# Patient Record
Sex: Female | Born: 1980 | Hispanic: Yes | Marital: Single | Smoking: Never smoker
Health system: Southern US, Community
[De-identification: ages and names within clinical notes are randomized; demographics above are authoritative.]

## PROBLEM LIST (undated history)

## (undated) DIAGNOSIS — O24419 Gestational diabetes mellitus in pregnancy, unspecified control: Secondary | ICD-10-CM

## (undated) DIAGNOSIS — N200 Calculus of kidney: Secondary | ICD-10-CM

---

## 2015-12-22 ENCOUNTER — Emergency Department
Admission: EM | Admit: 2015-12-22 | Discharge: 2015-12-22 | Disposition: A | Discharge status: Home / Self Care | Payer: Self-pay | Attending: Emergency Medicine | Admitting: Emergency Medicine

## 2015-12-22 ENCOUNTER — Encounter: Payer: Self-pay | Admitting: Emergency Medicine

## 2015-12-22 DIAGNOSIS — R42 Dizziness and giddiness: Secondary | ICD-10-CM | POA: Insufficient documentation

## 2015-12-22 DIAGNOSIS — R1110 Vomiting, unspecified: Secondary | ICD-10-CM | POA: Insufficient documentation

## 2015-12-22 HISTORY — DX: Calculus of kidney: N20.0

## 2015-12-22 LAB — COMPREHENSIVE METABOLIC PANEL
ALT: 17 U/L (ref 14–54)
ANION GAP: 6 (ref 5–15)
AST: 18 U/L (ref 15–41)
Albumin: 4 g/dL (ref 3.5–5.0)
Alkaline Phosphatase: 65 U/L (ref 38–126)
BILIRUBIN TOTAL: 0.6 mg/dL (ref 0.3–1.2)
BUN: 8 mg/dL (ref 6–20)
CO2: 27 mmol/L (ref 22–32)
Calcium: 8.6 mg/dL — ABNORMAL LOW (ref 8.9–10.3)
Chloride: 105 mmol/L (ref 101–111)
Creatinine, Ser: 0.66 mg/dL (ref 0.44–1.00)
Glucose, Bld: 137 mg/dL — ABNORMAL HIGH (ref 65–99)
POTASSIUM: 4 mmol/L (ref 3.5–5.1)
Sodium: 138 mmol/L (ref 135–145)
TOTAL PROTEIN: 7.3 g/dL (ref 6.5–8.1)

## 2015-12-22 LAB — URINALYSIS COMPLETE WITH MICROSCOPIC (ARMC ONLY)
Bilirubin Urine: NEGATIVE
GLUCOSE, UA: NEGATIVE mg/dL
Ketones, ur: NEGATIVE mg/dL
LEUKOCYTES UA: NEGATIVE
NITRITE: NEGATIVE
Protein, ur: 100 mg/dL — AB
SPECIFIC GRAVITY, URINE: 1.023 (ref 1.005–1.030)
pH: 5 (ref 5.0–8.0)

## 2015-12-22 LAB — CBC
HEMATOCRIT: 36.6 % (ref 35.0–47.0)
HEMOGLOBIN: 12.4 g/dL (ref 12.0–16.0)
MCH: 28.5 pg (ref 26.0–34.0)
MCHC: 34 g/dL (ref 32.0–36.0)
MCV: 84 fL (ref 80.0–100.0)
Platelets: 310 10*3/uL (ref 150–440)
RBC: 4.36 MIL/uL (ref 3.80–5.20)
RDW: 13.9 % (ref 11.5–14.5)
WBC: 8.1 10*3/uL (ref 3.6–11.0)

## 2015-12-22 LAB — POCT PREGNANCY, URINE: PREG TEST UR: NEGATIVE

## 2015-12-22 MED ORDER — DIAZEPAM 5 MG PO TABS: 5.0000 mg | ORAL_TABLET | Freq: Three times a day (TID) | ORAL | 0 refills | Status: DC | PRN

## 2015-12-22 MED ORDER — MECLIZINE HCL 25 MG PO TABS
50.0000 mg | ORAL_TABLET | Freq: Once | ORAL | Status: AC
  Administered 2015-12-22: 50 mg via ORAL
  Filled 2015-12-22: qty 2

## 2015-12-22 MED ORDER — ONDANSETRON 4 MG PO TBDP
4.0000 mg | ORAL_TABLET | Freq: Once | ORAL | Status: AC
  Administered 2015-12-22: 4 mg via ORAL
  Filled 2015-12-22: qty 1

## 2015-12-22 MED ORDER — DIAZEPAM 5 MG PO TABS
5.0000 mg | ORAL_TABLET | Freq: Once | ORAL | Status: AC
  Administered 2015-12-22: 5 mg via ORAL
  Filled 2015-12-22: qty 1

## 2015-12-22 MED ORDER — MECLIZINE HCL 25 MG PO TABS
25.0000 mg | ORAL_TABLET | Freq: Three times a day (TID) | ORAL | 1 refills | Status: DC | PRN
Start: 2015-12-22 — End: 2019-12-12

## 2015-12-22 NOTE — ED Notes (Signed)
No change in symptoms, pt waiting in lobby

## 2015-12-22 NOTE — ED Notes (Signed)
D/c done with interpreter at bedside  

## 2015-12-22 NOTE — ED Provider Notes (Signed)
[facility] Provider Note        Time seen: ----------------------------------------- 3:03 PM on 12/22/2015 -----------------------------------------    I have reviewed the triage vital signs and the nursing notes.   HISTORY  Chief Complaint Dizziness and Emesis    HPI Regina Morris is a 35 y.o. female who presents to ER for dizziness and vomiting. Patient continues to have spinning and she describes as dizziness. She vomited another time as well. She has had symptoms on and off for several days. Movement seems to make it worse. She denies recent illness, she had some upper abdominal pain with vomiting associated with this. She denies fevers or chills.   Past Medical History:  Diagnosis Date  . Kidney stone     There are no active problems to display for this patient.   No past surgical history on file.  Allergies Penicillins  Social History Social History  Substance Use Topics  . Smoking status: Never Smoker  . Smokeless tobacco: Never Used  . Alcohol use No    Review of Systems Constitutional: Negative for fever. Cardiovascular: Negative for chest pain. Respiratory: Negative for shortness of breath. Gastrointestinal: Negative for abdominal pain,Positive for vomiting Genitourinary: Negative for dysuria. Musculoskeletal: Negative for back pain. Skin: Negative for rash. Neurological: Negative for headaches, focal weakness or numbness.Positive for dizziness  10-point ROS otherwise negative.  ____________________________________________   PHYSICAL EXAM:  VITAL SIGNS: ED Triage Vitals  Enc Vitals Group     BP 12/22/15 1043 (!) 144/81     Pulse Rate 12/22/15 1043 (!) 59     Resp 12/22/15 1043 18     Temp 12/22/15 1043 98.4 F (36.9 C)     Temp Source 12/22/15 1043 Oral     SpO2 12/22/15 1043 99 %     Weight 12/22/15 1044 170 lb (77.1 kg)     Height 12/22/15 1044 5\' 8"  (1.727 m)     Head  Circumference --      Peak Flow --      Pain Score 12/22/15 1046 0     Pain Loc --      Pain Edu? --      Excl. in [place]? --     Constitutional: Alert and oriented. Well appearing and in no distress. Eyes: Conjunctivae are normal. PERRL. Normal extraocular movements. ENT   Head: Normocephalic and atraumatic.   Nose: No congestion/rhinnorhea.   Mouth/Throat: Mucous membranes are moist.   Neck: No stridor. Cardiovascular: Normal rate, regular rhythm. No murmurs, rubs, or gallops. Respiratory: Normal respiratory effort without tachypnea nor retractions. Breath sounds are clear and equal bilaterally. No wheezes/rales/rhonchi. Gastrointestinal: Soft and nontender. Normal bowel sounds Musculoskeletal: Nontender with normal range of motion in all extremities. No lower extremity tenderness nor edema. Neurologic:  Normal speech and language. No gross focal neurologic deficits are appreciated.  Skin:  Skin is warm, dry and intact. No rash noted. Psychiatric: Mood and affect are normal. Speech and behavior are normal.  ____________________________________________  ED COURSE:  Pertinent labs & imaging results that were available during my care of the patient were reviewed by me and considered in my medical decision making (see chart for details). Clinical Course  Patient is in no distress, clinically with vertigo. She will receive meclizine and Valium.  Procedures ____________________________________________   LABS (pertinent positives/negatives)  Labs Reviewed  COMPREHENSIVE METABOLIC PANEL - Abnormal; Notable for the following:       Result Value   Glucose, Bld 137 (*)  Calcium 8.6 (*)    All other components within normal limits  URINALYSIS COMPLETEWITH MICROSCOPIC ([facility] ONLY) - Abnormal; Notable for the following:    Color, Urine YELLOW (*)    APPearance CLOUDY (*)    Hgb urine dipstick 3+ (*)    Protein, ur 100 (*)    Bacteria, UA RARE (*)    Squamous Epithelial /  LPF 0-5 (*)    All other components within normal limits  CBC  POC URINE PREG, ED  POCT PREGNANCY, URINE   ____________________________________________  FINAL ASSESSMENT AND PLAN  Vertigo  Plan: Patient with labs as dictated above. Patient is in no distress, symptoms have improved. She'll be discharged with meclizine and Valium and referred to ENT for follow-up.   Emily Filbert, MD   Note: This dictation was prepared with Dragon dictation. Any transcriptional errors that result from this process are unintentional    Emily Filbert, MD 12/22/15 [phone]

## 2015-12-22 NOTE — ED Triage Notes (Signed)
Saturday started with dizziness and vomited.  Continues to have spinning dizziness. Vomited another time as well.

## 2018-12-26 ENCOUNTER — Emergency Department
Admission: EM | Admit: 2018-12-26 | Discharge: 2018-12-26 | Disposition: A | Discharge status: Home / Self Care | Payer: Self-pay | Attending: Emergency Medicine | Admitting: Emergency Medicine

## 2018-12-26 ENCOUNTER — Encounter: Payer: Self-pay | Admitting: *Deleted

## 2018-12-26 ENCOUNTER — Other Ambulatory Visit: Payer: Self-pay

## 2018-12-26 DIAGNOSIS — J069 Acute upper respiratory infection, unspecified: Secondary | ICD-10-CM | POA: Insufficient documentation

## 2018-12-26 DIAGNOSIS — F17200 Nicotine dependence, unspecified, uncomplicated: Secondary | ICD-10-CM | POA: Insufficient documentation

## 2018-12-26 DIAGNOSIS — Z20828 Contact with and (suspected) exposure to other viral communicable diseases: Secondary | ICD-10-CM | POA: Insufficient documentation

## 2018-12-26 MED ORDER — BENZONATATE 100 MG PO CAPS
100.0000 mg | ORAL_CAPSULE | Freq: Three times a day (TID) | ORAL | 0 refills | Status: AC | PRN
Start: 2018-12-26 — End: 2019-01-02

## 2018-12-26 NOTE — ED Notes (Signed)
E-signature not working at this time. Pt verbalized understanding of D/C instructions, prescriptions and follow up care with no further questions at this time. Pt in NAD and ambulatory at time of D/C.  

## 2018-12-26 NOTE — ED Provider Notes (Signed)
[facility] Emergency Department Provider Note  ____________________________________________  Time seen: Approximately 11:04 PM  I have reviewed the triage vital signs and the nursing notes.   HISTORY  Chief Complaint Cough    HPI Regina Morris is a 38 y.o. female presents to the emergency department with rhinorrhea, nasal congestion, sporadic, nonproductive cough and pharyngitis for the past 2 to 3 days.  Patient has numerous potential sick contacts.  She denies chest pain, chest tightness, shortness of breath or abdominal pain.  Increased work of breathing at home.  No other alleviating measures have been attempted.        Past Medical History:  Diagnosis Date  . Kidney stone     There are no active problems to display for this patient.    Prior to Admission medications   Medication Sig Start Date End Date Taking? Authorizing Provider  benzonatate (TESSALON PERLES) 100 MG capsule Take 1 capsule (100 mg total) by mouth 3 (three) times daily as needed for up to 7 days for cough. 12/26/18 01/02/19  Lannie Fields, PA-C  diazepam (VALIUM) 5 MG tablet Take 1 tablet (5 mg total) by mouth every 8 (eight) hours as needed (to be taken with meclizine for vertigo). 12/22/15   Earleen Newport, MD  meclizine (ANTIVERT) 25 MG tablet Take 1 tablet (25 mg total) by mouth 3 (three) times daily as needed for dizziness or nausea. 12/22/15   Earleen Newport, MD    Allergies Penicillins  No family history on file.  Social History Social History   Tobacco Use  . Smoking status: Current Every Day Smoker  . Smokeless tobacco: Never Used  Substance Use Topics  . Alcohol use: No  . Drug use: Not on file      Review of Systems  Constitutional: Patient has fever.  Eyes: No visual changes. No discharge ENT: Patient has congestion.  Cardiovascular: no chest pain. Respiratory: Patient has cough.  Gastrointestinal: No abdominal pain.  No nausea,  no vomiting. Patient had diarrhea.  Genitourinary: Negative for dysuria. No hematuria Musculoskeletal: Patient has myalgias.  Skin: Negative for rash, abrasions, lacerations, ecchymosis. Neurological: Patient has headache, no focal weakness or numbness.     ____________________________________________   PHYSICAL EXAM:  VITAL SIGNS: ED Triage Vitals  Enc Vitals Group     BP 12/26/18 2216 136/87     Pulse Rate 12/26/18 2216 91     Resp 12/26/18 2216 18     Temp 12/26/18 2216 98.4 F (36.9 C)     Temp Source 12/26/18 2216 Oral     SpO2 12/26/18 2216 100 %     Weight 12/26/18 2217 165 lb (74.8 kg)     Height 12/26/18 2217 5\' 4"  (1.626 m)     Head Circumference --      Peak Flow --      Pain Score 12/26/18 2217 6     Pain Loc --      Pain Edu? --      Excl. in [place]? --     Constitutional: Alert and oriented. Patient is lying supine. Eyes: Conjunctivae are normal. PERRL. EOMI. Head: Atraumatic. ENT:      Ears: Tympanic membranes are mildly injected with mild effusion bilaterally.       Nose: No congestion/rhinnorhea.      Mouth/Throat: Mucous membranes are moist. Posterior pharynx is mildly erythematous.  Hematological/Lymphatic/Immunilogical: No cervical lymphadenopathy.  Cardiovascular: Normal rate, regular rhythm. Normal S1 and S2.  Good peripheral  circulation. Respiratory: Normal respiratory effort without tachypnea or retractions. Lungs CTAB. Good air entry to the bases with no decreased or absent breath sounds. Gastrointestinal: Bowel sounds 4 quadrants. Soft and nontender to palpation. No guarding or rigidity. No palpable masses. No distention. No CVA tenderness. Musculoskeletal: Full range of motion to all extremities. No gross deformities appreciated. Neurologic:  Normal speech and language. No gross focal neurologic deficits are appreciated.  Skin:  Skin is warm, dry and intact. No rash noted. Psychiatric: Mood and affect are normal. Speech and behavior are normal.  Patient exhibits appropriate insight and judgement.    ____________________________________________   LABS (all labs ordered are listed, but only abnormal results are displayed)  Labs Reviewed  SARS CORONAVIRUS 2 (TAT 6-24 HRS)   ____________________________________________  EKG   ____________________________________________  RADIOLOGY   No results found.  ____________________________________________    PROCEDURES  Procedure(s) performed:    Procedures    Medications - No data to display   ____________________________________________   INITIAL IMPRESSION / ASSESSMENT AND PLAN / ED COURSE  Pertinent labs & imaging results that were available during my care of the patient were reviewed by me and considered in my medical decision making (see chart for details).  Review of the [place] CSRS was performed in accordance of the [facility] prior to dispensing any controlled drugs.           Assessment and plan Viral URI with cough 38 year old female presents to the emergency department with rhinorrhea, nasal congestion and sporadic nonproductive cough for the past 2 to 3 days.  Vital signs are reassuring at triage.  On physical exam, patient had no increased work of breathing.  No adventitious lung sounds were auscultated.  Differential diagnosis includes COVID-19 versus unspecified viral URI.  Rest and hydration were encouraged at home.  Patient was advised to take Tylenol and ibuprofen alternating for fever.  COVID-19 results are pending at this time.  Return precautions were given.  All patient questions were answered.  Regina Morris Texta was evaluated in Emergency Department on 12/26/2018 for the symptoms described in the history of present illness. She was evaluated in the context of the global COVID-19 pandemic, which necessitated consideration that the patient might be at risk for infection with the SARS-CoV-2 virus that causes COVID-19. Institutional protocols and  algorithms that pertain to the evaluation of patients at risk for COVID-19 are in a state of rapid change based on information released by regulatory bodies including the CDC and federal and state organizations. These policies and algorithms were followed during the patient's care in the ED.    ____________________________________________  FINAL CLINICAL IMPRESSION(S) / ED DIAGNOSES  Final diagnoses:  Viral URI with cough      NEW MEDICATIONS STARTED DURING THIS VISIT:  ED Discharge Orders         Ordered    benzonatate (TESSALON PERLES) 100 MG capsule  3 times daily PRN     12/26/18 2251              This chart was dictated using voice recognition software/Dragon. Despite best efforts to proofread, errors can occur which can change the meaning. Any change was purely unintentional.    Orvil Feil, PA-C 12/26/18 2306    Sharman Cheek, MD 12/31/18 [phone]

## 2018-12-26 NOTE — ED Triage Notes (Signed)
Pt has fever, cough and runny nose for 3 days.  Pt also reports a sore throat.  No sob.  cig smoker.    Pt alert.  Speech clear.

## 2018-12-27 LAB — SARS CORONAVIRUS 2 (TAT 6-24 HRS): SARS Coronavirus 2: NEGATIVE

## 2019-12-12 ENCOUNTER — Inpatient Hospital Stay
Admission: EM | Admit: 2019-12-12 | Discharge: 2019-12-15 | DRG: 872 | Disposition: A | Discharge status: Home / Self Care | Payer: Medicaid Other | Attending: Internal Medicine | Admitting: Internal Medicine

## 2019-12-12 ENCOUNTER — Emergency Department: Payer: Medicaid Other

## 2019-12-12 ENCOUNTER — Other Ambulatory Visit: Payer: Self-pay

## 2019-12-12 ENCOUNTER — Encounter: Payer: Self-pay | Admitting: Emergency Medicine

## 2019-12-12 DIAGNOSIS — E871 Hypo-osmolality and hyponatremia: Secondary | ICD-10-CM | POA: Diagnosis present

## 2019-12-12 DIAGNOSIS — N10 Acute tubulo-interstitial nephritis: Secondary | ICD-10-CM

## 2019-12-12 DIAGNOSIS — A419 Sepsis, unspecified organism: Secondary | ICD-10-CM | POA: Diagnosis present

## 2019-12-12 DIAGNOSIS — Z20822 Contact with and (suspected) exposure to covid-19: Secondary | ICD-10-CM | POA: Diagnosis present

## 2019-12-12 DIAGNOSIS — Z833 Family history of diabetes mellitus: Secondary | ICD-10-CM

## 2019-12-12 DIAGNOSIS — E876 Hypokalemia: Secondary | ICD-10-CM | POA: Diagnosis present

## 2019-12-12 DIAGNOSIS — Z880 Allergy status to penicillin: Secondary | ICD-10-CM

## 2019-12-12 DIAGNOSIS — D649 Anemia, unspecified: Secondary | ICD-10-CM | POA: Diagnosis present

## 2019-12-12 DIAGNOSIS — I10 Essential (primary) hypertension: Secondary | ICD-10-CM | POA: Diagnosis present

## 2019-12-12 DIAGNOSIS — E1165 Type 2 diabetes mellitus with hyperglycemia: Secondary | ICD-10-CM | POA: Diagnosis present

## 2019-12-12 DIAGNOSIS — R509 Fever, unspecified: Secondary | ICD-10-CM | POA: Diagnosis present

## 2019-12-12 DIAGNOSIS — Z87442 Personal history of urinary calculi: Secondary | ICD-10-CM

## 2019-12-12 DIAGNOSIS — Z8632 Personal history of gestational diabetes: Secondary | ICD-10-CM | POA: Diagnosis not present

## 2019-12-12 DIAGNOSIS — R739 Hyperglycemia, unspecified: Secondary | ICD-10-CM

## 2019-12-12 DIAGNOSIS — N12 Tubulo-interstitial nephritis, not specified as acute or chronic: Secondary | ICD-10-CM

## 2019-12-12 DIAGNOSIS — E878 Other disorders of electrolyte and fluid balance, not elsewhere classified: Secondary | ICD-10-CM

## 2019-12-12 HISTORY — DX: Gestational diabetes mellitus in pregnancy, unspecified control: O24.419

## 2019-12-12 LAB — URINALYSIS, COMPLETE (UACMP) WITH MICROSCOPIC
Bilirubin Urine: NEGATIVE
Glucose, UA: >=500 mg/dL — AB
Ketones, ur: NEGATIVE mg/dL
Nitrite: POSITIVE — AB
Protein, ur: >=300 mg/dL — AB
Specific Gravity, Urine: 1.021 (ref 1.005–1.030)
WBC, UA: >50 WBC/hpf — ABNORMAL HIGH (ref 0–5)
pH: 7 (ref 5.0–8.0)

## 2019-12-12 LAB — CBC WITH DIFFERENTIAL/PLATELET
Abs Immature Granulocytes: 0.05 10*3/uL (ref 0.00–0.07)
Basophils Absolute: 0 10*3/uL (ref 0.0–0.1)
Basophils Relative: 0 %
Eosinophils Absolute: 0 10*3/uL (ref 0.0–0.5)
Eosinophils Relative: 0 %
HCT: 28.9 % — ABNORMAL LOW (ref 36.0–46.0)
Hemoglobin: 8.7 g/dL — ABNORMAL LOW (ref 12.0–15.0)
Immature Granulocytes: 1 %
Lymphocytes Relative: 9 %
Lymphs Abs: 1 10*3/uL (ref 0.7–4.0)
MCH: 17.8 pg — ABNORMAL LOW (ref 26.0–34.0)
MCHC: 30.1 g/dL (ref 30.0–36.0)
MCV: 59.1 fL — ABNORMAL LOW (ref 80.0–100.0)
Monocytes Absolute: 0.7 10*3/uL (ref 0.1–1.0)
Monocytes Relative: 6 %
Neutro Abs: 9.2 10*3/uL — ABNORMAL HIGH (ref 1.7–7.7)
Neutrophils Relative %: 84 %
Platelets: 328 10*3/uL (ref 150–400)
RBC: 4.89 MIL/uL (ref 3.87–5.11)
RDW: 18 % — ABNORMAL HIGH (ref 11.5–15.5)
WBC: 10.9 10*3/uL — ABNORMAL HIGH (ref 4.0–10.5)
nRBC: 0 % (ref 0.0–0.2)

## 2019-12-12 LAB — POCT PREGNANCY, URINE: Preg Test, Ur: NEGATIVE

## 2019-12-12 LAB — COMPREHENSIVE METABOLIC PANEL
ALT: 18 U/L (ref 0–44)
AST: 24 U/L (ref 15–41)
Albumin: 2.9 g/dL — ABNORMAL LOW (ref 3.5–5.0)
Alkaline Phosphatase: 73 U/L (ref 38–126)
Anion gap: 11 (ref 5–15)
BUN: 8 mg/dL (ref 6–20)
CO2: 23 mmol/L (ref 22–32)
Calcium: 8.1 mg/dL — ABNORMAL LOW (ref 8.9–10.3)
Chloride: 97 mmol/L — ABNORMAL LOW (ref 98–111)
Creatinine, Ser: 0.81 mg/dL (ref 0.44–1.00)
GFR calc Af Amer: >60 mL/min (ref >60)
GFR calc non Af Amer: >60 mL/min (ref >60)
Glucose, Bld: 312 mg/dL — ABNORMAL HIGH (ref 70–99)
Potassium: 3.1 mmol/L — ABNORMAL LOW (ref 3.5–5.1)
Sodium: 131 mmol/L — ABNORMAL LOW (ref 135–145)
Total Bilirubin: 0.5 mg/dL (ref 0.3–1.2)
Total Protein: 6.7 g/dL (ref 6.5–8.1)

## 2019-12-12 LAB — LACTIC ACID, PLASMA
Lactic Acid, Venous: 1.7 mmol/L (ref 0.5–1.9)
Lactic Acid, Venous: 1.1 mmol/L (ref 0.5–1.9)

## 2019-12-12 LAB — RESPIRATORY PANEL BY RT PCR (FLU A&B, COVID)
Influenza A by PCR: NEGATIVE
Influenza B by PCR: NEGATIVE
SARS Coronavirus 2 by RT PCR: NEGATIVE

## 2019-12-12 LAB — TROPONIN I (HIGH SENSITIVITY)
Troponin I (High Sensitivity): 8 ng/L (ref <18)
Troponin I (High Sensitivity): 8 ng/L (ref <18)

## 2019-12-12 LAB — FOLATE: Folate: 15.4 ng/mL (ref >5.9)

## 2019-12-12 LAB — IRON AND TIBC
Iron: 7 ug/dL — ABNORMAL LOW (ref 28–170)
Saturation Ratios: 2 % — ABNORMAL LOW (ref 10.4–31.8)
TIBC: 343 ug/dL (ref 250–450)
UIBC: 336 ug/dL

## 2019-12-12 LAB — GLUCOSE, CAPILLARY: Glucose-Capillary: 274 mg/dL — ABNORMAL HIGH (ref 70–99)

## 2019-12-12 LAB — FERRITIN: Ferritin: 13 ng/mL (ref 11–307)

## 2019-12-12 IMAGING — DX DG CHEST 1V PORT
1 series · 1 of 1 positions shown · non-contrast
Comparison: None.

CLINICAL DATA: Cough and fever

EXAM:
PORTABLE CHEST 1 VIEW

[chest ap]
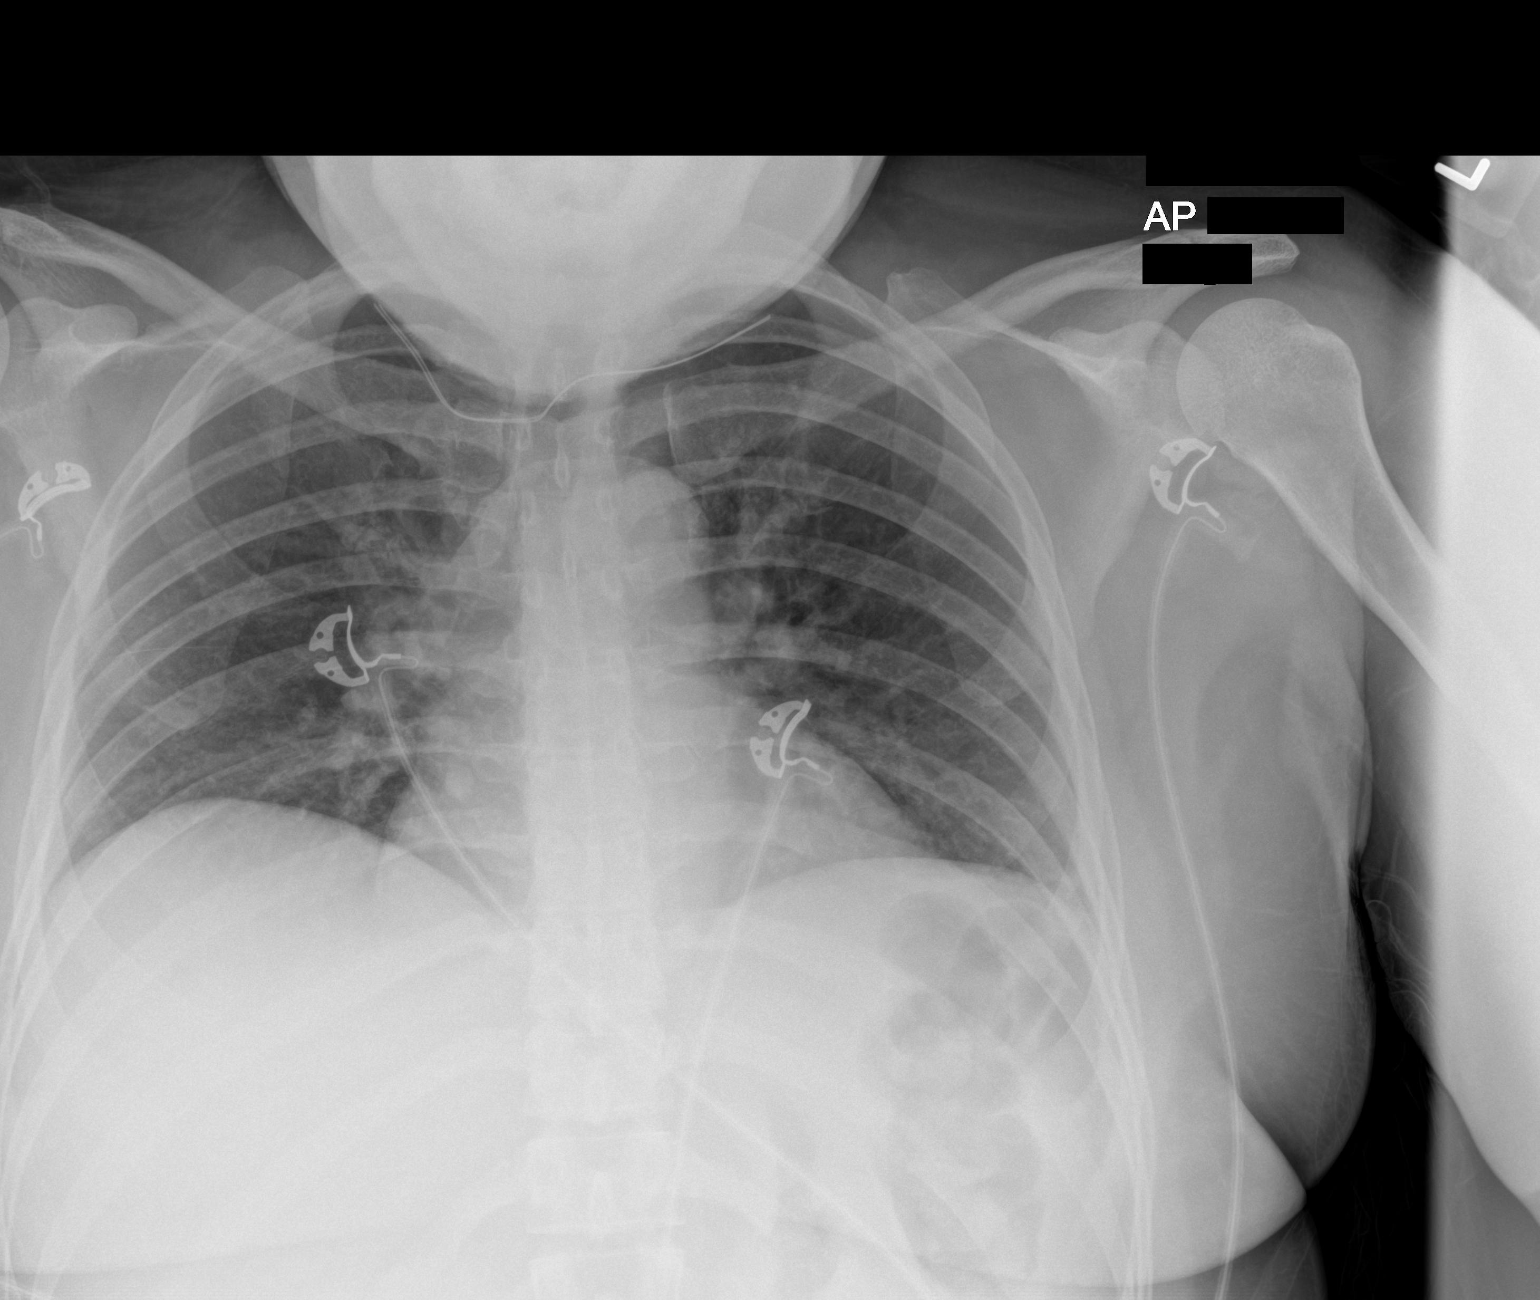

[1 of 1 positions shown; findings below may reference images not displayed]

FINDINGS: Diminished lung volumes. No focal consolidation, pleural effusion or
pneumothorax. Normal cardiomediastinal silhouette
IMPRESSION: Low lung volumes. No focal pulmonary infiltrate.

## 2019-12-12 IMAGING — CT CT RENAL STONE PROTOCOL
3 of 4 series · 10 of 46 positions shown, 15 images · non-contrast
Comparison: None.

CLINICAL DATA: Flank pain, stone disease suspected, pregnant
patient

EXAM:
CT ABDOMEN AND PELVIS WITHOUT CONTRAST
TECHNIQUE: Multidetector CT imaging of the abdomen and pelvis was performed
following the standard protocol without IV contrast.

[Series 2: lung bases · axial · 0.79mm/px · z∈[-60,+55]mm · 6 of 33 slices shown, 11 images]
[im 5/33  soft-tissue]
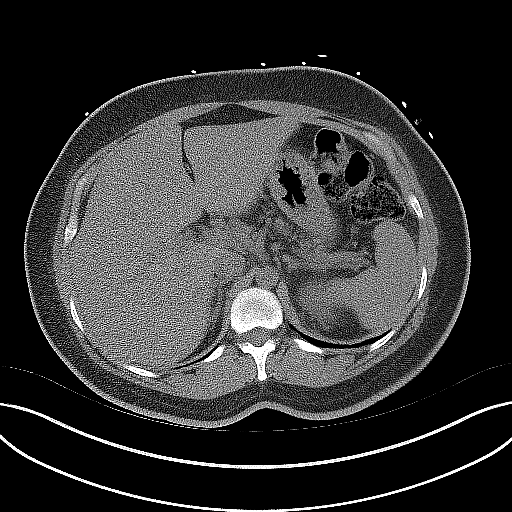
[im 5/33  bone]
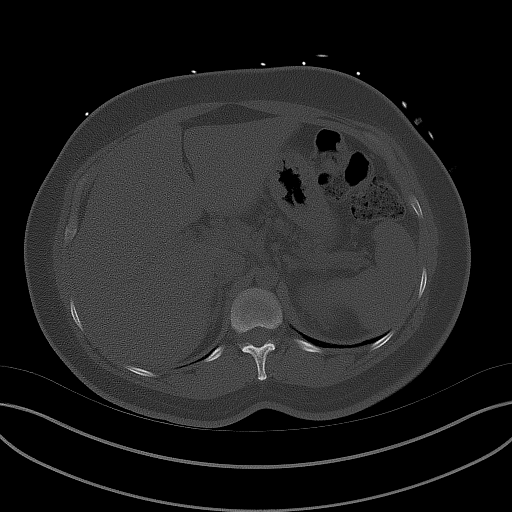
[im 10/33  soft-tissue]
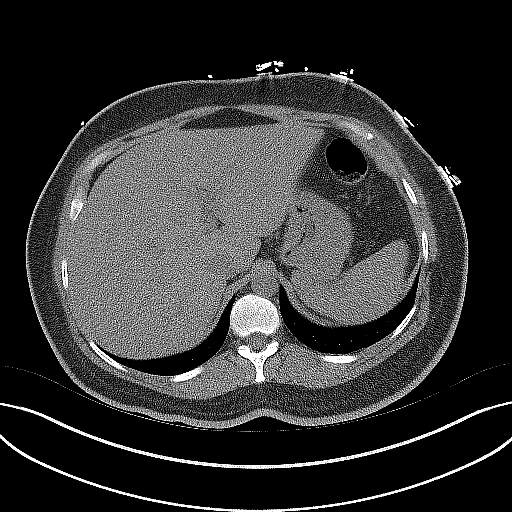
[im 14/33  soft-tissue]
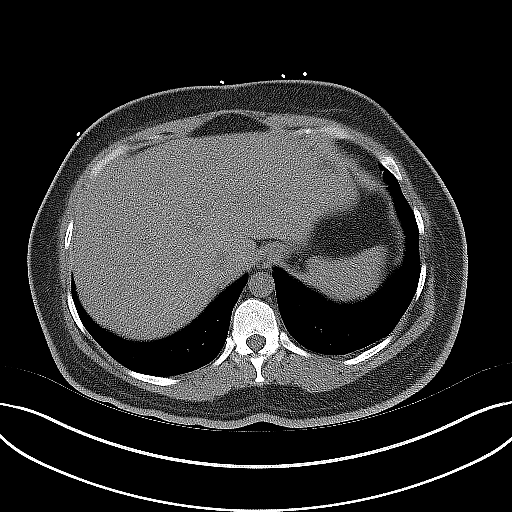
[im 14/33  lung]
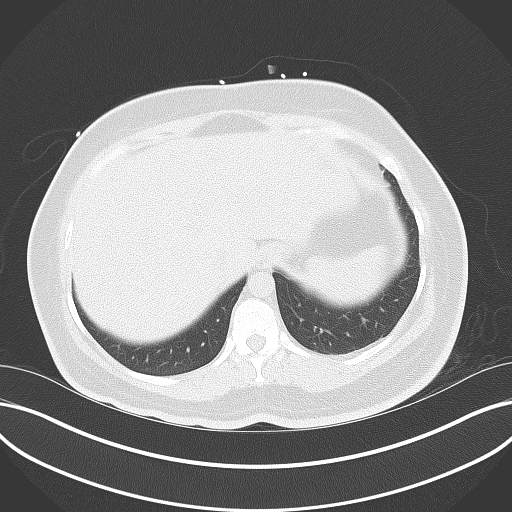
[im 19/33  soft-tissue]
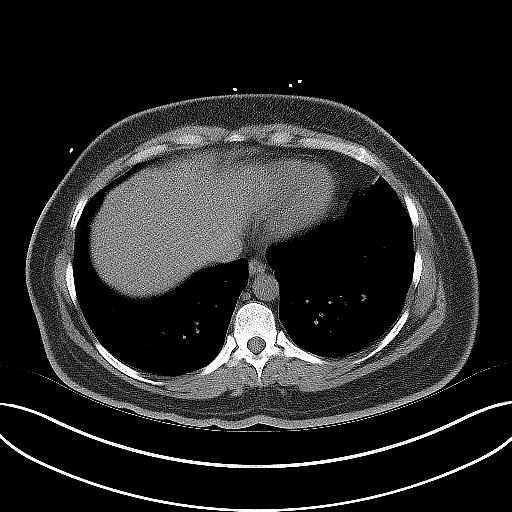
[im 19/33  lung]
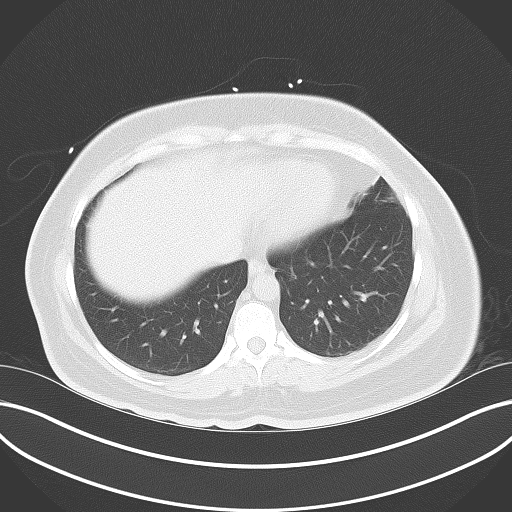
[im 23/33  soft-tissue]
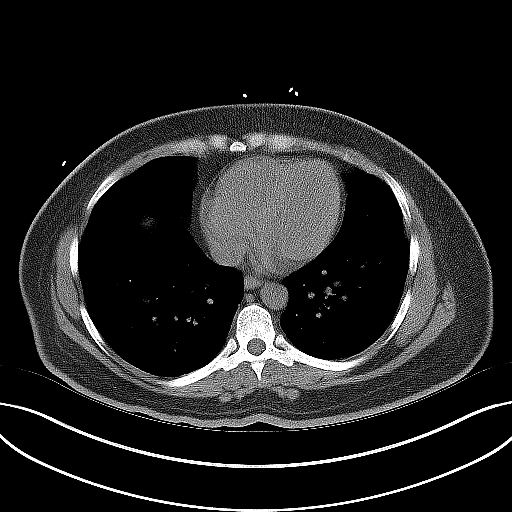
[im 23/33  lung]
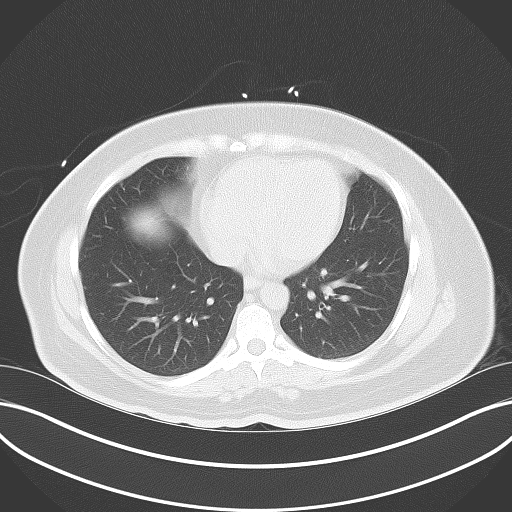
[im 28/33  soft-tissue]
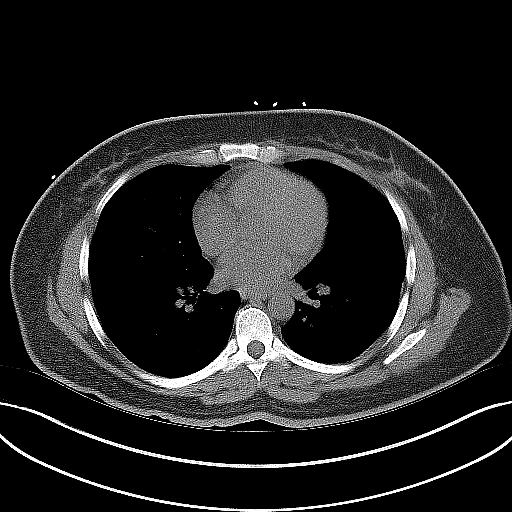
[im 28/33  lung]
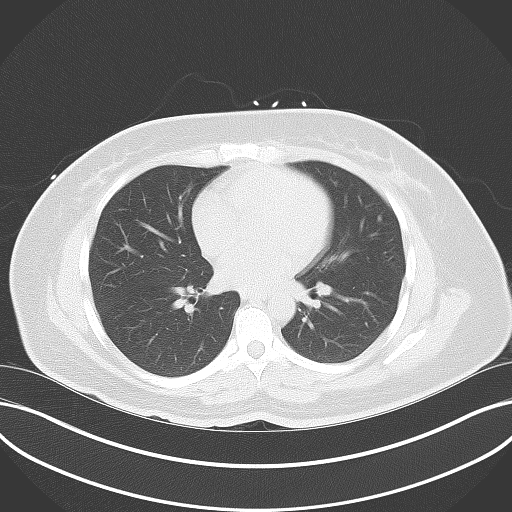

[Series 5: coronal · coronal · 0.83mm/px · 3 of 150 slices shown]
[im 50/150  soft-tissue]
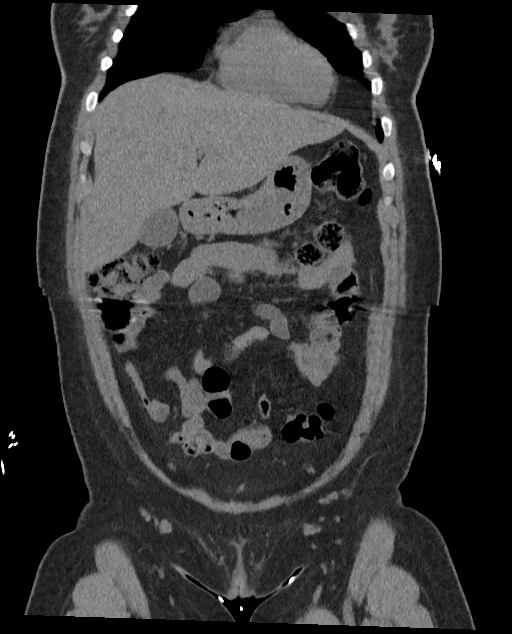
[im 67/150  soft-tissue]
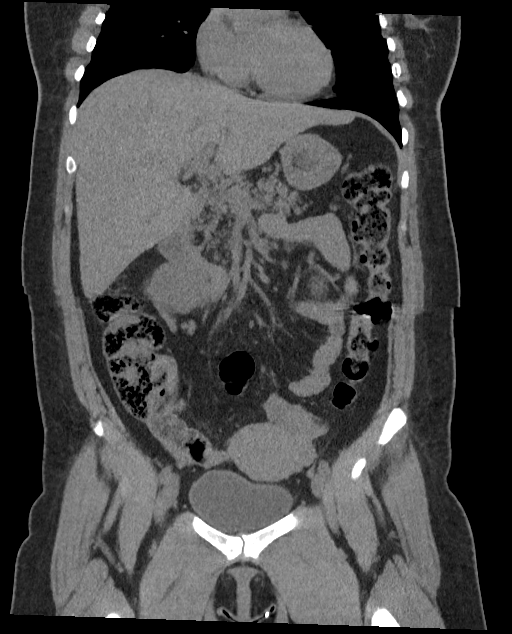
[im 83/150  soft-tissue]
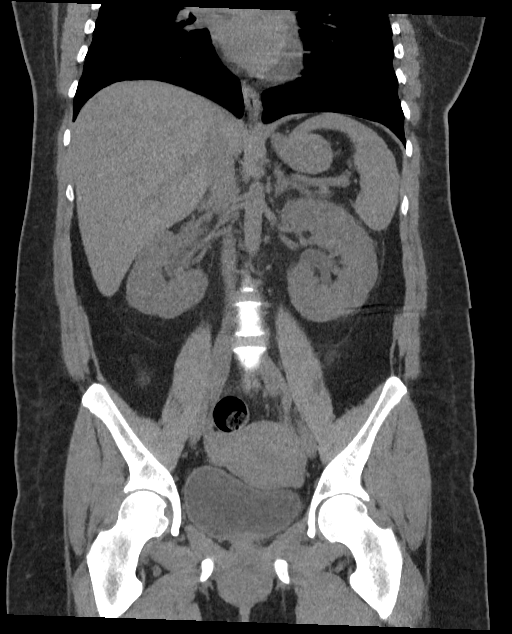

[Series 6: sagittal · sagittal · 0.58mm/px · 1 of 212 slices shown]
[im 71/212  soft-tissue]
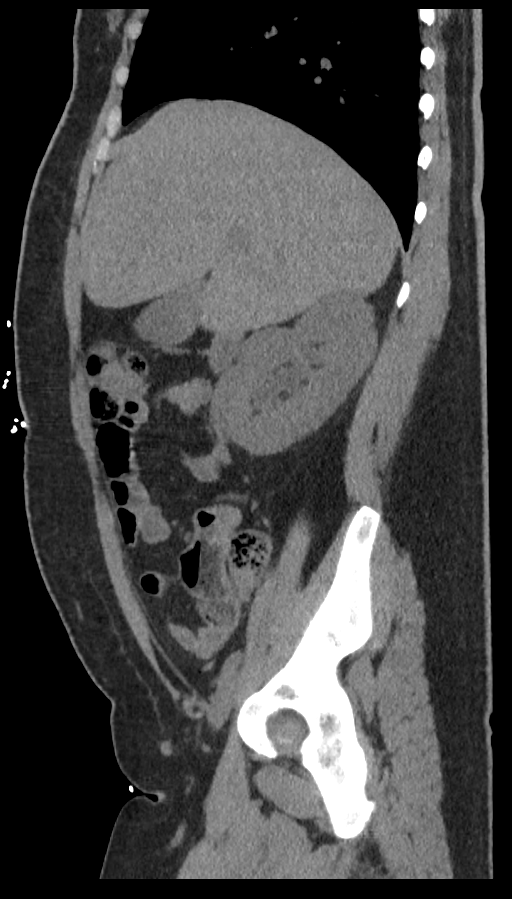

[10 of 46 positions shown; findings below may reference images not displayed]

FINDINGS: Lower chest: Lung bases are clear. Normal heart size. No pericardial
effusion.

Hepatobiliary: No visible focal liver lesions on this unenhanced CT.
Smooth liver surface contour. Normal hepatic attenuation.
Gallbladder and biliary tree are unremarkable.

Pancreas: Partial fatty replacement of the pancreas. No pancreatic
ductal dilatation or surrounding inflammatory changes.

Spleen: Normal in size. No concerning splenic lesions.

Adrenals/Urinary Tract: Normal adrenal glands. No visible or contour
deforming renal lesions are seen. Bilateral perinephric stranding
with a mildly edematous appearance of both kidneys. There is slight
prominence of the collecting systems bilaterally without visible
calculus or frank hydronephrosis. Mild circumferential bladder wall
thickening and perivesicular hazy stranding is noted as well. No
visible bladder calculi or debris.

Stomach/Bowel: Distal esophagus, stomach and duodenal sweep are
unremarkable. No small bowel wall thickening or dilatation. No
evidence of obstruction. A normal appendix is visualized. No colonic
dilatation or wall thickening.

Vascular/Lymphatic: No significant vascular findings are present.
Few slightly prominent clustered though nonenlarged lymph nodes are
present in the right lower quadrant. No pathologically enlarged
nodes are present in the abdomen or pelvis.

Reproductive: Normal appearance of the uterus and adnexal
structures.

Other: No abdominopelvic free fluid or free gas. No bowel containing
hernias.

Musculoskeletal: No acute osseous abnormality or suspicious osseous
lesion.
IMPRESSION: 1. Bilateral perinephric stranding with a mildly edematous
appearance of both kidneys. There is slight prominence of the
collecting systems bilaterally without visible calculus or frank
hydronephrosis. Mild circumferential bladder wall thickening and
perivesicular hazy stranding is noted as well. Findings are
concerning for ascending urinary tract infection/pyelonephritis.
Correlate with urinalysis.
2. Few slightly prominent clustered though nonenlarged lymph nodes
in the right lower quadrant, nonspecific, though possibly reactive
related to mesenteric adenitis.
3. Normal appendix.

## 2019-12-12 MED ORDER — ACETAMINOPHEN 325 MG PO TABS
650.0000 mg | ORAL_TABLET | Freq: Four times a day (QID) | ORAL | Status: DC | PRN
  Administered 2019-12-13: 650 mg via ORAL
  Filled 2019-12-12: qty 2

## 2019-12-12 MED ORDER — POTASSIUM CHLORIDE IN NACL 40-0.9 MEQ/L-% IV SOLN
INTRAVENOUS | Status: DC
  Filled 2019-12-12 (×5): qty 1000

## 2019-12-12 MED ORDER — ONDANSETRON HCL 4 MG/2ML IJ SOLN: 4.0000 mg | Freq: Four times a day (QID) | INTRAMUSCULAR | Status: DC | PRN

## 2019-12-12 MED ORDER — INSULIN ASPART 100 UNIT/ML ~~LOC~~ SOLN
0.0000 [IU] | Freq: Every day | SUBCUTANEOUS | Status: DC
  Administered 2019-12-13: 3 [IU] via SUBCUTANEOUS
  Administered 2019-12-13 – 2019-12-14 (×2): 4 [IU] via SUBCUTANEOUS
  Filled 2019-12-12 (×3): qty 1

## 2019-12-12 MED ORDER — LACTATED RINGERS IV BOLUS
2000.0000 mL | Freq: Once | INTRAVENOUS | Status: AC
  Administered 2019-12-12: 2000 mL via INTRAVENOUS

## 2019-12-12 MED ORDER — MORPHINE SULFATE (PF) 2 MG/ML IV SOLN: 2.0000 mg | INTRAVENOUS | Status: DC | PRN

## 2019-12-12 MED ORDER — KETOROLAC TROMETHAMINE 30 MG/ML IJ SOLN: 30.0000 mg | Freq: Four times a day (QID) | INTRAMUSCULAR | Status: DC | PRN

## 2019-12-12 MED ORDER — SODIUM CHLORIDE 0.9 % IV SOLN
1.0000 g | Freq: Once | INTRAVENOUS | Status: AC
  Administered 2019-12-12: 1 g via INTRAVENOUS
  Filled 2019-12-12: qty 10

## 2019-12-12 MED ORDER — LACTATED RINGERS IV BOLUS (SEPSIS): 1000.0000 mL | Freq: Once | INTRAVENOUS | Status: DC

## 2019-12-12 MED ORDER — ACETAMINOPHEN 650 MG RE SUPP: 650.0000 mg | Freq: Four times a day (QID) | RECTAL | Status: DC | PRN

## 2019-12-12 MED ORDER — LACTATED RINGERS IV BOLUS (SEPSIS)
500.0000 mL | Freq: Once | INTRAVENOUS | Status: AC
  Administered 2019-12-13: 500 mL via INTRAVENOUS

## 2019-12-12 MED ORDER — ACETAMINOPHEN 325 MG PO TABS
650.0000 mg | ORAL_TABLET | Freq: Once | ORAL | Status: DC
  Filled 2019-12-12: qty 2

## 2019-12-12 MED ORDER — HYDROCODONE-ACETAMINOPHEN 5-325 MG PO TABS: 1.0000 | ORAL_TABLET | ORAL | Status: DC | PRN

## 2019-12-12 MED ORDER — SENNOSIDES-DOCUSATE SODIUM 8.6-50 MG PO TABS: 1.0000 | ORAL_TABLET | Freq: Every evening | ORAL | Status: DC | PRN

## 2019-12-12 MED ORDER — ENOXAPARIN SODIUM 40 MG/0.4ML ~~LOC~~ SOLN
40.0000 mg | SUBCUTANEOUS | Status: DC
  Administered 2019-12-13: 40 mg via SUBCUTANEOUS
  Filled 2019-12-12 (×2): qty 0.4

## 2019-12-12 MED ORDER — ACETAMINOPHEN 500 MG PO TABS
1000.0000 mg | ORAL_TABLET | Freq: Once | ORAL | Status: AC
  Administered 2019-12-12: 1000 mg via ORAL
  Filled 2019-12-12: qty 2

## 2019-12-12 MED ORDER — ONDANSETRON HCL 4 MG PO TABS: 4.0000 mg | ORAL_TABLET | Freq: Four times a day (QID) | ORAL | Status: DC | PRN

## 2019-12-12 MED ORDER — SODIUM CHLORIDE 0.9 % IV SOLN
1.0000 g | INTRAVENOUS | Status: DC
  Administered 2019-12-13 – 2019-12-14 (×2): 1 g via INTRAVENOUS
  Filled 2019-12-12: qty 10
  Filled 2019-12-12 (×2): qty 1

## 2019-12-12 MED ORDER — INSULIN ASPART 100 UNIT/ML ~~LOC~~ SOLN
0.0000 [IU] | Freq: Three times a day (TID) | SUBCUTANEOUS | Status: DC
  Administered 2019-12-13: 8 [IU] via SUBCUTANEOUS
  Administered 2019-12-13: 3 [IU] via SUBCUTANEOUS
  Administered 2019-12-13 – 2019-12-14 (×2): 5 [IU] via SUBCUTANEOUS
  Administered 2019-12-14 – 2019-12-15 (×3): 3 [IU] via SUBCUTANEOUS
  Filled 2019-12-12 (×7): qty 1

## 2019-12-12 NOTE — ED Triage Notes (Signed)
Patient ambulatory to triage with steady gait, without difficulty, appears uncomfortable; reports x 5 days having fever, lower abd/back pain with N/V and urinary frequency; motrin taken last at 3pm

## 2019-12-12 NOTE — ED Notes (Signed)
poct pregnancy Negative 

## 2019-12-12 NOTE — H&P (Addendum)
History and Physical    Regina Morris Texta VAP:[id number] DOB: 03-Feb-1981 DOA: 12/12/2019  PCP: Patient, No Pcp Per   Patient coming from: Home  I have personally briefly reviewed patient's [facility]  Chief Complaint: Fever, nausea and vomiting abdominal pain,  HPI: Regina Morris is a 39 y.o. female with medical history significant for gestational diabetes and hypertension and nephrolithiasis, who presents to the emergency room with a 5-day history of fever low back pain radiating to the lower abdomen, nausea and vomiting as well as urinary frequency.  She denied cough or shortness of breath.  Denies change in bowel habits.  Denies chest pain.  Pain is described as crampy of severe intensity radiating from low back bilaterally to suprapubic area.  Has been using Motrin without improvement. ED Course: On arrival in the emergency room, patient appeared uncomfortable related to pain.  Vitals significant for temperature 102.6, tachycardia of 130, BP 120/70 with normal respiratory rate with pain 10 out of 10.  Blood work significant [place]BC of 14388 with normal lactic acid of 1.7.  Hemoglobin 8.7, baseline unknown.  Last hemoglobin on file from 3 years prior of 12.4.  Urinalysis strongly positive.  Other abnormalities included blood sugar of 312 with electrolyte disturbance to include hyponatremia of 131 and potassium of 3.1.  Chest x-rayShowed no acute findings.  CT abdomen and pelvis renal stone protocol with findings concerning for acute pyelonephritis with findings of bilateral perinephric stranding.  No calculus noted on exam EKG as reviewed by me : Sinus tachycardia Patient started on antibiotics.  Hospitalist consulted for admission.  Code sepsis initiated on admission  Review of Systems: As per HPI otherwise all other systems on review of systems negative.    Past Medical History:  Diagnosis Date  . Kidney stone     History reviewed. No pertinent  surgical history.   reports that she has never smoked. She has never used smokeless tobacco. She reports that she does not drink alcohol. No history on file for drug use.  Allergies  Allergen Reactions  . Penicillins Rash    Family History  Problem Relation Age of Onset  . Uterine cancer Mother   . Diabetes Maternal Grandfather       Prior to Admission medications   Not on File    Physical Exam: Vitals:   12/12/19 1936 12/12/19 2021 12/12/19 2155  BP: 123/61    Pulse: (!) 147    Resp: (!) 22    Temp: (!) 104.5 F (40.3 C) (!) 102.6 F (39.2 C) 98.8 F (37.1 C)  TempSrc: Oral Oral Oral  SpO2: 97%       Vitals:   12/12/19 1936 12/12/19 2021 12/12/19 2155  BP: 123/61    Pulse: (!) 147    Resp: (!) 22    Temp: (!) 104.5 F (40.3 C) (!) 102.6 F (39.2 C) 98.8 F (37.1 C)  TempSrc: Oral Oral Oral  SpO2: 97%        Constitutional: Alert and oriented x 3 .  In mild pain discomfort HEENT:      Head: Normocephalic and atraumatic.         Eyes: PERLA, EOMI, Conjunctivae are normal. Sclera is non-icteric.       Mouth/Throat: Mucous membranes are moist.       Neck: Supple with no signs of meningismus. Cardiovascular: Regular. No murmurs, gallops, or rubs. 2+ symmetrical distal pulses are present . No JVD. No LE  edema Respiratory: Respiratory effort normal .Lungs sounds clear bilaterally. No wheezes, crackles, or rhonchi.  Gastrointestinal: Soft, left and right flank, and non distended with positive bowel sounds. No rebound or guarding. Genitourinary:  Left CVA tenderness. Musculoskeletal: Nontender with normal range of motion in all extremities. No cyanosis, or erythema of extremities. Neurologic:  Face is symmetric. Moving all extremities. No gross focal neurologic deficits . Skin: Skin is warm, dry.  No rash or ulcers Psychiatric: Mood and affect are normal    Labs on Admission: I have personally reviewed following labs and imaging studies  CBC: Recent Labs    Lab 12-22-2019 2031  WBC 10.9*  NEUTROABS 9.2*  HGB 8.7*  HCT 28.9*  MCV 59.1*  PLT 328   Basic Metabolic Panel: Recent Labs  Lab 2019/12/22 2031  NA 131*  K 3.1*  CL 97*  CO2 23  GLUCOSE 312*  BUN 8  CREATININE 0.81  CALCIUM 8.1*   GFR: CrCl cannot be calculated (Unknown ideal weight.). Liver Function Tests: Recent Labs  Lab 22-Dec-2019 2031  AST 24  ALT 18  ALKPHOS 73  BILITOT 0.5  PROT 6.7  ALBUMIN 2.9*   No results for input(s): LIPASE, AMYLASE in the last 168 hours. No results for input(s): AMMONIA in the last 168 hours. Coagulation Profile: No results for input(s): INR, PROTIME in the last 168 hours. Cardiac Enzymes: No results for input(s): CKTOTAL, CKMB, CKMBINDEX, TROPONINI in the last 168 hours. BNP (last 3 results) No results for input(s): PROBNP in the last 8760 hours. HbA1C: No results for input(s): HGBA1C in the last 72 hours. CBG: No results for input(s): GLUCAP in the last 168 hours. Lipid Profile: No results for input(s): CHOL, HDL, LDLCALC, TRIG, CHOLHDL, LDLDIRECT in the last 72 hours. Thyroid Function Tests: No results for input(s): TSH, T4TOTAL, FREET4, T3FREE, THYROIDAB in the last 72 hours. Anemia Panel: No results for input(s): VITAMINB12, FOLATE, FERRITIN, TIBC, IRON, RETICCTPCT in the last 72 hours. Urine analysis:    Component Value Date/Time   COLORURINE YELLOW (A) December 22, 2019 1930   APPEARANCEUR CLOUDY (A) 12/22/2019 1930   LABSPEC 1.021 2019/12/22 1930   PHURINE 7.0 December 22, 2019 1930   GLUCOSEU >=500 (A) 12-22-19 1930   HGBUR SMALL (A) 12/22/2019 1930   BILIRUBINUR NEGATIVE 2019/12/22 1930   KETONESUR NEGATIVE December 22, 2019 1930   PROTEINUR >=300 (A) 12/22/2019 1930   NITRITE POSITIVE (A) 12-22-19 1930   LEUKOCYTESUR MODERATE (A) Dec 22, 2019 1930    Radiological Exams on Admission: DG Chest Portable 1 View  Result Date: 12-22-19 CLINICAL DATA:  Cough and fever EXAM: PORTABLE CHEST 1 VIEW COMPARISON:  None. FINDINGS:  Diminished lung volumes. No focal consolidation, pleural effusion or pneumothorax. Normal cardiomediastinal silhouette IMPRESSION: Low lung volumes. No focal pulmonary infiltrate. Electronically Signed   By: Jasmine Pang M.D.   On: 2019/12/22 20:53   CT Renal Stone Study  Result Date: 12-22-2019 CLINICAL DATA:  Flank pain, stone disease suspected, pregnant patient EXAM: CT ABDOMEN AND PELVIS WITHOUT CONTRAST TECHNIQUE: Multidetector CT imaging of the abdomen and pelvis was performed following the standard protocol without IV contrast. COMPARISON:  None. FINDINGS: Lower chest: Lung bases are clear. Normal heart size. No pericardial effusion. Hepatobiliary: No visible focal liver lesions on this unenhanced CT. Smooth liver surface contour. Normal hepatic attenuation. Gallbladder and biliary tree are unremarkable. Pancreas: Partial fatty replacement of the pancreas. No pancreatic ductal dilatation or surrounding inflammatory changes. Spleen: Normal in size. No concerning splenic lesions. Adrenals/Urinary Tract: Normal adrenal glands. No visible or contour  deforming renal lesions are seen. Bilateral perinephric stranding with a mildly edematous appearance of both kidneys. There is slight prominence of the collecting systems bilaterally without visible calculus or frank hydronephrosis. Mild circumferential bladder wall thickening and perivesicular hazy stranding is noted as well. No visible bladder calculi or debris. Stomach/Bowel: Distal esophagus, stomach and duodenal sweep are unremarkable. No small bowel wall thickening or dilatation. No evidence of obstruction. A normal appendix is visualized. No colonic dilatation or wall thickening. Vascular/Lymphatic: No significant vascular findings are present. Few slightly prominent clustered though nonenlarged lymph nodes are present in the right lower quadrant. No pathologically enlarged nodes are present in the abdomen or pelvis. Reproductive: Normal appearance of the  uterus and adnexal structures. Other: No abdominopelvic free fluid or free gas. No bowel containing hernias. Musculoskeletal: No acute osseous abnormality or suspicious osseous lesion. IMPRESSION: 1. Bilateral perinephric stranding with a mildly edematous appearance of both kidneys. There is slight prominence of the collecting systems bilaterally without visible calculus or frank hydronephrosis. Mild circumferential bladder wall thickening and perivesicular hazy stranding is noted as well. Findings are concerning for ascending urinary tract infection/pyelonephritis. Correlate with urinalysis. 2. Few slightly prominent clustered though nonenlarged lymph nodes in the right lower quadrant, nonspecific, though possibly reactive related to mesenteric adenitis. 3. Normal appendix. Electronically Signed   By: Kreg Shropshire M.D.   On: 12/12/2019 21:42     Assessment/Plan 39 year old female with history of gestational diabetes and pregnancy-induced hypertension, presenting with a 5-day history of fever low back pain radiating to the lower abdomen, nausea and vomiting as well as urinary frequency.      Acute pyelonephritis   Sepsis (HCC) -Sepsis criteria includes fever of 102.6, tach he had 130, [id number] with CT renal stone protocol showing bilateral perinephric stranding consistent with pyelonephritis.  Lactic acid 1.7 -Code sepsis initiated -IV fluid bolus -Rocephin -Follow cultures    Hyperglycemia, suspect new onset diabetes   History of gestational diabetes mellitus, not pregnant -Blood sugar 312 -Sliding scale insulin coverage -Follow A1c to evaluate for new onset diabetes in view of past history -In anticipation of a diabetes diagnosis, diabetic educator consult   Electrolyte disturbance: Hyponatremia and hypokalemia -Secondary to nausea and vomiting -IV supplementation along with hydration  Anemia of uncertain acuity, suspect chronic -Hemoglobin 8.7.  Last hemoglobin 9.4 at [facility]  12/2018 -Suspect iron deficiency, with MCV of 59 -Trend hemoglobin -Anemia panel and consider stool for occult blood    DVT prophylaxis: Lovenox  Code Status: full code  Family Communication: Husband at bedside Disposition Plan: Back to previous home environment Consults called: none  Status:At the time of admission, it appears that the appropriate admission status for this patient is INPATIENT. This is judged to be reasonable and necessary in order to provide the required intensity of service to ensure the patient's safety given the presenting symptoms, physical exam findings, and initial radiographic and laboratory data in the context of their  Comorbid conditions.   Patient requires inpatient status due to high intensity of service, high risk for further deterioration and high frequency of surveillance required.   I certify that at the point of admission it is my clinical judgment that the patient will require inpatient hospital care spanning beyond 2 midnights     Andris Baumann MD [facility]     12/12/2019, 10:18 PM

## 2019-12-12 NOTE — Progress Notes (Signed)
CODE SEPSIS - PHARMACY COMMUNICATION  **Broad Spectrum Antibiotics should be administered within 1 hour of Sepsis diagnosis**  Time Code Sepsis Called/Page Received: 2215  Antibiotics Ordered: ceftriaxone 1 g q24h  Time of 1st antibiotic administration: 2026  Additional action taken by pharmacy: n/a ; antibiotics already administered  Tressie Ellis 12/12/2019  10:22 PM

## 2019-12-12 NOTE — ED Provider Notes (Addendum)
[facility] Emergency Department Provider Note   ____________________________________________   First MD Initiated Contact with Patient 12/12/19 1950     (approximate)  I have reviewed the triage vital signs and the nursing notes.   HISTORY  Chief Complaint No chief complaint on file.  Chief complaint is fever and back pain  HPI Regina Morris is a 39 y.o. female who comes in.  The first thing I need to say is that the sepsis order set will not open and functioning properly today.  He will not let me put in the bolus or maintenance fluids.  I cannot find the antibiotics to order today.  I am not using the sepsis order set for that reason.  Dr. Scotty CourtStafford then comes and finds the place to put in the fluids and antibiotics properly for me however everything is already and some stomach and to use the sepsis order set.  This wasted about 10 minutes of precious time for the septic patient.   Patient says that she bent over cleaning her house on Thursday.  She got some pain in her back that radiated around to her belly.  That pain lasted for part of the day and then resolved.  She has however since that time had a fever.  She had a little bit of a cough.  She has had some pain in the back intermittently.  She has had some dysuria intermittently she has some now.  She has a very mild sore throat as well.  Most of her complaint though is the fever.  She notes that she did have a kidney stone when she was pregnant.  She does not know if it ever passed she does not think so.      Past Medical History:  Diagnosis Date  . Kidney stone     Patient Active Problem List   Diagnosis Date Noted  . Acute pyelonephritis 12/12/2019  . Sepsis (HCC) 12/12/2019  . Hyperglycemia 12/12/2019  . Anemia, unspecified 12/12/2019  . Electrolyte disturbance 12/12/2019  . History of gestational diabetes mellitus, not pregnant 03/27/2012    History reviewed. No pertinent surgical  history.  Prior to Admission medications   Not on File    Allergies Penicillins  No family history on file.  Social History Social History   Tobacco Use  . Smoking status: Never Smoker  . Smokeless tobacco: Never Used  Vaping Use  . Vaping Use: Never used  Substance Use Topics  . Alcohol use: No  . Drug use: Not on file    Review of Systems  Constitutional:  fever/chills Eyes: No visual changes. ENT: Occasional mild sore throat. Cardiovascular: Denies chest pain. Respiratory: Denies shortness of breath. Gastrointestinal: Some abdominal pain but not right now.  No nausea, no vomiting.  No diarrhea.  No constipation. Genitourinary: Negative for dysuria. Musculoskeletal: Some back pain but not right now. Skin: Negative for rash. Neurological: Negative for headaches, focal weakness  ____________________________________________   PHYSICAL EXAM:  VITAL SIGNS: ED Triage Vitals  Enc Vitals Group     BP 12/12/19 1936 123/61     Pulse Rate 12/12/19 1936 (!) 147     Resp 12/12/19 1936 (!) 22     Temp 12/12/19 1936 (!) 104.5 F (40.3 C)     Temp Source 12/12/19 1936 Oral     SpO2 12/12/19 1936 97 %     Weight --      Height --      Head Circumference --  Peak Flow --      Pain Score 12/12/19 1939 10     Pain Loc --      Pain Edu? --      Excl. in GC? --     Constitutional: Alert and oriented. Well appearing and in no acute distress. Eyes: Conjunctivae are normal. PER. EOMI. Head: Atraumatic. Nose: No congestion/rhinnorhea. Mouth/Throat: Mucous membranes are moist.  Oropharynx non-erythematous. Neck: No stridor.  No cervical spine tenderness to palpation. Cardiovascular: Normal rate, regular rhythm. Grossly normal heart sounds.  Good peripheral circulation. Respiratory: Normal respiratory effort.  No retractions. Lungs CTAB. Gastrointestinal: Soft and nontender. No distention. No abdominal bruits. No CVA tenderness. Musculoskeletal: No lower extremity  tenderness nor edema.   Neurologic:  Normal speech and language. No gross focal neurologic deficits are appreciated. Skin:  Skin is warm, dry and intact but flushed. No rash noted.   ____________________________________________   LABS (all labs ordered are listed, but only abnormal results are displayed)  Labs Reviewed  URINALYSIS, COMPLETE (UACMP) WITH MICROSCOPIC - Abnormal; Notable for the following components:      Result Value   Color, Urine YELLOW (*)    APPearance CLOUDY (*)    Glucose, UA >=500 (*)    Hgb urine dipstick SMALL (*)    Protein, ur >=300 (*)    Nitrite POSITIVE (*)    Leukocytes,Ua MODERATE (*)    WBC, UA >50 (*)    Bacteria, UA RARE (*)    All other components within normal limits  COMPREHENSIVE METABOLIC PANEL - Abnormal; Notable for the following components:   Sodium 131 (*)    Potassium 3.1 (*)    Chloride 97 (*)    Glucose, Bld 312 (*)    Calcium 8.1 (*)    Albumin 2.9 (*)    All other components within normal limits  CBC WITH DIFFERENTIAL/PLATELET - Abnormal; Notable for the following components:   WBC 10.9 (*)    Hemoglobin 8.7 (*)    HCT 28.9 (*)    MCV 59.1 (*)    MCH 17.8 (*)    RDW 18.0 (*)    Neutro Abs 9.2 (*)    All other components within normal limits  IRON AND TIBC - Abnormal; Notable for the following components:   Iron 7 (*)    Saturation Ratios 2 (*)    All other components within normal limits  RESPIRATORY PANEL BY RT PCR (FLU A&B, COVID)  URINE CULTURE  CULTURE, BLOOD (ROUTINE X 2)  CULTURE, BLOOD (ROUTINE X 2)  LACTIC ACID, PLASMA  LACTIC ACID, PLASMA  FOLATE  FERRITIN  PREGNANCY, URINE  HIV ANTIBODY (ROUTINE TESTING W REFLEX)  PROTIME-INR  CORTISOL-AM, BLOOD  PROCALCITONIN  BASIC METABOLIC PANEL  CBC  HEMOGLOBIN A1C  VITAMIN B12  POCT PREGNANCY, URINE  TROPONIN I (HIGH SENSITIVITY)  TROPONIN I (HIGH SENSITIVITY)   ____________________________________________  EKG  EKG read interpreted by me shows sinus  tachycardia rate of 107 normal axis nonspecific ST-T wave changes ____________________________________________  RADIOLOGY  ED MD interpretation: Chest x-ray read by radiology reviewed by me shows no acute disease CT read by radiology reviewed by me shows bilateral perinephric stranding with slightly edematous kidneys.  Radiologist called me to inform me of this and said he is worried about pyelonephritis.  This would along with the patient's back pain history.  I am also can ask him about the uterus would seem slightly prominent in this nonpregnant patient.  Official radiology report(s): DG Chest Portable 1 View  Result Date: 12/12/2019 CLINICAL DATA:  Cough and fever EXAM: PORTABLE CHEST 1 VIEW COMPARISON:  None. FINDINGS: Diminished lung volumes. No focal consolidation, pleural effusion or pneumothorax. Normal cardiomediastinal silhouette IMPRESSION: Low lung volumes. No focal pulmonary infiltrate. Electronically Signed   By: Jasmine Pang M.D.   On: 12/12/2019 20:53   CT Renal Stone Study  Result Date: 12/12/2019 CLINICAL DATA:  Flank pain, stone disease suspected, pregnant patient EXAM: CT ABDOMEN AND PELVIS WITHOUT CONTRAST TECHNIQUE: Multidetector CT imaging of the abdomen and pelvis was performed following the standard protocol without IV contrast. COMPARISON:  None. FINDINGS: Lower chest: Lung bases are clear. Normal heart size. No pericardial effusion. Hepatobiliary: No visible focal liver lesions on this unenhanced CT. Smooth liver surface contour. Normal hepatic attenuation. Gallbladder and biliary tree are unremarkable. Pancreas: Partial fatty replacement of the pancreas. No pancreatic ductal dilatation or surrounding inflammatory changes. Spleen: Normal in size. No concerning splenic lesions. Adrenals/Urinary Tract: Normal adrenal glands. No visible or contour deforming renal lesions are seen. Bilateral perinephric stranding with a mildly edematous appearance of both kidneys. There is  slight prominence of the collecting systems bilaterally without visible calculus or frank hydronephrosis. Mild circumferential bladder wall thickening and perivesicular hazy stranding is noted as well. No visible bladder calculi or debris. Stomach/Bowel: Distal esophagus, stomach and duodenal sweep are unremarkable. No small bowel wall thickening or dilatation. No evidence of obstruction. A normal appendix is visualized. No colonic dilatation or wall thickening. Vascular/Lymphatic: No significant vascular findings are present. Few slightly prominent clustered though nonenlarged lymph nodes are present in the right lower quadrant. No pathologically enlarged nodes are present in the abdomen or pelvis. Reproductive: Normal appearance of the uterus and adnexal structures. Other: No abdominopelvic free fluid or free gas. No bowel containing hernias. Musculoskeletal: No acute osseous abnormality or suspicious osseous lesion. IMPRESSION: 1. Bilateral perinephric stranding with a mildly edematous appearance of both kidneys. There is slight prominence of the collecting systems bilaterally without visible calculus or frank hydronephrosis. Mild circumferential bladder wall thickening and perivesicular hazy stranding is noted as well. Findings are concerning for ascending urinary tract infection/pyelonephritis. Correlate with urinalysis. 2. Few slightly prominent clustered though nonenlarged lymph nodes in the right lower quadrant, nonspecific, though possibly reactive related to mesenteric adenitis. 3. Normal appendix. Electronically Signed   By: Kreg Shropshire M.D.   On: 12/12/2019 21:42    ____________________________________________   PROCEDURES  Procedure(s) performed (including Critical Care): Critical care time half an hour this includes evaluating the patient and reviewing her old records labs x-ray CT scan EKG and her old records as well.  Additionally I have spoken with the hospitalist and the radiologist about  her.  Procedures   ____________________________________________   INITIAL IMPRESSION / ASSESSMENT AND PLAN / ED COURSE  Patient is septic.  We will get her in the hospital.  CT looks like pyelonephritis so we are treating her with IV Rocephin which should be good.  The hospitalist will continue to follow her.   ----------------------------------------- 11:38 PM on 12/12/2019 -----------------------------------------  Patient CT scan is consistent with pyelonephritis .  Urinalysis shows a lot of white blood cells with white blood cell clumps as well.  This is all consistent with her diagnosis.  There is no sign of any obstruction that would be an emergency.  No sign of any kidney stones either at this point.  We will get her fluids and antibiotics and get her in the hospital.        ____________________________________________  FINAL CLINICAL IMPRESSION(S) / ED DIAGNOSES  Final diagnoses:  Sepsis, due to unspecified organism, unspecified whether acute organ dysfunction present [facility])  Pyelonephritis     ED Discharge Orders    None      *Please note:  Gabryelle Whitmoyer was evaluated in Emergency Department on 12/12/2019 for the symptoms described in the history of present illness. She was evaluated in the context of the global COVID-19 pandemic, which necessitated consideration that the patient might be at risk for infection with the SARS-CoV-2 virus that causes COVID-19. Institutional protocols and algorithms that pertain to the evaluation of patients at risk for COVID-19 are in a state of rapid change based on information released by regulatory bodies including the CDC and federal and state organizations. These policies and algorithms were followed during the patient's care in the ED.  Some ED evaluations and interventions may be delayed as a result of limited staffing during and the pandemic.*   Note:  This document was prepared using Dragon voice recognition software and  may include unintentional dictation errors.    Arnaldo Natal, MD 12/12/19 0932    Arnaldo Natal, MD 12/12/19 [phone]

## 2019-12-13 ENCOUNTER — Encounter: Payer: Self-pay | Admitting: Internal Medicine

## 2019-12-13 LAB — CBC
HCT: 28.5 % — ABNORMAL LOW (ref 36.0–46.0)
Hemoglobin: 8.1 g/dL — ABNORMAL LOW (ref 12.0–15.0)
MCH: 17.2 pg — ABNORMAL LOW (ref 26.0–34.0)
MCHC: 28.4 g/dL — ABNORMAL LOW (ref 30.0–36.0)
MCV: 60.6 fL — ABNORMAL LOW (ref 80.0–100.0)
Platelets: 358 10*3/uL (ref 150–400)
RBC: 4.7 MIL/uL (ref 3.87–5.11)
RDW: 18 % — ABNORMAL HIGH (ref 11.5–15.5)
WBC: 11.7 10*3/uL — ABNORMAL HIGH (ref 4.0–10.5)
nRBC: 0 % (ref 0.0–0.2)

## 2019-12-13 LAB — PROTIME-INR
INR: 1.2 (ref 0.8–1.2)
Prothrombin Time: 14.4 seconds (ref 11.4–15.2)

## 2019-12-13 LAB — GLUCOSE, CAPILLARY
Glucose-Capillary: 261 mg/dL — ABNORMAL HIGH (ref 70–99)
Glucose-Capillary: 198 mg/dL — ABNORMAL HIGH (ref 70–99)
Glucose-Capillary: 218 mg/dL — ABNORMAL HIGH (ref 70–99)
Glucose-Capillary: 304 mg/dL — ABNORMAL HIGH (ref 70–99)

## 2019-12-13 LAB — BASIC METABOLIC PANEL
Anion gap: 11 (ref 5–15)
BUN: 5 mg/dL — ABNORMAL LOW (ref 6–20)
CO2: 25 mmol/L (ref 22–32)
Calcium: 7.9 mg/dL — ABNORMAL LOW (ref 8.9–10.3)
Chloride: 101 mmol/L (ref 98–111)
Creatinine, Ser: 0.54 mg/dL (ref 0.44–1.00)
GFR calc Af Amer: >60 mL/min (ref >60)
GFR calc non Af Amer: >60 mL/min (ref >60)
Glucose, Bld: 201 mg/dL — ABNORMAL HIGH (ref 70–99)
Potassium: 3.5 mmol/L (ref 3.5–5.1)
Sodium: 137 mmol/L (ref 135–145)

## 2019-12-13 LAB — PROCALCITONIN: Procalcitonin: 2.06 ng/mL

## 2019-12-13 LAB — HIV ANTIBODY (ROUTINE TESTING W REFLEX): HIV Screen 4th Generation wRfx: NONREACTIVE

## 2019-12-13 LAB — HEMOGLOBIN A1C
Hgb A1c MFr Bld: 11.7 % — ABNORMAL HIGH (ref 4.8–5.6)
Mean Plasma Glucose: 289.09 mg/dL

## 2019-12-13 LAB — VITAMIN B12: Vitamin B-12: 578 pg/mL (ref 180–914)

## 2019-12-13 LAB — CORTISOL-AM, BLOOD: Cortisol - AM: 17.8 ug/dL (ref 6.7–22.6)

## 2019-12-13 NOTE — Progress Notes (Signed)
PROGRESS NOTE    Regina Morris Texta  VOH:[id number] DOB: [birth year] DOA: 12/12/2019 PCP: Patient, No Pcp Per    Brief Narrative:  Regina Morris is a 39 y.o. female with medical history significant for gestational diabetes and hypertension and nephrolithiasis, who presents to the emergency room with a 5-day history of fever low back pain radiating to the lower abdomen, nausea and vomiting as well as urinary frequency.  She denied cough or shortness of breath.  Denies change in bowel habits.  Denies chest pain.  Pain is described as crampy of severe intensity radiating from low back bilaterally to suprapubic area.  Has been using Motrin without improvement.Found with acute pyelonephritis.    Consultants:     Procedures:   Antimicrobials:   ceftriaxone   Subjective: Less rt sided abd pain. Still with nausea, no vomiting. Reports not very hungry  Objective: Vitals:   12/13/19 0500 12/13/19 0600 12/13/19 0630 12/13/19 0700  BP: (!) 119/52 (!) 102/55 108/65 108/86  Pulse: 97 (!) 103 100 (!) 101  Resp: (!) 23     Temp:      TempSrc:      SpO2: 100% 99% 99% 100%    Intake/Output Summary (Last 24 hours) at 12/13/2019 0900 Last data filed at 12/13/2019 0700 Gross per 24 hour  Intake 2860 ml  Output --  Net 2860 ml   There were no vitals filed for this visit.  Examination:  General exam: Appears calm and comfortable  Respiratory system: Clear to auscultation. Respiratory effort normal. Cardiovascular system: S1 & S2 heard, RRR. No JVD, murmurs, rubs, gallops or clicks. No pedal edema. Gastrointestinal system: Abdomen is nondistended, soft and Rt cva tenderness. Normal bowel sounds heard. Central nervous system: Alert and oriented. No focal neurological deficits. Extremities: Symmetric 5 x 5 power. Skin: No rashes, lesions or ulcers Psychiatry: Judgement and insight appear normal. Mood & affect appropriate.     Data Reviewed: I have personally reviewed following  labs and imaging studies  CBC: Recent Labs  Lab 12/12/19 2031 12/13/19 0601  WBC 10.9* 11.7*  NEUTROABS 9.2*  --   HGB 8.7* 8.1*  HCT 28.9* 28.5*  MCV 59.1* 60.6*  PLT 328 358   Basic Metabolic Panel: Recent Labs  Lab 12/12/19 2031 12/13/19 0601  NA 131* 137  K 3.1* 3.5  CL 97* 101  CO2 23 25  GLUCOSE 312* 201*  BUN 8 5*  CREATININE 0.81 0.54  CALCIUM 8.1* 7.9*   GFR: CrCl cannot be calculated (Unknown ideal weight.). Liver Function Tests: Recent Labs  Lab 12/12/19 2031  AST 24  ALT 18  ALKPHOS 73  BILITOT 0.5  PROT 6.7  ALBUMIN 2.9*   No results for input(s): LIPASE, AMYLASE in the last 168 hours. No results for input(s): AMMONIA in the last 168 hours. Coagulation Profile: Recent Labs  Lab 12/13/19 0601  INR 1.2   Cardiac Enzymes: No results for input(s): CKTOTAL, CKMB, CKMBINDEX, TROPONINI in the last 168 hours. BNP (last 3 results) No results for input(s): PROBNP in the last 8760 hours. HbA1C: Recent Labs    12/12/19 2157  HGBA1C 11.7*   CBG: Recent Labs  Lab 12/12/19 2344  GLUCAP 274*   Lipid Profile: No results for input(s): CHOL, HDL, LDLCALC, TRIG, CHOLHDL, LDLDIRECT in the last 72 hours. Thyroid Function Tests: No results for input(s): TSH, T4TOTAL, FREET4, T3FREE, THYROIDAB in the last 72 hours. Anemia Panel: Recent Labs    12/12/19 2031 12/12/19 2157  VITAMINB12  --  578  FOLATE 15.4  --   FERRITIN 13  --   TIBC 343  --   IRON 7*  --    Sepsis Labs: Recent Labs  Lab 12/12/19 2024 12/12/19 2157 12/13/19 0601  PROCALCITON  --   --  2.06  LATICACIDVEN 1.7 1.1  --     Recent Results (from the past 240 hour(s))  Culture, blood (routine x 2)     Status: None (Preliminary result)   Collection Time: 12/12/19  8:24 PM   Specimen: BLOOD  Result Value Ref Range Status   Specimen Description BLOOD RIGHT HAND  Final   Special Requests   Final    BOTTLES DRAWN AEROBIC AND ANAEROBIC Blood Culture adequate volume   Culture    Final    NO GROWTH < 12 HOURS Performed at [facility], [address], [place], [place] [address]    Report Status PENDING  Incomplete  Culture, blood (routine x 2)     Status: None (Preliminary result)   Collection Time: 12/12/19  8:25 PM   Specimen: BLOOD  Result Value Ref Range Status   Specimen Description BLOOD LEFT AC  Final   Special Requests   Final    BOTTLES DRAWN AEROBIC AND ANAEROBIC Blood Culture adequate volume   Culture   Final    NO GROWTH < 12 HOURS Performed at [facility], [address], [place], [place] [address]    Report Status PENDING  Incomplete  Respiratory Panel by RT PCR (Flu A&B, Covid) - Nasopharyngeal Swab     Status: None   Collection Time: 12/12/19  9:57 PM   Specimen: Nasopharyngeal Swab  Result Value Ref Range Status   SARS Coronavirus 2 by RT PCR NEGATIVE NEGATIVE Final    Comment: (NOTE) SARS-CoV-2 target nucleic acids are NOT DETECTED.  The SARS-CoV-2 RNA is generally detectable in upper respiratoy specimens during the acute phase of infection. The lowest concentration of SARS-CoV-2 viral copies this assay can detect is 131 copies/mL. A negative result does not preclude SARS-Cov-2 infection and should not be used as the sole basis for treatment or other patient management decisions. A negative result may occur with  improper specimen collection/handling, submission of specimen other than nasopharyngeal swab, presence of viral mutation(s) within the areas targeted by this assay, and inadequate number of viral copies (<131 copies/mL). A negative result must be combined with clinical observations, patient history, and epidemiological information. The expected result is Negative.  Fact Sheet for Patients:  https://www.moore.com/  Fact Sheet for Healthcare Providers:  https://www.young.biz/  This test is no t yet approved or cleared by the [place] FDA and  has been  authorized for detection and/or diagnosis of SARS-CoV-2 by FDA under an Emergency Use Authorization (EUA). This EUA will remain  in effect (meaning this test can be used) for the duration of the COVID-19 declaration under Section 564(b)(1) of the Act, 21 U.S.C. section 360bbb-3(b)(1), unless the authorization is terminated or revoked sooner.     Influenza A by PCR NEGATIVE NEGATIVE Final   Influenza B by PCR NEGATIVE NEGATIVE Final    Comment: (NOTE) The Xpert Xpress SARS-CoV-2/FLU/RSV assay is intended as an aid in  the diagnosis of influenza from Nasopharyngeal swab specimens and  should not be used as a sole basis for treatment. Nasal washings and  aspirates are unacceptable for Xpert Xpress SARS-CoV-2/FLU/RSV  testing.  Fact Sheet for Patients: https://www.moore.com/  Fact Sheet for Healthcare Providers: https://www.young.biz/  This test is not  yet approved or cleared by the [place] FDA and  has been authorized for detection and/or diagnosis of SARS-CoV-2 by  FDA under an Emergency Use Authorization (EUA). This EUA will remain  in effect (meaning this test can be used) for the duration of the  Covid-19 declaration under Section 564(b)(1) of the Act, 21  U.S.C. section 360bbb-3(b)(1), unless the authorization is  terminated or revoked. Performed at [facility], [address] [place], [place] [address]          Radiology Studies: DG Chest Portable 1 View  Result Date: 12/12/2019 CLINICAL DATA:  Cough and fever EXAM: PORTABLE CHEST 1 VIEW COMPARISON:  None. FINDINGS: Diminished lung volumes. No focal consolidation, pleural effusion or pneumothorax. Normal cardiomediastinal silhouette IMPRESSION: Low lung volumes. No focal pulmonary infiltrate. Electronically Signed   By: Jasmine PangKim  Fujinaga M.D.   On: 12/12/2019 20:53   CT Renal Stone Study  Result Date: 12/12/2019 CLINICAL DATA:  Flank pain, stone disease suspected,  pregnant patient EXAM: CT ABDOMEN AND PELVIS WITHOUT CONTRAST TECHNIQUE: Multidetector CT imaging of the abdomen and pelvis was performed following the standard protocol without IV contrast. COMPARISON:  None. FINDINGS: Lower chest: Lung bases are clear. Normal heart size. No pericardial effusion. Hepatobiliary: No visible focal liver lesions on this unenhanced CT. Smooth liver surface contour. Normal hepatic attenuation. Gallbladder and biliary tree are unremarkable. Pancreas: Partial fatty replacement of the pancreas. No pancreatic ductal dilatation or surrounding inflammatory changes. Spleen: Normal in size. No concerning splenic lesions. Adrenals/Urinary Tract: Normal adrenal glands. No visible or contour deforming renal lesions are seen. Bilateral perinephric stranding with a mildly edematous appearance of both kidneys. There is slight prominence of the collecting systems bilaterally without visible calculus or frank hydronephrosis. Mild circumferential bladder wall thickening and perivesicular hazy stranding is noted as well. No visible bladder calculi or debris. Stomach/Bowel: Distal esophagus, stomach and duodenal sweep are unremarkable. No small bowel wall thickening or dilatation. No evidence of obstruction. A normal appendix is visualized. No colonic dilatation or wall thickening. Vascular/Lymphatic: No significant vascular findings are present. Few slightly prominent clustered though nonenlarged lymph nodes are present in the right lower quadrant. No pathologically enlarged nodes are present in the abdomen or pelvis. Reproductive: Normal appearance of the uterus and adnexal structures. Other: No abdominopelvic free fluid or free gas. No bowel containing hernias. Musculoskeletal: No acute osseous abnormality or suspicious osseous lesion. IMPRESSION: 1. Bilateral perinephric stranding with a mildly edematous appearance of both kidneys. There is slight prominence of the collecting systems bilaterally  without visible calculus or frank hydronephrosis. Mild circumferential bladder wall thickening and perivesicular hazy stranding is noted as well. Findings are concerning for ascending urinary tract infection/pyelonephritis. Correlate with urinalysis. 2. Few slightly prominent clustered though nonenlarged lymph nodes in the right lower quadrant, nonspecific, though possibly reactive related to mesenteric adenitis. 3. Normal appendix. Electronically Signed   By: Kreg ShropshirePrice  DeHay M.D.   On: 12/12/2019 21:42        Scheduled Meds: . acetaminophen  650 mg Oral Once  . enoxaparin (LOVENOX) injection  40 mg Subcutaneous Q24H  . insulin aspart  0-15 Units Subcutaneous TID WC  . insulin aspart  0-5 Units Subcutaneous QHS   Continuous Infusions: . 0.9 % NaCl with KCl 40 mEq / L 100 mL/hr at 12/13/19 0044  . cefTRIAXone (ROCEPHIN)  IV      Assessment & Plan:   Principal Problem:   Acute pyelonephritis Active Problems:   Sepsis (HCC)   History of  gestational diabetes mellitus, not pregnant   Hyperglycemia   Anemia, unspecified   Electrolyte disturbance   39 year old female with history of gestational diabetes and pregnancy-induced hypertension, presenting with a 5-day history of fever low back pain radiating to the lower abdomen, nausea and vomiting as well as urinary frequency.      Acute pyelonephritis   Sepsis (HCC) -Sepsis criteria includes fever of 102.6, tach he had 130, [id number] with CT renal stone protocol showing bilateral perinephric stranding consistent with pyelonephritis.  Lactic acid 1.7 +UA Still with leukocytosis -Code sepsis was initiated on admission Continue ivf for hydration Continue with iv ceftriaxone F/u ucx and bcx pending     Hyperglycemia, suspect new onset diabetes   History of gestational diabetes mellitus, not pregnant Will consult diabetic educator A1c 11.7 Continue with riss May need to get started on metformin with f/u with pcp Consulted  TOC   Electrolyte disturbance: Hyponatremia and hypokalemia -Secondary to nausea and vomiting Supplemented, now stable.   Anemia of uncertain acuity, suspect chronic -Hemoglobin 8.7.  Last hemoglobin 9.4 at [facility] 12/2018 -Suspect iron deficiency, with MCV of 59 Still having menstral cycle, likely the cause. Continue monitoring.     DVT prophylaxis: lovenox Code Status:full Family Communication: none at bedside Status is: Inpatient  Remains inpatient appropriate because:IV treatments appropriate due to intensity of illness or inability to take PO   Dispo: The patient is from: Home              Anticipated d/c is to: Home              Anticipated d/c date is: 2 days              Patient currently is not medically stable to d/c.            LOS: 1 day   Time spent: 35 min with >50% on coc    Lynn Ito, MD [facility] Pager [phone]  If 7PM-7AM, please contact night-coverage [email or web address] Password [email or web address] 12/13/2019, 9:00 AM

## 2019-12-13 NOTE — ED Notes (Signed)
Pt assisted up to commode to void and back into bed. Call bell at right side, monitor in place. Cellphone, mask and tv remote on bedside table. Pt declines offer for po fluids at this time. ivf infusing without difficulty.

## 2019-12-13 NOTE — ED Notes (Signed)
Pt up to restroom, po fluids provided.

## 2019-12-13 NOTE — ED Notes (Signed)
Report to mary, rn.  

## 2019-12-13 NOTE — TOC Initial Note (Addendum)
Transition of Care [facility]) - Initial/Assessment Note    Patient Details  Name: Regina Morris MRN: [id number] Date of Birth: 11/09/1980  [facility] CM/SW Contact:    Marina Goodell Phone Number: [phone] 12/13/2019, 2:49 PM  Clinical Narrative:                  **Spanish speaking patient**  Patient presents to [facility] ED due to 5 days of fever and lower back pain w/ frequent urination.  Patient stats she able to perform all ADLs.  Patient's main contact is spouse Regina Morris [phone].  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  Patient stated she was diagnosed with diabetes but does not have access to medications or PCP.  CSW gave patient [facility] Spanish applications.  Patient also stated she had no idea she was supposed to adapt a diabetes friendly diet, she stated that has never been told what to eat.  CSW stated I would inform the Attending about this.  CSW referred patient to [facility], and asked her to make sure she answers the phone when they call.        Patient Goals and CMS Choice        Expected Discharge Plan and Services                                                Prior Living Arrangements/Services                       Activities of Daily Living      Permission Sought/Granted                  Emotional Assessment              Admission diagnosis:  Acute pyelonephritis [N10] Patient Active Problem List   Diagnosis Date Noted  . Acute pyelonephritis 12/12/2019  . Sepsis (HCC) 12/12/2019  . Hyperglycemia 12/12/2019  . Anemia, unspecified 12/12/2019  . Electrolyte disturbance 12/12/2019  . History of gestational diabetes mellitus, not pregnant 03/27/2012   PCP:  Patient, No Pcp Per Pharmacy:   [facility] [address] [place] - [address] [place] [address] Phone: [phone] Fax:  [phone]     Social Determinants of Health (SDOH) Interventions    Readmission Risk Interventions No flowsheet data found.

## 2019-12-13 NOTE — ED Notes (Addendum)
Caitlyn NT to accompany pt to floor room 215 soon.

## 2019-12-13 NOTE — Progress Notes (Signed)
Admission information obtained via Jacelyn Grip # [id number]. Plan of care explained utilizing interpreter as well.

## 2019-12-13 NOTE — ED Notes (Signed)
ED TO INPATIENT HANDOFF REPORT  ED Nurse Name and Phone #: Corrie Dandy [phone]  S Name/Age/Gender Regina Morris Texta 39 y.o. female Room/Bed: ED37A/ED37A  Code Status   Code Status: Full Code  Home/SNF/Other Home Patient oriented to: self, place, time and situation Is this baseline? Yes   Triage Complete: Triage complete  Chief Complaint Acute pyelonephritis [N10]  Triage Note Patient ambulatory to triage with steady gait, without difficulty, appears uncomfortable; reports x 5 days having fever, lower abd/back pain with N/V and urinary frequency; motrin taken last at 3pm    Allergies Allergies  Allergen Reactions  . Penicillins Rash    Level of Care/Admitting Diagnosis ED Disposition    ED Disposition Condition Comment   Admit  Hospital Area: [facility] [[id number]]  Level of Care: Med-Surg [16]  Covid Evaluation: Confirmed COVID Negative  Date Laboratory Confirmed COVID Negative: 12/12/2019  Diagnosis: Acute pyelonephritis [098119]  Admitting Physician: Andris Baumann [[id number]]  Attending Physician: Andris Baumann [[id number]]  Estimated length of stay: past midnight tomorrow  Certification:: I certify this patient will need inpatient services for at least 2 midnights       B Medical/Surgery History Past Medical History:  Diagnosis Date  . Gestational diabetes   . Kidney stone    History reviewed. No pertinent surgical history.   A IV Location/Drains/Wounds Patient Lines/Drains/Airways Status    Active Line/Drains/Airways    Name Placement date Placement time Site Days   Peripheral IV 12/12/19 Right Hand 12/12/19  2021  Hand  1   Peripheral IV 12/12/19 Left Antecubital 12/12/19  2019  Antecubital  1          Intake/Output Last 24 hours  Intake/Output Summary (Last 24 hours) at 12/13/2019 1322 Last data filed at 12/13/2019 0700 Gross per 24 hour  Intake 2860 ml  Output --  Net 2860 ml    Labs/Imaging Results for orders placed  or performed during the hospital encounter of 12/12/19 (from the past 48 hour(s))  Urinalysis, Complete w Microscopic Urine, Clean Catch     Status: Abnormal   Collection Time: 12/12/19  7:30 PM  Result Value Ref Range   Color, Urine YELLOW (A) YELLOW   APPearance CLOUDY (A) CLEAR   Specific Gravity, Urine 1.021 1.005 - 1.030   pH 7.0 5.0 - 8.0   Glucose, UA >=500 (A) NEGATIVE mg/dL   Hgb urine dipstick SMALL (A) NEGATIVE   Bilirubin Urine NEGATIVE NEGATIVE   Ketones, ur NEGATIVE NEGATIVE mg/dL   Protein, ur >=578 (A) NEGATIVE mg/dL   Nitrite POSITIVE (A) NEGATIVE   Leukocytes,Ua MODERATE (A) NEGATIVE   RBC / HPF 6-10 0 - 5 RBC/hpf   WBC, UA >50 (H) 0 - 5 WBC/hpf   Bacteria, UA RARE (A) NONE SEEN   Squamous Epithelial / LPF 6-10 0 - 5   WBC Clumps PRESENT    Mucus PRESENT    Hyaline Casts, UA PRESENT     Comment: Performed at [facility], [address] [place], [place] [address]  Pregnancy, urine POC     Status: None   Collection Time: 12/12/19  8:04 PM  Result Value Ref Range   Preg Test, Ur NEGATIVE NEGATIVE    Comment:        THE SENSITIVITY OF THIS METHODOLOGY IS >24 mIU/mL   Lactic acid, plasma     Status: None   Collection Time: 12/12/19  8:24 PM  Result Value Ref Range   Lactic Acid, Venous 1.7  0.5 - 1.9 mmol/L    Comment: Performed at [facility], [address] [place], [place] [address]  Culture, blood (routine x 2)     Status: None (Preliminary result)   Collection Time: 12/12/19  8:24 PM   Specimen: BLOOD  Result Value Ref Range   Specimen Description BLOOD RIGHT HAND    Special Requests      BOTTLES DRAWN AEROBIC AND ANAEROBIC Blood Culture adequate volume   Culture      NO GROWTH < 12 HOURS Performed at [facility], [address] [place], [place] [address]    Report Status PENDING   Culture, blood (routine x 2)     Status: None (Preliminary result)   Collection Time: 12/12/19  8:25 PM   Specimen: BLOOD  Result  Value Ref Range   Specimen Description BLOOD LEFT AC    Special Requests      BOTTLES DRAWN AEROBIC AND ANAEROBIC Blood Culture adequate volume   Culture      NO GROWTH < 12 HOURS Performed at [facility], [address], [place], [place] [address]    Report Status PENDING   Comprehensive metabolic panel     Status: Abnormal   Collection Time: 12/12/19  8:31 PM  Result Value Ref Range   Sodium 131 (L) 135 - 145 mmol/L   Potassium 3.1 (L) 3.5 - 5.1 mmol/L   Chloride 97 (L) 98 - 111 mmol/L   CO2 23 22 - 32 mmol/L   Glucose, Bld 312 (H) 70 - 99 mg/dL    Comment: Glucose reference range applies only to samples taken after fasting for at least 8 hours.   BUN 8 6 - 20 mg/dL   Creatinine, Ser 9.14 0.44 - 1.00 mg/dL   Calcium 8.1 (L) 8.9 - 10.3 mg/dL   Total Protein 6.7 6.5 - 8.1 g/dL   Albumin 2.9 (L) 3.5 - 5.0 g/dL   AST 24 15 - 41 U/L   ALT 18 0 - 44 U/L   Alkaline Phosphatase 73 38 - 126 U/L   Total Bilirubin 0.5 0.3 - 1.2 mg/dL   GFR calc non Af Amer >60 >60 mL/min   GFR calc Af Amer >60 >60 mL/min   Anion gap 11 5 - 15    Comment: Performed at [facility], [address] [place], [place] [address]  Troponin I (High Sensitivity)     Status: None   Collection Time: 12/12/19  8:31 PM  Result Value Ref Range   Troponin I (High Sensitivity) 8 <18 ng/L    Comment: (NOTE) Elevated high sensitivity troponin I (hsTnI) values and significant  changes across serial measurements may suggest ACS but many other  chronic and acute conditions are known to elevate hsTnI results.  Refer to the "Links" section for chest pain algorithms and additional  guidance. Performed at [facility], [address] [place], [place] [address]   CBC with Differential     Status: Abnormal   Collection Time: 12/12/19  8:31 PM  Result Value Ref Range   WBC 10.9 (H) 4.0 - 10.5 K/uL   RBC 4.89 3.87 - 5.11 MIL/uL   Hemoglobin 8.7 (L) 12.0 - 15.0 g/dL    Comment: Reticulocyte  Hemoglobin testing may be clinically indicated, consider ordering this additional test [id number]    HCT 28.9 (L) 36 - 46 %   MCV 59.1 (L) 80.0 - 100.0 fL   MCH 17.8 (L) 26.0 - 34.0 pg  MCHC 30.1 30.0 - 36.0 g/dL   RDW 09.8 (H) 11.9 - 14.7 %   Platelets 328 150 - 400 K/uL   nRBC 0.0 0.0 - 0.2 %   Neutrophils Relative % 84 %   Neutro Abs 9.2 (H) 1.7 - 7.7 K/uL   Lymphocytes Relative 9 %   Lymphs Abs 1.0 0.7 - 4.0 K/uL   Monocytes Relative 6 %   Monocytes Absolute 0.7 0 - 1 K/uL   Eosinophils Relative 0 %   Eosinophils Absolute 0.0 0 - 0 K/uL   Basophils Relative 0 %   Basophils Absolute 0.0 0 - 0 K/uL   Immature Granulocytes 1 %   Abs Immature Granulocytes 0.05 0.00 - 0.07 K/uL    Comment: Performed at [facility], [address], [place], [place] [address]  Folate     Status: None   Collection Time: 12/12/19  8:31 PM  Result Value Ref Range   Folate 15.4 >5.9 ng/mL    Comment: Performed at [facility], [address] [place], [place] [address]  Iron and TIBC     Status: Abnormal   Collection Time: 12/12/19  8:31 PM  Result Value Ref Range   Iron 7 (L) 28 - 170 ug/dL   TIBC 657 846 - 962 ug/dL   Saturation Ratios 2 (L) 10.4 - 31.8 %   UIBC 336 ug/dL    Comment: Performed at [facility], [address], [place], [place] [address]  Ferritin     Status: None   Collection Time: 12/12/19  8:31 PM  Result Value Ref Range   Ferritin 13 11 - 307 ng/mL    Comment: Performed at [facility], [address] [place], [place] [address]  Lactic acid, plasma     Status: None   Collection Time: 12/12/19  9:57 PM  Result Value Ref Range   Lactic Acid, Venous 1.1 0.5 - 1.9 mmol/L    Comment: Performed at [facility], [address] [place], [place] [address]  Respiratory Panel by RT PCR (Flu A&B, Covid) - Nasopharyngeal Swab     Status: None   Collection Time: 12/12/19  9:57 PM   Specimen: Nasopharyngeal Swab  Result Value Ref  Range   SARS Coronavirus 2 by RT PCR NEGATIVE NEGATIVE    Comment: (NOTE) SARS-CoV-2 target nucleic acids are NOT DETECTED.  The SARS-CoV-2 RNA is generally detectable in upper respiratoy specimens during the acute phase of infection. The lowest concentration of SARS-CoV-2 viral copies this assay can detect is 131 copies/mL. A negative result does not preclude SARS-Cov-2 infection and should not be used as the sole basis for treatment or other patient management decisions. A negative result may occur with  improper specimen collection/handling, submission of specimen other than nasopharyngeal swab, presence of viral mutation(s) within the areas targeted by this assay, and inadequate number of viral copies (<131 copies/mL). A negative result must be combined with clinical observations, patient history, and epidemiological information. The expected result is Negative.  Fact Sheet for Patients:  https://www.moore.com/  Fact Sheet for Healthcare Providers:  https://www.young.biz/  This test is no t yet approved or cleared by the [place] FDA and  has been authorized for detection and/or diagnosis of SARS-CoV-2 by FDA under an Emergency Use Authorization (EUA). This EUA will remain  in effect (meaning this test can be used) for the duration of the COVID-19 declaration under Section 564(b)(1) of the Act, 21 U.S.C. section 360bbb-3(b)(1), unless the authorization is  terminated or revoked sooner.     Influenza A by PCR NEGATIVE NEGATIVE   Influenza B by PCR NEGATIVE NEGATIVE    Comment: (NOTE) The Xpert Xpress SARS-CoV-2/FLU/RSV assay is intended as an aid in  the diagnosis of influenza from Nasopharyngeal swab specimens and  should not be used as a sole basis for treatment. Nasal washings and  aspirates are unacceptable for Xpert Xpress SARS-CoV-2/FLU/RSV  testing.  Fact Sheet for  Patients: https://www.moore.com/  Fact Sheet for Healthcare Providers: https://www.young.biz/  This test is not yet approved or cleared by the [place] FDA and  has been authorized for detection and/or diagnosis of SARS-CoV-2 by  FDA under an Emergency Use Authorization (EUA). This EUA will remain  in effect (meaning this test can be used) for the duration of the  Covid-19 declaration under Section 564(b)(1) of the Act, 21  U.S.C. section 360bbb-3(b)(1), unless the authorization is  terminated or revoked. Performed at [facility], [address] [place], [place] [address]   Troponin I (High Sensitivity)     Status: None   Collection Time: 12/12/19  9:57 PM  Result Value Ref Range   Troponin I (High Sensitivity) 8 <18 ng/L    Comment: (NOTE) Elevated high sensitivity troponin I (hsTnI) values and significant  changes across serial measurements may suggest ACS but many other  chronic and acute conditions are known to elevate hsTnI results.  Refer to the "Links" section for chest pain algorithms and additional  guidance. Performed at [facility], [address] [place], [place] [address]   HIV Antibody (routine testing w rflx)     Status: None   Collection Time: 12/12/19  9:57 PM  Result Value Ref Range   HIV Screen 4th Generation wRfx Non Reactive Non Reactive    Comment: Performed at [facility], [address] [place], [place] [address]  Hemoglobin A1c     Status: Abnormal   Collection Time: 12/12/19  9:57 PM  Result Value Ref Range   Hgb A1c MFr Bld 11.7 (H) 4.8 - 5.6 %    Comment: (NOTE) Pre diabetes:          5.7%-6.4%  Diabetes:              >6.4%  Glycemic control for   <7.0% adults with diabetes    Mean Plasma Glucose 289.09 mg/dL    Comment: Performed at [facility], [address], [place], [place] [address]  Vitamin B12     Status: None   Collection Time: 12/12/19  9:57 PM   Result Value Ref Range   Vitamin B-12 578 180 - 914 pg/mL    Comment: (NOTE) This assay is not validated for testing neonatal or myeloproliferative syndrome specimens for Vitamin B12 levels. Performed at [facility], [address], [place], [place] [address]   Glucose, capillary     Status: Abnormal   Collection Time: 12/12/19 11:44 PM  Result Value Ref Range   Glucose-Capillary 274 (H) 70 - 99 mg/dL    Comment: Glucose reference range applies only to samples taken after fasting for at least 8 hours.  Protime-INR     Status: None   Collection Time: 12/13/19  6:01 AM  Result Value Ref Range   Prothrombin Time 14.4 11.4 - 15.2 seconds   INR 1.2 0.8 - 1.2    Comment: (NOTE) INR goal varies based on device and disease states. Performed at [facility], [address] [place], [place] [address]   Cortisol-am, blood     Status: None   Collection Time: 12/13/19  6:01 AM  Result Value Ref Range   Cortisol - AM 17.8 6.7 - 22.6 ug/dL    Comment: Performed at [facility], [address] [place], [place] [address]  Procalcitonin     Status: None   Collection Time: 12/13/19  6:01 AM  Result Value Ref Range   Procalcitonin 2.06 ng/mL    Comment:        Interpretation: PCT > 2 ng/mL: Systemic infection (sepsis) is likely, unless other causes are known. (NOTE)       Sepsis PCT Algorithm           Lower Respiratory Tract                                      Infection PCT Algorithm    ----------------------------     ----------------------------         PCT < 0.25 ng/mL                PCT < 0.10 ng/mL          Strongly encourage             Strongly discourage   discontinuation of antibiotics    initiation of antibiotics    ----------------------------     -----------------------------       PCT 0.25 - 0.50 ng/mL            PCT 0.10 - 0.25 ng/mL               OR       >80% decrease in PCT            Discourage initiation of                                             antibiotics      Encourage discontinuation           of antibiotics    ----------------------------     -----------------------------         PCT >= 0.50 ng/mL              PCT 0.26 - 0.50 ng/mL               AND       <80% decrease in PCT              Encourage initiation of                                             antibiotics       Encourage continuation           of antibiotics    ----------------------------     -----------------------------        PCT >= 0.50 ng/mL                  PCT > 0.50 ng/mL               AND         increase in PCT  Strongly encourage                                      initiation of antibiotics    Strongly encourage escalation           of antibiotics                                     -----------------------------                                           PCT <= 0.25 ng/mL                                                 OR                                        > 80% decrease in PCT                                      Discontinue / Do not initiate                                             antibiotics  Performed at [facility], [address] [place], [place] [address]   Basic metabolic panel     Status: Abnormal   Collection Time: 12/13/19  6:01 AM  Result Value Ref Range   Sodium 137 135 - 145 mmol/L   Potassium 3.5 3.5 - 5.1 mmol/L   Chloride 101 98 - 111 mmol/L   CO2 25 22 - 32 mmol/L   Glucose, Bld 201 (H) 70 - 99 mg/dL    Comment: Glucose reference range applies only to samples taken after fasting for at least 8 hours.   BUN 5 (L) 6 - 20 mg/dL   Creatinine, Ser 6.040.54 0.44 - 1.00 mg/dL   Calcium 7.9 (L) 8.9 - 10.3 mg/dL   GFR calc non Af Amer >60 >60 mL/min   GFR calc Af Amer >60 >60 mL/min   Anion gap 11 5 - 15    Comment: Performed at [facility], [address] [place], [place] [address]  CBC     Status: Abnormal   Collection Time: 12/13/19  6:01 AM  Result Value Ref Range    WBC 11.7 (H) 4.0 - 10.5 K/uL   RBC 4.70 3.87 - 5.11 MIL/uL   Hemoglobin 8.1 (L) 12.0 - 15.0 g/dL    Comment: Reticulocyte Hemoglobin testing may be clinically indicated, consider ordering this additional test [id number]    HCT 28.5 (L) 36 - 46 %   MCV 60.6 (L) 80.0 - 100.0 fL   MCH 17.2 (L) 26.0 - 34.0 pg   MCHC 28.4 (L) 30.0 - 36.0 g/dL   RDW 95.618.0 (H) 21.311.5 -  15.5 %   Platelets 358 150 - 400 K/uL   nRBC 0.0 0.0 - 0.2 %    Comment: Performed at [facility], [address] [place], [place] [address]  Glucose, capillary     Status: Abnormal   Collection Time: 12/13/19  9:44 AM  Result Value Ref Range   Glucose-Capillary 261 (H) 70 - 99 mg/dL    Comment: Glucose reference range applies only to samples taken after fasting for at least 8 hours.  Glucose, capillary     Status: Abnormal   Collection Time: 12/13/19 12:40 PM  Result Value Ref Range   Glucose-Capillary 198 (H) 70 - 99 mg/dL    Comment: Glucose reference range applies only to samples taken after fasting for at least 8 hours.   DG Chest Portable 1 View  Result Date: 12/12/2019 CLINICAL DATA:  Cough and fever EXAM: PORTABLE CHEST 1 VIEW COMPARISON:  None. FINDINGS: Diminished lung volumes. No focal consolidation, pleural effusion or pneumothorax. Normal cardiomediastinal silhouette IMPRESSION: Low lung volumes. No focal pulmonary infiltrate. Electronically Signed   By: Jasmine Pang M.D.   On: 12/12/2019 20:53   CT Renal Stone Study  Result Date: 12/12/2019 CLINICAL DATA:  Flank pain, stone disease suspected, pregnant patient EXAM: CT ABDOMEN AND PELVIS WITHOUT CONTRAST TECHNIQUE: Multidetector CT imaging of the abdomen and pelvis was performed following the standard protocol without IV contrast. COMPARISON:  None. FINDINGS: Lower chest: Lung bases are clear. Normal heart size. No pericardial effusion. Hepatobiliary: No visible focal liver lesions on this unenhanced CT. Smooth liver surface contour. Normal hepatic  attenuation. Gallbladder and biliary tree are unremarkable. Pancreas: Partial fatty replacement of the pancreas. No pancreatic ductal dilatation or surrounding inflammatory changes. Spleen: Normal in size. No concerning splenic lesions. Adrenals/Urinary Tract: Normal adrenal glands. No visible or contour deforming renal lesions are seen. Bilateral perinephric stranding with a mildly edematous appearance of both kidneys. There is slight prominence of the collecting systems bilaterally without visible calculus or frank hydronephrosis. Mild circumferential bladder wall thickening and perivesicular hazy stranding is noted as well. No visible bladder calculi or debris. Stomach/Bowel: Distal esophagus, stomach and duodenal sweep are unremarkable. No small bowel wall thickening or dilatation. No evidence of obstruction. A normal appendix is visualized. No colonic dilatation or wall thickening. Vascular/Lymphatic: No significant vascular findings are present. Few slightly prominent clustered though nonenlarged lymph nodes are present in the right lower quadrant. No pathologically enlarged nodes are present in the abdomen or pelvis. Reproductive: Normal appearance of the uterus and adnexal structures. Other: No abdominopelvic free fluid or free gas. No bowel containing hernias. Musculoskeletal: No acute osseous abnormality or suspicious osseous lesion. IMPRESSION: 1. Bilateral perinephric stranding with a mildly edematous appearance of both kidneys. There is slight prominence of the collecting systems bilaterally without visible calculus or frank hydronephrosis. Mild circumferential bladder wall thickening and perivesicular hazy stranding is noted as well. Findings are concerning for ascending urinary tract infection/pyelonephritis. Correlate with urinalysis. 2. Few slightly prominent clustered though nonenlarged lymph nodes in the right lower quadrant, nonspecific, though possibly reactive related to mesenteric adenitis. 3.  Normal appendix. Electronically Signed   By: Kreg Shropshire M.D.   On: 12/12/2019 21:42    Pending Labs Unresulted Labs (From admission, onward)          Start     Ordered   12/19/19 0500  Creatinine, serum  (enoxaparin (LOVENOX)    CrCl >/= 30 ml/min)  Weekly,   STAT     Comments: while  on enoxaparin therapy    12/12/19 2215   12/12/19 2015  Pregnancy, urine  Once,   STAT        12/12/19 2014   12/12/19 1956  Urine culture  Once,   STAT        12/12/19 1955   Unscheduled  Occult blood card to lab, stool  As needed,   STAT      12/12/19 2238          Vitals/Pain Today's Vitals   12/13/19 1115 12/13/19 1130 12/13/19 1200 12/13/19 1230  BP:  (!) 108/53 (!) 101/56 108/66  Pulse:  97 91 89  Resp: 16  20 18   Temp:      TempSrc:      SpO2:  100% 97% 99%  PainSc:        Isolation Precautions No active isolations  Medications Medications  acetaminophen (TYLENOL) tablet 650 mg (650 mg Oral Not Given 12/12/19 2021)  enoxaparin (LOVENOX) injection 40 mg ( Subcutaneous Canceled Entry 12/12/19 2243)  cefTRIAXone (ROCEPHIN) 1 g in sodium chloride 0.9 % 100 mL IVPB (has no administration in time range)  acetaminophen (TYLENOL) tablet 650 mg (has no administration in time range)    Or  acetaminophen (TYLENOL) suppository 650 mg (has no administration in time range)  HYDROcodone-acetaminophen (NORCO/VICODIN) 5-325 MG per tablet 1-2 tablet (has no administration in time range)  ketorolac (TORADOL) 30 MG/ML injection 30 mg (has no administration in time range)  ondansetron (ZOFRAN) tablet 4 mg (has no administration in time range)    Or  ondansetron (ZOFRAN) injection 4 mg (has no administration in time range)  senna-docusate (Senokot-S) tablet 1 tablet (has no administration in time range)  insulin aspart (novoLOG) injection 0-15 Units (8 Units Subcutaneous Given 12/13/19 0947)  insulin aspart (novoLOG) injection 0-5 Units (3 Units Subcutaneous Given 12/13/19 0002)  morphine 2 MG/ML  injection 2 mg (has no administration in time range)  0.9 % NaCl with KCl 40 mEq / L  infusion ( Intravenous New Bag/Given 12/13/19 0044)  acetaminophen (TYLENOL) tablet 1,000 mg (1,000 mg Oral Given 12/12/19 1944)  cefTRIAXone (ROCEPHIN) 1 g in sodium chloride 0.9 % 100 mL IVPB (0 g Intravenous Stopped 12/12/19 2148)  lactated ringers bolus 2,000 mL (0 mLs Intravenous Stopped 12/13/19 0135)  lactated ringers bolus 500 mL (0 mLs Intravenous Stopped 12/13/19 0208)    Mobility walks Low fall risk   Focused Assessments Pulmonary Assessment Handoff:  Lung sounds:   O2 Device: Room Air     ,    R Recommendations: See Admitting Provider Note  Report given to:   Additional Notes:

## 2019-12-13 NOTE — ED Notes (Signed)
Pt finished lunch. Delay explained; pt understands Caitlyn NT will be by room soon to take her to room 215.

## 2019-12-13 NOTE — ED Notes (Signed)
yeni in lab notified of need for venipuncture assist for am labs. This rn unable to secure blood from venipuncture or iv sites. Head of bed adjusted for pt comfort, call bell at right side.

## 2019-12-14 ENCOUNTER — Other Ambulatory Visit: Payer: Self-pay | Admitting: Internal Medicine

## 2019-12-14 LAB — CBC
HCT: 27.1 % — ABNORMAL LOW (ref 36.0–46.0)
Hemoglobin: 7.7 g/dL — ABNORMAL LOW (ref 12.0–15.0)
MCH: 17.7 pg — ABNORMAL LOW (ref 26.0–34.0)
MCHC: 28.4 g/dL — ABNORMAL LOW (ref 30.0–36.0)
MCV: 62.2 fL — ABNORMAL LOW (ref 80.0–100.0)
Platelets: 345 10*3/uL (ref 150–400)
RBC: 4.36 MIL/uL (ref 3.87–5.11)
RDW: 18.6 % — ABNORMAL HIGH (ref 11.5–15.5)
WBC: 9.9 10*3/uL (ref 4.0–10.5)
nRBC: 0 % (ref 0.0–0.2)

## 2019-12-14 LAB — BASIC METABOLIC PANEL
Anion gap: 8 (ref 5–15)
BUN: 6 mg/dL (ref 6–20)
CO2: 25 mmol/L (ref 22–32)
Calcium: 7.9 mg/dL — ABNORMAL LOW (ref 8.9–10.3)
Chloride: 104 mmol/L (ref 98–111)
Creatinine, Ser: 0.63 mg/dL (ref 0.44–1.00)
GFR calc Af Amer: >60 mL/min (ref >60)
GFR calc non Af Amer: >60 mL/min (ref >60)
Glucose, Bld: 194 mg/dL — ABNORMAL HIGH (ref 70–99)
Potassium: 3.7 mmol/L (ref 3.5–5.1)
Sodium: 137 mmol/L (ref 135–145)

## 2019-12-14 LAB — GLUCOSE, CAPILLARY
Glucose-Capillary: 206 mg/dL — ABNORMAL HIGH (ref 70–99)
Glucose-Capillary: 183 mg/dL — ABNORMAL HIGH (ref 70–99)
Glucose-Capillary: 172 mg/dL — ABNORMAL HIGH (ref 70–99)
Glucose-Capillary: 311 mg/dL — ABNORMAL HIGH (ref 70–99)

## 2019-12-14 LAB — URINE CULTURE: Culture: >=100000 — AB

## 2019-12-14 MED ORDER — FERROUS SULFATE 325 (65 FE) MG PO TABS
325.0000 mg | ORAL_TABLET | Freq: Every day | ORAL | Status: DC
  Administered 2019-12-14 – 2019-12-15 (×2): 325 mg via ORAL
  Filled 2019-12-14 (×2): qty 1

## 2019-12-14 MED ORDER — SODIUM CHLORIDE 0.9 % IV SOLN: INTRAVENOUS | Status: DC

## 2019-12-14 MED ORDER — CIPROFLOXACIN HCL 500 MG PO TABS: 500.0000 mg | ORAL_TABLET | Freq: Two times a day (BID) | ORAL | 0 refills | Status: DC

## 2019-12-14 MED ORDER — METFORMIN HCL 500 MG PO TABS: 500.0000 mg | ORAL_TABLET | Freq: Every day | ORAL | 0 refills | Status: DC

## 2019-12-14 MED ORDER — METFORMIN HCL 500 MG PO TABS
500.0000 mg | ORAL_TABLET | Freq: Every day | ORAL | Status: DC
  Administered 2019-12-14 – 2019-12-15 (×2): 500 mg via ORAL
  Filled 2019-12-14 (×2): qty 1

## 2019-12-14 MED ORDER — LIVING WELL WITH DIABETES BOOK - IN SPANISH
Freq: Once | Status: AC
  Filled 2019-12-14 (×2): qty 1

## 2019-12-14 NOTE — Progress Notes (Signed)
PROGRESS NOTE    Regina Morris Texta  GYF:[id number] DOB: October 08, 1980 DOA: 12/12/2019 PCP: Patient, No Pcp Per    Brief Narrative:  Regina Morris is a 39 y.o. female with medical history significant for gestational diabetes and hypertension and nephrolithiasis, who presents to the emergency room with a 5-day history of fever low back pain radiating to the lower abdomen, nausea and vomiting as well as urinary frequency.  She denied cough or shortness of breath.  Denies change in bowel habits.  Denies chest pain.  Pain is described as crampy of severe intensity radiating from low back bilaterally to suprapubic area.  Has been using Motrin without improvement.Found with acute pyelonephritis.    Consultants:     Procedures:   Antimicrobials:   ceftriaxone   Subjective: Spiked fever yesterday, tmax 101. Pt reports abd pain improved. Little nasuea. No vomiting  Objective: Vitals:   12/14/19 0755 12/14/19 1139 12/14/19 1609 12/14/19 2028  BP: 115/77 105/69 108/71 114/74  Pulse:  77 83 85  Resp: 18 20 20 17   Temp: 98 F (36.7 C) 98.3 F (36.8 C) 98.2 F (36.8 C) 98.1 F (36.7 C)  TempSrc: Oral Oral Oral Oral  SpO2:  99% 100% 99%    Intake/Output Summary (Last 24 hours) at 12/14/2019 2136 Last data filed at 12/14/2019 02/13/2020 Gross per 24 hour  Intake 2499.75 ml  Output --  Net 2499.75 ml   There were no vitals filed for this visit.  Examination  Comfortable, nad cta no r/r/w Regular s1/s2 n omurmurs Benign, soft nt/nd +bs No edema  aaxox3 , mood and affect appropriate  Data Reviewed: I have personally reviewed following labs and imaging studies  CBC: Recent Labs  Lab 12/12/19 2031 12/13/19 0601 12/14/19 0455  WBC 10.9* 11.7* 9.9  NEUTROABS 9.2*  --   --   HGB 8.7* 8.1* 7.7*  HCT 28.9* 28.5* 27.1*  MCV 59.1* 60.6* 62.2*  PLT 328 358 345   Basic Metabolic Panel: Recent Labs  Lab 12/12/19 2031 12/13/19 0601 12/14/19 0455  NA 131* 137 137  K 3.1*  3.5 3.7  CL 97* 101 104  CO2 23 25 25   GLUCOSE 312* 201* 194*  BUN 8 5* 6  CREATININE 0.81 0.54 0.63  CALCIUM 8.1* 7.9* 7.9*   GFR: CrCl cannot be calculated (Unknown ideal weight.). Liver Function Tests: Recent Labs  Lab 12/12/19 2031  AST 24  ALT 18  ALKPHOS 73  BILITOT 0.5  PROT 6.7  ALBUMIN 2.9*   No results for input(s): LIPASE, AMYLASE in the last 168 hours. No results for input(s): AMMONIA in the last 168 hours. Coagulation Profile: Recent Labs  Lab 12/13/19 0601  INR 1.2   Cardiac Enzymes: No results for input(s): CKTOTAL, CKMB, CKMBINDEX, TROPONINI in the last 168 hours. BNP (last 3 results) No results for input(s): PROBNP in the last 8760 hours. HbA1C: Recent Labs    12/12/19 2157  HGBA1C 11.7*   CBG: Recent Labs  Lab 12/13/19 2003 12/14/19 0735 12/14/19 1137 12/14/19 1633 12/14/19 2115  GLUCAP 304* 206* 183* 172* 311*   Lipid Profile: No results for input(s): CHOL, HDL, LDLCALC, TRIG, CHOLHDL, LDLDIRECT in the last 72 hours. Thyroid Function Tests: No results for input(s): TSH, T4TOTAL, FREET4, T3FREE, THYROIDAB in the last 72 hours. Anemia Panel: Recent Labs    12/12/19 2031 12/12/19 2157  VITAMINB12  --  578  FOLATE 15.4  --   FERRITIN 13  --   TIBC 343  --  IRON 7*  --    Sepsis Labs: Recent Labs  Lab 12/12/19 2024 12/12/19 2157 12/13/19 0601  PROCALCITON  --   --  2.06  LATICACIDVEN 1.7 1.1  --     Recent Results (from the past 240 hour(s))  Urine culture     Status: Abnormal   Collection Time: 12/12/19  7:30 PM   Specimen: Urine, Random  Result Value Ref Range Status   Specimen Description   Final    URINE, RANDOM Performed at [facility], [address] [place], [place] [address]    Special Requests   Final    NONE Performed at [facility], [address] [place], [place] [address]    Culture >=100,000 COLONIES/mL ESCHERICHIA COLI (A)  Final   Report Status 12/14/2019 FINAL  Final    Organism ID, Bacteria ESCHERICHIA COLI (A)  Final      Susceptibility   Escherichia coli - MIC*    AMPICILLIN <=2 SENSITIVE Sensitive     CEFAZOLIN <=4 SENSITIVE Sensitive     CEFTRIAXONE <=0.25 SENSITIVE Sensitive     CIPROFLOXACIN <=0.25 SENSITIVE Sensitive     GENTAMICIN <=1 SENSITIVE Sensitive     IMIPENEM <=0.25 SENSITIVE Sensitive     NITROFURANTOIN <=16 SENSITIVE Sensitive     TRIMETH/SULFA <=20 SENSITIVE Sensitive     AMPICILLIN/SULBACTAM <=2 SENSITIVE Sensitive     PIP/TAZO <=4 SENSITIVE Sensitive     * >=100,000 COLONIES/mL ESCHERICHIA COLI  Culture, blood (routine x 2)     Status: None (Preliminary result)   Collection Time: 12/12/19  8:24 PM   Specimen: BLOOD  Result Value Ref Range Status   Specimen Description BLOOD RIGHT HAND  Final   Special Requests   Final    BOTTLES DRAWN AEROBIC AND ANAEROBIC Blood Culture adequate volume   Culture   Final    NO GROWTH 2 DAYS Performed at [facility], [address], [place], [place] [address]    Report Status PENDING  Incomplete  Culture, blood (routine x 2)     Status: None (Preliminary result)   Collection Time: 12/12/19  8:25 PM   Specimen: BLOOD  Result Value Ref Range Status   Specimen Description BLOOD LEFT AC  Final   Special Requests   Final    BOTTLES DRAWN AEROBIC AND ANAEROBIC Blood Culture adequate volume   Culture   Final    NO GROWTH 2 DAYS Performed at [facility], [address], [place], [place] [address]    Report Status PENDING  Incomplete  Respiratory Panel by RT PCR (Flu A&B, Covid) - Nasopharyngeal Swab     Status: None   Collection Time: 12/12/19  9:57 PM   Specimen: Nasopharyngeal Swab  Result Value Ref Range Status   SARS Coronavirus 2 by RT PCR NEGATIVE NEGATIVE Final    Comment: (NOTE) SARS-CoV-2 target nucleic acids are NOT DETECTED.  The SARS-CoV-2 RNA is generally detectable in upper respiratoy specimens during the acute phase of infection. The  lowest concentration of SARS-CoV-2 viral copies this assay can detect is 131 copies/mL. A negative result does not preclude SARS-Cov-2 infection and should not be used as the sole basis for treatment or other patient management decisions. A negative result may occur with  improper specimen collection/handling, submission of specimen other than nasopharyngeal swab, presence of viral mutation(s) within the areas targeted by this assay, and inadequate number of viral copies (<131 copies/mL). A negative result must be combined with clinical observations, patient history,  and epidemiological information. The expected result is Negative.  Fact Sheet for Patients:  https://www.moore.com/https://www.fda.gov/media/142436/download  Fact Sheet for Healthcare Providers:  https://www.young.biz/https://www.fda.gov/media/142435/download  This test is no t yet approved or cleared by the [place] FDA and  has been authorized for detection and/or diagnosis of SARS-CoV-2 by FDA under an Emergency Use Authorization (EUA). This EUA will remain  in effect (meaning this test can be used) for the duration of the COVID-19 declaration under Section 564(b)(1) of the Act, 21 U.S.C. section 360bbb-3(b)(1), unless the authorization is terminated or revoked sooner.     Influenza A by PCR NEGATIVE NEGATIVE Final   Influenza B by PCR NEGATIVE NEGATIVE Final    Comment: (NOTE) The Xpert Xpress SARS-CoV-2/FLU/RSV assay is intended as an aid in  the diagnosis of influenza from Nasopharyngeal swab specimens and  should not be used as a sole basis for treatment. Nasal washings and  aspirates are unacceptable for Xpert Xpress SARS-CoV-2/FLU/RSV  testing.  Fact Sheet for Patients: https://www.moore.com/https://www.fda.gov/media/142436/download  Fact Sheet for Healthcare Providers: https://www.young.biz/https://www.fda.gov/media/142435/download  This test is not yet approved or cleared by the [place] FDA and  has been authorized for detection and/or diagnosis of SARS-CoV-2 by  FDA under  an Emergency Use Authorization (EUA). This EUA will remain  in effect (meaning this test can be used) for the duration of the  Covid-19 declaration under Section 564(b)(1) of the Act, 21  U.S.C. section 360bbb-3(b)(1), unless the authorization is  terminated or revoked. Performed at [facility], [address] [place], [place] [address]          Radiology Studies: No results found.      Scheduled Meds: . acetaminophen  650 mg Oral Once  . enoxaparin (LOVENOX) injection  40 mg Subcutaneous Q24H  . ferrous sulfate  325 mg Oral Q breakfast  . insulin aspart  0-15 Units Subcutaneous TID WC  . insulin aspart  0-5 Units Subcutaneous QHS  . metFORMIN  500 mg Oral Q breakfast   Continuous Infusions: . cefTRIAXone (ROCEPHIN)  IV 1 g (12/14/19 1731)    Assessment & Plan:   Principal Problem:   Acute pyelonephritis Active Problems:   Sepsis (HCC)   History of gestational diabetes mellitus, not pregnant   Hyperglycemia   Anemia, unspecified   Electrolyte disturbance   39 year old female with history of gestational diabetes and pregnancy-induced hypertension, presenting with a 5-day history of fever low back pain radiating to the lower abdomen, nausea and vomiting as well as urinary frequency.      Acute pyelonephritis   Sepsis (HCC) -Sepsis criteria includes fever of 102.6, tach he had 130, [id number] with CT renal stone protocol showing bilateral perinephric stranding consistent with pyelonephritis.  Lactic acid 1.7 10/1- ucx for E.COli Leukocytosis improved Did spiked fever F/u bcx Will continue with rocephin Resume ivf for hydration       Hyperglycemia, suspect new onset diabetes   History of gestational diabetes mellitus, not pregnant Diabetic educator consulted, see note A1c 11.7 Will start metformin 500mg  qd, will need adjustment as outpt. Consulted TOC   Electrolyte disturbance: Hyponatremia and hypokalemia -Secondary to nausea and  vomiting Supplemental and stable.    Anemia of uncertain acuity, suspect chronic -Hemoglobin 8.7.  [facility] -Suspect iron deficiency, with MCV of 59 Still having menstral cycle, likely the cause. 10/1-Hg 7.7 , but likely dilutional. Continue to monitor.  Will start Fesulfate      DVT prophylaxis: lovenox Code Status:full Family Communication: none at  bedside Status is: Inpatient  Remains inpatient appropriate because:IV treatments appropriate due to intensity of illness or inability to take PO   Dispo: The patient is from: Home              Anticipated d/c is to: Home              Anticipated d/c date is: 2 days              Patient currently is not medically stable to d/c.            LOS: 2 days   Time spent: 35 min with >50% on coc    Lynn Ito, MD [facility] Pager [phone]  If 7PM-7AM, please contact night-coverage [email or web address] Password [facility] 12/14/2019, 9:36 PM

## 2019-12-14 NOTE — Progress Notes (Signed)
Inpatient Diabetes Program Recommendations  AACE/ADA: New Consensus Statement on Inpatient Glycemic Control (2015)  Target Ranges:  Prepandial:   less than 140 mg/dL      Peak postprandial:   less than 180 mg/dL (1-2 hours)      Critically ill patients:  140 - 180 mg/dL   Results for Regina Morris, Regina Morris (MRN [id number]) as of 12/14/2019 11:40  Ref. Range 12/13/2019 09:44 12/13/2019 12:40 12/13/2019 16:58 12/13/2019 20:03  Glucose-Capillary Latest Ref Range: 70 - 99 mg/dL 007 (H)  8 units NOVOLOG  198 (H)  3 units NOVOLOG @2 :29pm 218 (H)  5 units NOVOLOG  304 (H)  4 units NOVOLOG    Results for Regina Morris, Regina Morris (MRN Hyman Bower) as of 12/14/2019 11:40  Ref. Range 12/14/2019 07:35  Glucose-Capillary Latest Ref Range: 70 - 99 mg/dL 02/13/2020 (H)  5 units NOVOLOG    Results for Regina Morris, Regina Morris (MRN Hyman Bower) as of 12/14/2019 11:40  Ref. Range 12/12/2019 21:57  Hemoglobin A1C Latest Ref Range: 4.8 - 5.6 % 11.7 (H)  (289 mg/dl)    Admit with: Acute pyelonephritis/ Sepsis/ Hyperglycemia, suspect new onset diabetes  History: Gestational Diabetes   Current Orders: Novolog Moderate Correction Scale/ SSI (0-15 units) TID AC + HS      Metformin 500 mg Daily    Spoke with Dr. 12/14/2019 by phone earlier this afternoon.  Plan is to d/c pt home with Metformin.  TOC team to get pt free CBG meter prior to d/c.  Spoke with pt about new diagnosis today using [employer]--Interpreter Tannia #[id number].  Discussed A1C results with her and explained what an A1C is, basic pathophysiology of DM Type 2, basic home care, basic diabetes diet nutrition principles, importance of checking CBGs and maintaining good CBG control to prevent long-term and short-term complications.  Reviewed signs and symptoms of hyperglycemia and hypoglycemia and how to treat hypoglycemia at home.  Also reviewed blood sugar goals and A1c goals for home.  Also discussed w/ pt going home on Metformin (what it is, how it works, side  effects, etc).  Pt asked me if she will need the Metformin long-term--Explained to pt that she will need the Metformin indefinitely at this time, however, with regular follow up care and lifestyle changes, pt may be able to stop the Metformin in the future (however, that all depends on how her body adjusts to the meds and the lifestyle changes).  Explained to pt that diabetes never goes away but that the way we manage it can change over time.  RNs to provide ongoing basic DM education at bedside with this patient.  Have ordered educational booklet.  Have also placed RD consult for DM diet education for this patient.     --Will follow patient during hospitalization--  Marylu Lund RN, MSN, CDE Diabetes Coordinator Inpatient Glycemic Control Team Team Pager: [phone] (8a-5p)

## 2019-12-14 NOTE — Progress Notes (Signed)
Mobility Specialist - Progress Note   12/14/19 1516  Mobility  Activity Ambulated in room;Ambulated in hall  Level of Assistance Independent (CGA for safety)  Assistive Device None  Distance Ambulated (ft) 385 ft  Mobility Response Tolerated well  Mobility performed by Mobility specialist  $Mobility charge 1 Mobility    Pre-mobility: 85 HR, 117/76 BP, 100% SpO2 Post-mobility: 82 HR, 124/79 BP, 100% SpO2   Pt laying in bed w/ nurse present in room upon arrival. Interpreter present via VRI pole: his name was Lawanda Cousins (ID #[id number]). Pt agreed to session. Pt independent w/ bed mobility. Pt ambulated ~385' total in room and hallway independently. CGA utilized for safety. No LOB noted. No c/o pain. Pt had a steady gait t/o session. Overall, pt tolerated session very well. She was very pleasant t/o session. Interpreter logged off of VRI pole. Pt left sitting EOB w/ all needs placed in reach.     Eiko Mcgowen Mobility Specialist  12/14/19, 3:20 PM

## 2019-12-14 NOTE — TOC Progression Note (Signed)
Transition of Care [facility]) - Progression Note    Patient Details  Name: Regina Morris MRN: [id number] Date of Birth: [birth year]  Transition [facility]) CM/SW Contact  Elliot Gault, LCSW Phone Number: 12/14/2019, 2:34 PM  Clinical Narrative:      TOC following. Pt not stable for dc today per MD. MD anticipating dc tomorrow. Requested that dc medications be prescribed to Medication Management today as they are not available on the weekend. MD only ordered the antibiotic. TOC CMA arranged to pick up dc med and it was then given to pt's RN to supply to pt at dc. TOC also provided a glucometer and supplies for home use to RN to give pt at dc.  If pt has any additional medication needs at dc, weekend TOC can assist with [facility] voucher.    Referral was made to the [facility] yesterday and pt was made aware to be sure to answer the call when they contact her to schedule follow up appointments.  Weekend TOC will be available to assist if needed.  Expected Discharge Plan and Services                                                 Social Determinants of Health (SDOH) Interventions    Readmission Risk Interventions Readmission Risk Prevention Plan 12/14/2019  Medication Screening Complete  Transportation Screening Complete  Some recent data might be hidden

## 2019-12-14 NOTE — Plan of Care (Signed)
  RD consulted for nutrition education regarding diabetes.   Lab Results  Component Value Date   HGBA1C 11.7 (H) 12/12/2019   Met with patient at bedside. Used [employer] video interpreter Effie Shy [phone]). Patient reports she has a good appetite that is unchanged from baseline. She reports that she typically eats 3 meals per day at home. She may have chicken, fish, eggs, rice, vegetables. This is a new diagnosis of DM but pt did have GDM in the past.   RD provided "Carbohydrate Counting for People with Diabetes" handout from the Academy of Nutrition and Dietetics. Discussed different food groups and their effects on blood sugar, emphasizing carbohydrate-containing foods. Provided list of carbohydrates and recommended serving sizes of common foods.  Discussed importance of controlled and consistent carbohydrate intake throughout the day. Provided examples of ways to balance meals/snacks and encouraged intake of high-fiber, whole grain complex carbohydrates. Teach back method used.  Expect good compliance.  Current diet order is regular, patient is consuming approximately 100% of meals at this time. Labs and medications reviewed. No further nutrition interventions warranted at this time. RD contact information provided. If additional nutrition issues arise, please re-consult RD.  Jacklynn Barnacle, MS, RD, LDN Pager number available on Amion

## 2019-12-15 LAB — GLUCOSE, CAPILLARY: Glucose-Capillary: 192 mg/dL — ABNORMAL HIGH (ref 70–99)

## 2019-12-15 LAB — CBC
HCT: 28.6 % — ABNORMAL LOW (ref 36.0–46.0)
Hemoglobin: 8.2 g/dL — ABNORMAL LOW (ref 12.0–15.0)
MCH: 17.3 pg — ABNORMAL LOW (ref 26.0–34.0)
MCHC: 28.7 g/dL — ABNORMAL LOW (ref 30.0–36.0)
MCV: 60.5 fL — ABNORMAL LOW (ref 80.0–100.0)
Platelets: 412 10*3/uL — ABNORMAL HIGH (ref 150–400)
RBC: 4.73 MIL/uL (ref 3.87–5.11)
RDW: 18.8 % — ABNORMAL HIGH (ref 11.5–15.5)
WBC: 9 10*3/uL (ref 4.0–10.5)
nRBC: 0 % (ref 0.0–0.2)

## 2019-12-15 MED ORDER — METFORMIN HCL 500 MG PO TABS: 500.0000 mg | ORAL_TABLET | Freq: Every day | ORAL | 0 refills | Status: AC

## 2019-12-15 MED ORDER — NYSTATIN 100000 UNIT/ML MT SUSP: 5.0000 mL | Freq: Four times a day (QID) | OROMUCOSAL | Status: DC

## 2019-12-15 MED ORDER — FERROUS SULFATE 325 (65 FE) MG PO TABS: 325.0000 mg | ORAL_TABLET | Freq: Every day | ORAL | 0 refills | Status: AC

## 2019-12-15 NOTE — TOC Transition Note (Signed)
Transition of Care [facility]) - CM/SW Discharge Note   Patient Details  Name: Regina Morris MRN: [id number] Date of Birth: 1980-04-29  Transition of Care [facility]: [name]) CM/SW Contact:  Eilleen Kempf, LCSW Phone Number: 12/15/2019, 9:30 AM   Clinical Narrative:    CSW received a call from Dr. Marylu Lund stating the patient will be discharged this morning and that she had sent her medication to med management on Friday. CSW called Johnell Comings to confirm the medication was delivered to the patient in room 215. CSW messaged Dr. Marylu Lund to confirm she has medication for discharge.         Patient Goals and CMS Choice        Discharge Placement                       Discharge Plan and Services                                     Social Determinants of Health (SDOH) Interventions     Readmission Risk Interventions Readmission Risk Prevention Plan 12/14/2019  Medication Screening Complete  Transportation Screening Complete  Some recent data might be hidden

## 2019-12-15 NOTE — Progress Notes (Signed)
Provided education to both pt and husband on how to obtain a blood sugar using pt's new diabetic pen. Pt was able to demonstrate understanding using the teach back method, and had no concerns at this time.

## 2019-12-15 NOTE — Progress Notes (Signed)
Regina Morris Regina Morris A and O x4. VSS. Pt tolerating diet well. No complaints of nausea or vomiting. IV removed intact, prescriptions given. Pt voices understanding of discharge instructions with no further questions. Patient discharged via wheelchair to wait on ride downstairs. Stated her ride would take a while and she wanted to wait down there for them.  Allergies as of 12/15/2019      Reactions   Penicillins Rash      Medication List    TAKE these medications   ciprofloxacin 500 MG tablet Commonly known as: CIPRO Take 1 tablet (500 mg total) by mouth 2 (two) times daily for 7 days.   ferrous sulfate 325 (65 FE) MG tablet Take 1 tablet (325 mg total) by mouth daily with breakfast. Start taking on: December 16, 2019   metFORMIN 500 MG tablet Commonly known as: GLUCOPHAGE Take 1 tablet (500 mg total) by mouth daily with breakfast.       Vitals:   12/15/19 0503 12/15/19 0809  BP: 103/65 105/73  Pulse: 70 71  Resp: 17 18  Temp: 98.2 F (36.8 C) 98.1 F (36.7 C)  SpO2: 100% 98%    Bertha Stakes

## 2019-12-15 NOTE — Discharge Summary (Signed)
Regina Morris Texta ZOX:[id number] DOB: 08/10/1980 DOA: 12/12/2019  PCP: Patient, No Pcp Per  Admit date: 12/12/2019 Discharge date: 12/15/2019  Admitted From: Home Disposition: Home  Recommendations for Outpatient Follow-up:  1. Follow up with PCP in 1 week 2. Please obtain BMP/CBC in one week 3. Was given good Rx card for Metformin which will cost her $4 the rest of the medication was given to med management fingerstick checks was given to her at bedside   Discharge Condition:Stable CODE STATUS: Full Diet recommendation: Diabetic/carb modified    Brief/Interim Summary: Regina Morris Textais a [age] y.o.femalewith medical history significant forgestational diabetes and hypertensionand nephrolithiasis, who presents to the emergency room with a 5-day history of fever low back pain radiating to the lower abdomen, nausea and vomiting as well as urinary frequency.  CT scanning revealed Bilateral perinephric stranding with a mildly edematous appearance of both kidneys.  Full results below While in the hospital she was found with hyperglycemia, A1c was 11.7.  Hyperglycemia, suspect new onset diabetes History of gestational diabetes mellitus, not pregnant A1c 11.7 Started on Metformin 500 mg daily, will need to check fingersticks equipment was given to her.   I stressed to her that she needs to follow-up with the PCP after she gets established. TOC was consulted   Electrolyte disturbance: Hyponatremia and hypokalemia -Secondary to nausea and vomiting Supplemental and stable.    Anemia of uncertain acuity, suspect chronic -Hemoglobin 8.7. [facility] -Suspect iron deficiency, with MCV of 59 Still having menstral cycle, likely the cause. Started on iron pills, will need to f/u with pcp   Discharge Diagnoses:  Principal Problem:   Acute pyelonephritis Active Problems:   Sepsis (HCC)   History of gestational diabetes mellitus, not pregnant    Hyperglycemia   Anemia, unspecified   Electrolyte disturbance    Discharge Instructions  Discharge Instructions    Call MD for:  persistant dizziness or light-headedness   Complete by: As directed    Diet - low sodium heart healthy   Complete by: As directed    Diet Carb Modified   Complete by: As directed    Discharge instructions   Complete by: As directed    Follow up with primary care in one week   Increase activity slowly   Complete by: As directed      Allergies as of 12/15/2019      Reactions   Penicillins Rash      Medication List    TAKE these medications   ciprofloxacin 500 MG tablet Commonly known as: CIPRO Take 1 tablet (500 mg total) by mouth 2 (two) times daily for 7 days.   ferrous sulfate 325 (65 FE) MG tablet Take 1 tablet (325 mg total) by mouth daily with breakfast. Start taking on: December 16, 2019   metFORMIN 500 MG tablet Commonly known as: GLUCOPHAGE Take 1 tablet (500 mg total) by mouth daily with breakfast.       Follow-up Information    [facility] Follow up.   Specialty: Primary Care Why: they will call you to schedule follow up care Contact information: [address] [place] [id number] [phone]             Allergies  Allergen Reactions  . Penicillins Rash    Consultations:     Procedures/Studies: DG Chest Portable 1 View  Result Date: 12/12/2019 CLINICAL DATA:  Cough and fever EXAM: PORTABLE CHEST 1  VIEW COMPARISON:  None. FINDINGS: Diminished lung volumes. No focal consolidation, pleural effusion or pneumothorax. Normal cardiomediastinal silhouette IMPRESSION: Low lung volumes. No focal pulmonary infiltrate. Electronically Signed   By: Jasmine Pang M.D.   On: 12/12/2019 20:53   CT Renal Stone Study  Result Date: 12/12/2019 CLINICAL DATA:  Flank pain, stone disease suspected, pregnant patient EXAM: CT ABDOMEN AND PELVIS WITHOUT CONTRAST TECHNIQUE:  Multidetector CT imaging of the abdomen and pelvis was performed following the standard protocol without IV contrast. COMPARISON:  None. FINDINGS: Lower chest: Lung bases are clear. Normal heart size. No pericardial effusion. Hepatobiliary: No visible focal liver lesions on this unenhanced CT. Smooth liver surface contour. Normal hepatic attenuation. Gallbladder and biliary tree are unremarkable. Pancreas: Partial fatty replacement of the pancreas. No pancreatic ductal dilatation or surrounding inflammatory changes. Spleen: Normal in size. No concerning splenic lesions. Adrenals/Urinary Tract: Normal adrenal glands. No visible or contour deforming renal lesions are seen. Bilateral perinephric stranding with a mildly edematous appearance of both kidneys. There is slight prominence of the collecting systems bilaterally without visible calculus or frank hydronephrosis. Mild circumferential bladder wall thickening and perivesicular hazy stranding is noted as well. No visible bladder calculi or debris. Stomach/Bowel: Distal esophagus, stomach and duodenal sweep are unremarkable. No small bowel wall thickening or dilatation. No evidence of obstruction. A normal appendix is visualized. No colonic dilatation or wall thickening. Vascular/Lymphatic: No significant vascular findings are present. Few slightly prominent clustered though nonenlarged lymph nodes are present in the right lower quadrant. No pathologically enlarged nodes are present in the abdomen or pelvis. Reproductive: Normal appearance of the uterus and adnexal structures. Other: No abdominopelvic free fluid or free gas. No bowel containing hernias. Musculoskeletal: No acute osseous abnormality or suspicious osseous lesion. IMPRESSION: 1. Bilateral perinephric stranding with a mildly edematous appearance of both kidneys. There is slight prominence of the collecting systems bilaterally without visible calculus or frank hydronephrosis. Mild circumferential bladder  wall thickening and perivesicular hazy stranding is noted as well. Findings are concerning for ascending urinary tract infection/pyelonephritis. Correlate with urinalysis. 2. Few slightly prominent clustered though nonenlarged lymph nodes in the right lower quadrant, nonspecific, though possibly reactive related to mesenteric adenitis. 3. Normal appendix. Electronically Signed   By: Kreg Shropshire M.D.   On: 12/12/2019 21:42       Subjective: Feels better, no complaints  Discharge Exam: Vitals:   12/15/19 0503 12/15/19 0809  BP: 103/65 105/73  Pulse: 70 71  Resp: 17 18  Temp: 98.2 F (36.8 C) 98.1 F (36.7 C)  SpO2: 100% 98%   Vitals:   12/14/19 2028 12/14/19 2322 12/15/19 0503 12/15/19 0809  BP: 114/74 126/78 103/65 105/73  Pulse: 85 79 70 71  Resp: Temp: 98.1 F (36.7 C) 98.2 F (36.8 C) 98.2 F (36.8 C) 98.1 F (36.7 C)  TempSrc: Oral Oral Oral Oral  SpO2: 99% 100% 100% 98%    General: Pt is alert, awake, not in acute distress Cardiovascular: RRR, S1/S2 +, no rubs, no gallops Respiratory: CTA bilaterally, no wheezing, no rhonchi Abdominal: Soft, NT, ND, bowel sounds + Extremities: no edema, no cyanosis    The results of significant diagnostics from this hospitalization (including imaging, microbiology, ancillary and laboratory) are listed below for reference.     Microbiology: Recent Results (from the past 240 hour(s))  Urine culture     Status: Abnormal   Collection Time: 12/12/19  7:30 PM   Specimen: Urine, Random  Result Value Ref  Range Status   Specimen Description   Final    URINE, RANDOM Performed at [facility], [address] [place], [place] [address]    Special Requests   Final    NONE Performed at [facility], [address] [place], [place] [address]    Culture >=100,000 COLONIES/mL ESCHERICHIA COLI (A)  Final   Report Status 12/14/2019 FINAL  Final   Organism ID, Bacteria ESCHERICHIA COLI (A)  Final       Susceptibility   Escherichia coli - MIC*    AMPICILLIN <=2 SENSITIVE Sensitive     CEFAZOLIN <=4 SENSITIVE Sensitive     CEFTRIAXONE <=0.25 SENSITIVE Sensitive     CIPROFLOXACIN <=0.25 SENSITIVE Sensitive     GENTAMICIN <=1 SENSITIVE Sensitive     IMIPENEM <=0.25 SENSITIVE Sensitive     NITROFURANTOIN <=16 SENSITIVE Sensitive     TRIMETH/SULFA <=20 SENSITIVE Sensitive     AMPICILLIN/SULBACTAM <=2 SENSITIVE Sensitive     PIP/TAZO <=4 SENSITIVE Sensitive     * >=100,000 COLONIES/mL ESCHERICHIA COLI  Culture, blood (routine x 2)     Status: None (Preliminary result)   Collection Time: 12/12/19  8:24 PM   Specimen: BLOOD  Result Value Ref Range Status   Specimen Description BLOOD RIGHT HAND  Final   Special Requests   Final    BOTTLES DRAWN AEROBIC AND ANAEROBIC Blood Culture adequate volume   Culture   Final    NO GROWTH 3 DAYS Performed at [facility], [address], [place], [place] [address]    Report Status PENDING  Incomplete  Culture, blood (routine x 2)     Status: None (Preliminary result)   Collection Time: 12/12/19  8:25 PM   Specimen: BLOOD  Result Value Ref Range Status   Specimen Description BLOOD LEFT AC  Final   Special Requests   Final    BOTTLES DRAWN AEROBIC AND ANAEROBIC Blood Culture adequate volume   Culture   Final    NO GROWTH 3 DAYS Performed at [facility], [address], [place], [place] [address]    Report Status PENDING  Incomplete  Respiratory Panel by RT PCR (Flu A&B, Covid) - Nasopharyngeal Swab     Status: None   Collection Time: 12/12/19  9:57 PM   Specimen: Nasopharyngeal Swab  Result Value Ref Range Status   SARS Coronavirus 2 by RT PCR NEGATIVE NEGATIVE Final    Comment: (NOTE) SARS-CoV-2 target nucleic acids are NOT DETECTED.  The SARS-CoV-2 RNA is generally detectable in upper respiratoy specimens during the acute phase of infection. The lowest concentration of SARS-CoV-2 viral copies this assay can detect  is 131 copies/mL. A negative result does not preclude SARS-Cov-2 infection and should not be used as the sole basis for treatment or other patient management decisions. A negative result may occur with  improper specimen collection/handling, submission of specimen other than nasopharyngeal swab, presence of viral mutation(s) within the areas targeted by this assay, and inadequate number of viral copies (<131 copies/mL). A negative result must be combined with clinical observations, patient history, and epidemiological information. The expected result is Negative.  Fact Sheet for Patients:  https://www.moore.com/  Fact Sheet for Healthcare Providers:  https://www.young.biz/  This test is no t yet approved or cleared by the [place] FDA and  has been authorized for detection and/or diagnosis of SARS-CoV-2 by FDA under an Emergency Use Authorization (EUA). This EUA will remain  in effect (meaning this test can be used) for  the duration of the COVID-19 declaration under Section 564(b)(1) of the Act, 21 U.S.C. section 360bbb-3(b)(1), unless the authorization is terminated or revoked sooner.     Influenza A by PCR NEGATIVE NEGATIVE Final   Influenza B by PCR NEGATIVE NEGATIVE Final    Comment: (NOTE) The Xpert Xpress SARS-CoV-2/FLU/RSV assay is intended as an aid in  the diagnosis of influenza from Nasopharyngeal swab specimens and  should not be used as a sole basis for treatment. Nasal washings and  aspirates are unacceptable for Xpert Xpress SARS-CoV-2/FLU/RSV  testing.  Fact Sheet for Patients: https://www.moore.com/  Fact Sheet for Healthcare Providers: https://www.young.biz/  This test is not yet approved or cleared by the [place] FDA and  has been authorized for detection and/or diagnosis of SARS-CoV-2 by  FDA under an Emergency Use Authorization (EUA). This EUA will remain  in effect  (meaning this test can be used) for the duration of the  Covid-19 declaration under Section 564(b)(1) of the Act, 21  U.S.C. section 360bbb-3(b)(1), unless the authorization is  terminated or revoked. Performed at [facility], [address] [place], [place] [address]      Labs: BNP (last 3 results) No results for input(s): BNP in the last 8760 hours. Basic Metabolic Panel: Recent Labs  Lab 12/12/19 2031 12/13/19 0601 12/14/19 0455  NA 131* 137 137  K 3.1* 3.5 3.7  CL 97* 101 104  CO2 23 25 25   GLUCOSE 312* 201* 194*  BUN 8 5* 6  CREATININE 0.81 0.54 0.63  CALCIUM 8.1* 7.9* 7.9*   Liver Function Tests: Recent Labs  Lab 12/12/19 2031  AST 24  ALT 18  ALKPHOS 73  BILITOT 0.5  PROT 6.7  ALBUMIN 2.9*   No results for input(s): LIPASE, AMYLASE in the last 168 hours. No results for input(s): AMMONIA in the last 168 hours. CBC: Recent Labs  Lab 12/12/19 2031 12/13/19 0601 12/14/19 0455 12/15/19 0535  WBC 10.9* 11.7* 9.9 9.0  NEUTROABS 9.2*  --   --   --   HGB 8.7* 8.1* 7.7* 8.2*  HCT 28.9* 28.5* 27.1* 28.6*  MCV 59.1* 60.6* 62.2* 60.5*  PLT 328 358 345 412*   Cardiac Enzymes: No results for input(s): CKTOTAL, CKMB, CKMBINDEX, TROPONINI in the last 168 hours. BNP: Invalid input(s): POCBNP CBG: Recent Labs  Lab 12/14/19 0735 12/14/19 1137 12/14/19 1633 12/14/19 2115 12/15/19 0728  GLUCAP 206* 183* 172* 311* 192*   D-Dimer No results for input(s): DDIMER in the last 72 hours. Hgb A1c Recent Labs    12/12/19 2157  HGBA1C 11.7*   Lipid Profile No results for input(s): CHOL, HDL, LDLCALC, TRIG, CHOLHDL, LDLDIRECT in the last 72 hours. Thyroid function studies No results for input(s): TSH, T4TOTAL, T3FREE, THYROIDAB in the last 72 hours.  Invalid input(s): FREET3 Anemia work up Recent Labs    12/12/19 2031 12/12/19 2157  VITAMINB12  --  578  FOLATE 15.4  --   FERRITIN 13  --   TIBC 343  --   IRON 7*  --    Urinalysis     Component Value Date/Time   COLORURINE YELLOW (A) 12/12/2019 1930   APPEARANCEUR CLOUDY (A) 12/12/2019 1930   LABSPEC 1.021 12/12/2019 1930   PHURINE 7.0 12/12/2019 1930   GLUCOSEU >=500 (A) 12/12/2019 1930   HGBUR SMALL (A) 12/12/2019 1930   BILIRUBINUR NEGATIVE 12/12/2019 1930   KETONESUR NEGATIVE 12/12/2019 1930   PROTEINUR >=300 (A) 12/12/2019 1930   NITRITE POSITIVE (A) 12/12/2019 1930  LEUKOCYTESUR MODERATE (A) 12/12/2019 1930   Sepsis Labs Invalid input(s): PROCALCITONIN,  WBC,  LACTICIDVEN Microbiology Recent Results (from the past 240 hour(s))  Urine culture     Status: Abnormal   Collection Time: 12/12/19  7:30 PM   Specimen: Urine, Random  Result Value Ref Range Status   Specimen Description   Final    URINE, RANDOM Performed at [facility], [address] [place], [place] [address]    Special Requests   Final    NONE Performed at [facility], [address] [place], [place] [address]    Culture >=100,000 COLONIES/mL ESCHERICHIA COLI (A)  Final   Report Status 12/14/2019 FINAL  Final   Organism ID, Bacteria ESCHERICHIA COLI (A)  Final      Susceptibility   Escherichia coli - MIC*    AMPICILLIN <=2 SENSITIVE Sensitive     CEFAZOLIN <=4 SENSITIVE Sensitive     CEFTRIAXONE <=0.25 SENSITIVE Sensitive     CIPROFLOXACIN <=0.25 SENSITIVE Sensitive     GENTAMICIN <=1 SENSITIVE Sensitive     IMIPENEM <=0.25 SENSITIVE Sensitive     NITROFURANTOIN <=16 SENSITIVE Sensitive     TRIMETH/SULFA <=20 SENSITIVE Sensitive     AMPICILLIN/SULBACTAM <=2 SENSITIVE Sensitive     PIP/TAZO <=4 SENSITIVE Sensitive     * >=100,000 COLONIES/mL ESCHERICHIA COLI  Culture, blood (routine x 2)     Status: None (Preliminary result)   Collection Time: 12/12/19  8:24 PM   Specimen: BLOOD  Result Value Ref Range Status   Specimen Description BLOOD RIGHT HAND  Final   Special Requests   Final    BOTTLES DRAWN AEROBIC AND ANAEROBIC Blood Culture adequate volume    Culture   Final    NO GROWTH 3 DAYS Performed at [facility], [address], [place], [place] [address]    Report Status PENDING  Incomplete  Culture, blood (routine x 2)     Status: None (Preliminary result)   Collection Time: 12/12/19  8:25 PM   Specimen: BLOOD  Result Value Ref Range Status   Specimen Description BLOOD LEFT AC  Final   Special Requests   Final    BOTTLES DRAWN AEROBIC AND ANAEROBIC Blood Culture adequate volume   Culture   Final    NO GROWTH 3 DAYS Performed at [facility], [address], [place], [place] [address]    Report Status PENDING  Incomplete  Respiratory Panel by RT PCR (Flu A&B, Covid) - Nasopharyngeal Swab     Status: None   Collection Time: 12/12/19  9:57 PM   Specimen: Nasopharyngeal Swab  Result Value Ref Range Status   SARS Coronavirus 2 by RT PCR NEGATIVE NEGATIVE Final    Comment: (NOTE) SARS-CoV-2 target nucleic acids are NOT DETECTED.  The SARS-CoV-2 RNA is generally detectable in upper respiratoy specimens during the acute phase of infection. The lowest concentration of SARS-CoV-2 viral copies this assay can detect is 131 copies/mL. A negative result does not preclude SARS-Cov-2 infection and should not be used as the sole basis for treatment or other patient management decisions. A negative result may occur with  improper specimen collection/handling, submission of specimen other than nasopharyngeal swab, presence of viral mutation(s) within the areas targeted by this assay, and inadequate number of viral copies (<131 copies/mL). A negative result must be combined with clinical observations, patient history, and epidemiological information. The expected result is Negative.  Fact Sheet for Patients:  https://www.moore.com/  Fact Sheet for Healthcare Providers:  https://www.young.biz/  This test is no t yet approved or cleared by the [place] and  has been  authorized for detection and/or diagnosis of SARS-CoV-2 by FDA under an Emergency Use Authorization (EUA). This EUA will remain  in effect (meaning this test can be used) for the duration of the COVID-19 declaration under Section 564(b)(1) of the Act, 21 U.S.C. section 360bbb-3(b)(1), unless the authorization is terminated or revoked sooner.     Influenza A by PCR NEGATIVE NEGATIVE Final   Influenza B by PCR NEGATIVE NEGATIVE Final    Comment: (NOTE) The Xpert Xpress SARS-CoV-2/FLU/RSV assay is intended as an aid in  the diagnosis of influenza from Nasopharyngeal swab specimens and  should not be used as a sole basis for treatment. Nasal washings and  aspirates are unacceptable for Xpert Xpress SARS-CoV-2/FLU/RSV  testing.  Fact Sheet for Patients: https://www.moore.com/  Fact Sheet for Healthcare Providers: https://www.young.biz/  This test is not yet approved or cleared by the [place] FDA and  has been authorized for detection and/or diagnosis of SARS-CoV-2 by  FDA under an Emergency Use Authorization (EUA). This EUA will remain  in effect (meaning this test can be used) for the duration of the  Covid-19 declaration under Section 564(b)(1) of the Act, 21  U.S.C. section 360bbb-3(b)(1), unless the authorization is  terminated or revoked. Performed at [facility], [address] [place], [place] [address]      Time coordinating discharge: Over 30 minutes  SIGNED:   Lynn Ito, MD  [facility] 12/15/2019, 3:19 PM Pager   If 7PM-7AM, please contact night-coverage [email or web address] Password [email or web address]

## 2019-12-17 LAB — CULTURE, BLOOD (ROUTINE X 2)
Culture: NO GROWTH
Culture: NO GROWTH
Special Requests: ADEQUATE
Special Requests: ADEQUATE

## 2019-12-20 ENCOUNTER — Telehealth: Payer: Self-pay | Admitting: Gerontology

## 2019-12-20 NOTE — Telephone Encounter (Addendum)
Pt was made aware of all documents she needs to become a new pt. She will be dropping off most paperwork the week of the 10th of oct 2021   ----- Message from Leda Min, [place] sent at 12/13/2019  3:37 PM EDT ----- Regarding: FW: [facility] - Spanish See below ----- Message ----- From: Ova Freshwater Sent: 12/13/2019   3:14 PM EDT To: Josefine Class, CMA Subject: [facility] - Spanish         Good afternoon,  I met with this patient today.  She has been diagnosed with diabetes and is in dire need of a PCP and pharmacy.  She only speaks [place].  I told her to make sure she answers the phone when you call.  Her main concern is she doesn't work, and she and her husband are undocumented and so they will not be able to provide tax information.  If you could call her in the next couple of days she'll be at the hospital and I can assist with any questions she has.  Let me know if you need anything else.  Best,  [employer] CSW. LCSW-A, MSW, MA

## 2019-12-27 ENCOUNTER — Telehealth: Payer: Self-pay | Admitting: Pharmacy Technician

## 2019-12-27 NOTE — Telephone Encounter (Signed)
Patient received a 30 day supply of Ciproflixacin on 12/14/19.  Provided patient with new patient packet to obtain ongoing [facility] services.  [facility] must receive requested financial documentation within 30 days in order to determine eligibility and provide additional medication assistance.  Sherilyn Dacosta Care Manager [facility]

## 2020-02-06 ENCOUNTER — Telehealth: Payer: Self-pay | Admitting: Pharmacist

## 2020-02-06 NOTE — Telephone Encounter (Signed)
Patient failed to provide requested 2021 financial documentation. No additional medication assistance will be provided by [facility] without the required proof of income documentation. Patient notified by letter Debra Cheek Administrative Assistant [facility] 

## 2020-08-06 ENCOUNTER — Emergency Department
Admission: EM | Admit: 2020-08-06 | Discharge: 2020-08-07 | Disposition: A | Discharge status: Other Acute Inpatient Hospital | Payer: Self-pay | Attending: Emergency Medicine | Admitting: Emergency Medicine

## 2020-08-06 ENCOUNTER — Emergency Department: Payer: Self-pay

## 2020-08-06 ENCOUNTER — Encounter: Payer: Self-pay | Admitting: Emergency Medicine

## 2020-08-06 ENCOUNTER — Other Ambulatory Visit: Payer: Self-pay

## 2020-08-06 DIAGNOSIS — Y900 Blood alcohol level of less than 20 mg/100 ml: Secondary | ICD-10-CM | POA: Insufficient documentation

## 2020-08-06 DIAGNOSIS — R059 Cough, unspecified: Secondary | ICD-10-CM | POA: Insufficient documentation

## 2020-08-06 DIAGNOSIS — R112 Nausea with vomiting, unspecified: Secondary | ICD-10-CM | POA: Insufficient documentation

## 2020-08-06 DIAGNOSIS — I639 Cerebral infarction, unspecified: Secondary | ICD-10-CM | POA: Insufficient documentation

## 2020-08-06 DIAGNOSIS — Z28310 Unvaccinated for covid-19: Secondary | ICD-10-CM | POA: Insufficient documentation

## 2020-08-06 DIAGNOSIS — Z8632 Personal history of gestational diabetes: Secondary | ICD-10-CM | POA: Insufficient documentation

## 2020-08-06 DIAGNOSIS — R509 Fever, unspecified: Secondary | ICD-10-CM | POA: Insufficient documentation

## 2020-08-06 DIAGNOSIS — Z7984 Long term (current) use of oral hypoglycemic drugs: Secondary | ICD-10-CM | POA: Insufficient documentation

## 2020-08-06 DIAGNOSIS — Z20822 Contact with and (suspected) exposure to covid-19: Secondary | ICD-10-CM | POA: Insufficient documentation

## 2020-08-06 DIAGNOSIS — R58 Hemorrhage, not elsewhere classified: Secondary | ICD-10-CM

## 2020-08-06 LAB — COMPREHENSIVE METABOLIC PANEL
ALT: 13 U/L (ref 0–44)
AST: 16 U/L (ref 15–41)
Albumin: 3.5 g/dL (ref 3.5–5.0)
Alkaline Phosphatase: 89 U/L (ref 38–126)
Anion gap: 15 (ref 5–15)
BUN: 9 mg/dL (ref 6–20)
CO2: 20 mmol/L — ABNORMAL LOW (ref 22–32)
Calcium: 8.6 mg/dL — ABNORMAL LOW (ref 8.9–10.3)
Chloride: 100 mmol/L (ref 98–111)
Creatinine, Ser: 0.61 mg/dL (ref 0.44–1.00)
GFR, Estimated: >60 mL/min (ref >60)
Glucose, Bld: 290 mg/dL — ABNORMAL HIGH (ref 70–99)
Potassium: 3.7 mmol/L (ref 3.5–5.1)
Sodium: 135 mmol/L (ref 135–145)
Total Bilirubin: 1.1 mg/dL (ref 0.3–1.2)
Total Protein: 7.7 g/dL (ref 6.5–8.1)

## 2020-08-06 LAB — CBC
HCT: 34.2 % — ABNORMAL LOW (ref 36.0–46.0)
Hemoglobin: 9.3 g/dL — ABNORMAL LOW (ref 12.0–15.0)
MCH: 16.4 pg — ABNORMAL LOW (ref 26.0–34.0)
MCHC: 27.2 g/dL — ABNORMAL LOW (ref 30.0–36.0)
MCV: 60.4 fL — ABNORMAL LOW (ref 80.0–100.0)
Platelets: 715 10*3/uL — ABNORMAL HIGH (ref 150–400)
RBC: 5.66 MIL/uL — ABNORMAL HIGH (ref 3.87–5.11)
RDW: 19.6 % — ABNORMAL HIGH (ref 11.5–15.5)
WBC: 20.5 10*3/uL — ABNORMAL HIGH (ref 4.0–10.5)
nRBC: 0 % (ref 0.0–0.2)

## 2020-08-06 LAB — LIPASE, BLOOD: Lipase: 21 U/L (ref 11–51)

## 2020-08-06 LAB — CBG MONITORING, ED: Glucose-Capillary: 260 mg/dL — ABNORMAL HIGH (ref 70–99)

## 2020-08-06 MED ORDER — ONDANSETRON 4 MG PO TBDP
4.0000 mg | ORAL_TABLET | Freq: Once | ORAL | Status: AC | PRN
  Administered 2020-08-06: 4 mg via ORAL
  Filled 2020-08-06: qty 1

## 2020-08-06 MED ORDER — DIPHENHYDRAMINE HCL 50 MG/ML IJ SOLN
12.5000 mg | Freq: Once | INTRAMUSCULAR | Status: AC
  Administered 2020-08-07: 12.5 mg via INTRAVENOUS
  Filled 2020-08-06: qty 1

## 2020-08-06 MED ORDER — METOCLOPRAMIDE HCL 5 MG/ML IJ SOLN
10.0000 mg | Freq: Once | INTRAMUSCULAR | Status: AC
  Administered 2020-08-07: 10 mg via INTRAVENOUS
  Filled 2020-08-06: qty 2

## 2020-08-06 MED ORDER — SODIUM CHLORIDE 0.9 % IV BOLUS
1000.0000 mL | Freq: Once | INTRAVENOUS | Status: AC
  Administered 2020-08-07: 1000 mL via INTRAVENOUS

## 2020-08-06 NOTE — ED Provider Notes (Addendum)
[facility] Emergency Department Provider Note  ____________________________________________   Event Date/Time   First MD Initiated Contact with Patient 08/06/20 2304     (approximate)  I have reviewed the triage vital signs and the nursing notes.   HISTORY  Chief Complaint Vomiting and Fever    HPI Regina Morris is a 40 y.o. female with diabetes who comes in with nausea, vomiting since yesterday.  Patient denies known COVID exposures.  She does not have a COVID vaccines.  Patient reports having some nausea and vomiting since yesterday.  She reports that it happened 1 times times.  She reports feeling hot but not taking temperatures.  She does report having a headache on the right temple with some associated pain with walking out of her bilateral eyes.  No vision changes.  Patient states that she is not been taking her diabetes medicine because she does not have it any longer.  Her headache is moderate, constant, worse with light.  She also does report a cough.  Patient states that she is currently menstruating.  She denies any abdominal pain.  Spanish interpreter used for history and any update          Past Medical History:  Diagnosis Date  . Gestational diabetes   . Kidney stone     Patient Active Problem List   Diagnosis Date Noted  . Acute pyelonephritis 12/12/2019  . Sepsis (HCC) 12/12/2019  . Hyperglycemia 12/12/2019  . Anemia, unspecified 12/12/2019  . Electrolyte disturbance 12/12/2019  . History of gestational diabetes mellitus, not pregnant 03/27/2012    History reviewed. No pertinent surgical history.  Prior to Admission medications   Medication Sig Start Date End Date Taking? Authorizing Provider  ciprofloxacin (CIPRO) 500 MG tablet TOMAR UNA TABLETA POR BOCA DOS VECES AL DIA POR 7 DIAS 12/14/19 12/13/20  Lynn Ito, MD  ferrous sulfate 325 (65 FE) MG tablet Take 1 tablet (325 mg total) by mouth daily with breakfast.  12/16/19 01/15/20  Lynn Ito, MD  metFORMIN (GLUCOPHAGE) 500 MG tablet Take 1 tablet (500 mg total) by mouth daily with breakfast. 12/15/19 01/14/20  Lynn Ito, MD    Allergies Penicillins  Family History  Problem Relation Age of Onset  . Uterine cancer Mother   . Diabetes Maternal Grandfather     Social History Social History   Tobacco Use  . Smoking status: Never Smoker  . Smokeless tobacco: Never Used  Vaping Use  . Vaping Use: Never used  Substance Use Topics  . Alcohol use: No  . Drug use: Never      Review of Systems Constitutional: Subjective feeling warm Eyes: No visual changes.  Sensitivity to light ENT: Sore throat Cardiovascular: Denies chest pain. Respiratory: Denies shortness of breath.  Positive cough Gastrointestinal: No abdominal pain.  Positive nausea and vomiting no diarrhea.  No constipation. Genitourinary: Negative for dysuria. Musculoskeletal: Negative for back pain. Skin: Negative for rash. Neurological: Positive headache, no focal weakness or numbness. All other ROS negative ____________________________________________   PHYSICAL EXAM:  VITAL SIGNS: ED Triage Vitals  Enc Vitals Group     BP 08/06/20 2044 (!) 146/82     Pulse Rate 08/06/20 2044 68     Resp 08/06/20 2044 20     Temp 08/06/20 2044 98.9 F (37.2 C)     Temp Source 08/06/20 2044 Oral     SpO2 08/06/20 2044 99 %     Weight 08/06/20 2049 165 lb (74.8 kg)  Height 08/06/20 2049 5\' 6"  (1.676 m)     Head Circumference --      Peak Flow --      Pain Score 08/06/20 2049 10     Pain Loc --      Pain Edu? --      Excl. in GC? --     Constitutional: Patient appears tired but is able to move and answers questions but occasionally has difficulty following my commands for examination. Eyes: Conjunctivae are normal. EOMI. pupils are reactive bilaterally Head: Atraumatic. Nose: No congestion/rhinnorhea. Mouth/Throat: Mucous membranes are moist.  OP appears clear Neck: No  stridor. Trachea Midline. FROM Cardiovascular: Normal rate, regular rhythm. Grossly normal heart sounds.  Good peripheral circulation. Respiratory: Normal respiratory effort.  No retractions. Lungs CTAB. Gastrointestinal: Soft and nontender. No distention. No abdominal bruits.  Musculoskeletal: No lower extremity tenderness nor edema.  No joint effusions. Neurologic: Patient has difficulty following commands but cranial nerves appear intact. Skin:  Skin is warm, dry and intact. No rash noted. Psychiatric: Unable to fully assess GU: Deferred   ____________________________________________   LABS (all labs ordered are listed, but only abnormal results are displayed)  Labs Reviewed  COMPREHENSIVE METABOLIC PANEL - Abnormal; Notable for the following components:      Result Value   CO2 20 (*)    Glucose, Bld 290 (*)    Calcium 8.6 (*)    All other components within normal limits  CBC - Abnormal; Notable for the following components:   WBC 20.5 (*)    RBC 5.66 (*)    Hemoglobin 9.3 (*)    HCT 34.2 (*)    MCV 60.4 (*)    MCH 16.4 (*)    MCHC 27.2 (*)    RDW 19.6 (*)    Platelets 715 (*)    All other components within normal limits  CBG MONITORING, ED - Abnormal; Notable for the following components:   Glucose-Capillary 260 (*)    All other components within normal limits  LIPASE, BLOOD  URINALYSIS, COMPLETE (UACMP) WITH MICROSCOPIC  POC URINE PREG, ED   ____________________________________________  RADIOLOGY Vela ProseI, Roque Schill E Riah Kehoe, personally viewed and evaluated these images (plain radiographs) as part of my medical decision making, as well as reviewing the written report by the radiologist.  ED MD interpretation: Intracranial stroke and hemorrhage  Official radiology report(s): CT ABDOMEN PELVIS WO CONTRAST  Result Date: 07/23/2020 CLINICAL DATA:  Abdominal distension EXAM: CT ABDOMEN AND PELVIS WITHOUT CONTRAST TECHNIQUE: Multidetector CT imaging of the abdomen and pelvis was  performed following the standard protocol without IV contrast. COMPARISON:  None. FINDINGS: Lower chest: The visualized lung bases are clear bilaterally. The visualized heart and pericardium are unremarkable. Hepatobiliary: No focal liver abnormality is seen. No gallstones, gallbladder wall thickening, or biliary dilatation. Pancreas: Unremarkable Spleen: Unremarkable a Adrenals/Urinary Tract: Adrenal glands are unremarkable. Kidneys are normal, without renal calculi, focal lesion, or hydronephrosis. Bladder is unremarkable. Stomach/Bowel: Stomach is within normal limits. Appendix appears normal. No evidence of bowel wall thickening, distention, or inflammatory changes. Trace free fluid within the pelvis is nonspecific, possibly physiologic in a female patient of this age. No free air. Vascular/Lymphatic: No significant vascular findings are present. No enlarged abdominal or pelvic lymph nodes. Reproductive: Uterus and bilateral adnexa are unremarkable. Other: No abdominal wall hernia.  Rectum unremarkable. Musculoskeletal: No acute bone abnormality. No lytic or blastic bone lesion identified. IMPRESSION: No acute intra-abdominal pathology identified. Electronically Signed   By: Helyn NumbersAshesh  Parikh MD  On: 07/29/2020 02:45   DG Chest 2 View  Result Date: 07/23/2020 CLINICAL DATA:  Cough EXAM: CHEST - 2 VIEW COMPARISON:  12/12/2019 FINDINGS: Lungs volumes are small, but are symmetric and are clear save for mild left basilar atelectasis. No pneumothorax or pleural effusion. Cardiac size within normal limits. Pulmonary vascularity is normal. Osseous structures are age-appropriate. No acute bone abnormality. IMPRESSION: No active cardiopulmonary disease. Electronically Signed   By: Helyn Numbers MD   On: 07/18/2020 00:39   CT Head Wo Contrast  Result Date: 07/13/2020 CLINICAL DATA:  Headache, nausea, vomiting EXAM: CT HEAD WITHOUT CONTRAST TECHNIQUE: Contiguous axial images were obtained from the base of the skull  through the vertex without intravenous contrast. COMPARISON:  None. FINDINGS: Brain: There is a Morris area of cytotoxic edema involving the posterior temporal cortex most in keeping with a posterior MCA territory infarct. There is extensive subarachnoid hemorrhage within the area of cortical infarction. Mass effect is noted with effacement of the a overlying sulci. Tiny subdural hematoma noted lateral to the frontal lobe in the region of the sylvian fissure. No midline shift. Ventricular size is normal. Cerebellum is unremarkable. Vascular: No asymmetric hyperdense vasculature at the skull base. Skull: Intact Sinuses/Orbits: The visualized orbits are unremarkable. The visualized paranasal sinuses are clear. Other: The mastoid air cells and middle ear cavities are clear. IMPRESSION: Morris posterior right MCA territory infarct involving the posterior right temporal cortex with superimposed moderate subarachnoid hemorrhage interdigitating within the sulci of the infarcted region and tiny subdural hematoma. Mild mass effect. No midline shift. These results were called by telephone at the time of interpretation on 08/02/2020 at 2:37 am to provider [facility] , who verbally acknowledged these results. Electronically Signed   By: Helyn Numbers MD   On: 07/30/2020 02:41    ____________________________________________   PROCEDURES  Procedure(s) performed (including Critical Care):  .1-3 Lead EKG Interpretation Performed by: Concha Se, MD Authorized by: Concha Se, MD     Interpretation: normal     ECG rate:  60s    ECG rate assessment: normal     Rhythm: sinus rhythm     Ectopy: none     Conduction: normal   .Critical Care Performed by: Concha Se, MD Authorized by: Concha Se, MD   Critical care provider statement:    Critical care time (minutes):  45   Critical care was necessary to treat or prevent imminent or life-threatening deterioration of the following conditions:  CNS failure  or compromise   Critical care was time spent personally by me on the following activities:  Discussions with consultants, evaluation of patient's response to treatment, examination of patient, ordering and performing treatments and interventions, ordering and review of laboratory studies, ordering and review of radiographic studies, pulse oximetry, re-evaluation of patient's condition, obtaining history from patient or surrogate and review of old charts     ____________________________________________   INITIAL IMPRESSION / ASSESSMENT AND PLAN / ED COURSE  Regina Morris was evaluated in Emergency Department on 08/06/2020 for the symptoms described in the history of present illness. She was evaluated in the context of the global COVID-19 pandemic, which necessitated consideration that the patient might be at risk for infection with the SARS-CoV-2 virus that causes COVID-19. Institutional protocols and algorithms that pertain to the evaluation of patients at risk for COVID-19 are in a state of rapid change based on information released by regulatory bodies including the CDC and federal and state organizations.  These policies and algorithms were followed during the patient's care in the ED.     Patient is a 40 year old who comes in with nausea, vomiting.  Patient perseverates on a headache on the right side of her head.  She has difficulty opening her eyes secondary to the pain with a light and therefore resistant to following my finger to check extraocular movements.  However when I encouraged her to try to keep her eyes open she is willing to do it and is able to track throughout.  Patient reports having fevers but is afebrile here and denies any fever modulators.  She does report being on some kind of antibiotic from [place] so this could complicate things.  Labs ordered to evaluate for Electra abnormalities, AKI, UTI, pneumonia.  Given headaches with nausea and vomiting will get CT head to  evaluate for intercranial hemorrhage, mass and will get CT abdomen just given the continuous vomiting with elevated white count.  Patient CT scan concerning for stroke with hemorrhagic conversion.  Will consult neurology and neurosurgery  Neurology Dr. Imogene Burn recommends MRI, MRA, repeat CT head in 24 hours, consulting neurosurgery and frequent neurochecks, echo.  Recommend admission to ICU  Discussed with Dr. Myer Haff from neurosurgery who is okay with patient be admitted here.  Given patient's requiring frequent neuro checks I did discuss with ICU  We discussed with neurosurgery who now recommends CT head in 6 to 12 hours, coags, 1 g Keppra at 500 twice daily after  D/w Dr. Derry Lory who recommends, Mri tansfer to [facility]  MRI brain was done and I was told the MRIs were completed but unfortunately MRIs were not done.  I did let the neurology team know from [facility].  Patient was transferred to [facility]          ____________________________________________   FINAL CLINICAL IMPRESSION(S) / ED DIAGNOSES   Final diagnoses:  Cerebrovascular accident (CVA), unspecified mechanism (HCC)  Hemorrhage      MEDICATIONS GIVEN DURING THIS VISIT:  Medications  ondansetron (ZOFRAN-ODT) disintegrating tablet 4 mg (4 mg Oral Given 08/06/20 2058)     ED Discharge Orders    None       Note:  This document was prepared using Dragon voice recognition software and may include unintentional dictation errors.   Concha Se, MD 07/29/2020 1025    Concha Se, MD 07/17/2020 [phone]

## 2020-08-06 NOTE — ED Notes (Signed)
Patient transported to X-ray 

## 2020-08-06 NOTE — ED Triage Notes (Signed)
Pt to ED from home c/o n/v since yesterday 10 or more times, headache, sweating, fever. Pt denies known COVID exposure, hx of diabetes and last checked blood sugar "a long time ago".  Pt pale and diaphoretic in triage, chest rise even and unlabored.

## 2020-08-07 ENCOUNTER — Inpatient Hospital Stay (HOSPITAL_COMMUNITY): Payer: Self-pay

## 2020-08-07 ENCOUNTER — Encounter (HOSPITAL_COMMUNITY): Payer: Self-pay | Admitting: Neurology

## 2020-08-07 ENCOUNTER — Inpatient Hospital Stay (HOSPITAL_COMMUNITY)
Admission: EM | Admit: 2020-08-07 | Discharge: 2020-09-12 | DRG: 023 | Disposition: E | Payer: Self-pay | Attending: Pulmonary Disease | Admitting: Pulmonary Disease

## 2020-08-07 ENCOUNTER — Other Ambulatory Visit: Payer: Self-pay

## 2020-08-07 ENCOUNTER — Emergency Department: Payer: Self-pay

## 2020-08-07 ENCOUNTER — Other Ambulatory Visit (HOSPITAL_COMMUNITY): Payer: Self-pay

## 2020-08-07 DIAGNOSIS — I63429 Cerebral infarction due to embolism of unspecified anterior cerebral artery: Secondary | ICD-10-CM

## 2020-08-07 DIAGNOSIS — D75839 Thrombocytosis, unspecified: Secondary | ICD-10-CM | POA: Diagnosis not present

## 2020-08-07 DIAGNOSIS — Z452 Encounter for adjustment and management of vascular access device: Secondary | ICD-10-CM

## 2020-08-07 DIAGNOSIS — G8194 Hemiplegia, unspecified affecting left nondominant side: Secondary | ICD-10-CM | POA: Diagnosis present

## 2020-08-07 DIAGNOSIS — Z880 Allergy status to penicillin: Secondary | ICD-10-CM

## 2020-08-07 DIAGNOSIS — G9382 Brain death: Secondary | ICD-10-CM | POA: Diagnosis not present

## 2020-08-07 DIAGNOSIS — I609 Nontraumatic subarachnoid hemorrhage, unspecified: Secondary | ICD-10-CM | POA: Diagnosis present

## 2020-08-07 DIAGNOSIS — G936 Cerebral edema: Secondary | ICD-10-CM | POA: Diagnosis present

## 2020-08-07 DIAGNOSIS — Z8632 Personal history of gestational diabetes: Secondary | ICD-10-CM

## 2020-08-07 DIAGNOSIS — E870 Hyperosmolality and hypernatremia: Secondary | ICD-10-CM | POA: Diagnosis not present

## 2020-08-07 DIAGNOSIS — R29707 NIHSS score 7: Secondary | ICD-10-CM | POA: Diagnosis present

## 2020-08-07 DIAGNOSIS — R578 Other shock: Secondary | ICD-10-CM | POA: Diagnosis not present

## 2020-08-07 DIAGNOSIS — Z8049 Family history of malignant neoplasm of other genital organs: Secondary | ICD-10-CM

## 2020-08-07 DIAGNOSIS — E876 Hypokalemia: Secondary | ICD-10-CM | POA: Diagnosis not present

## 2020-08-07 DIAGNOSIS — R1312 Dysphagia, oropharyngeal phase: Secondary | ICD-10-CM | POA: Diagnosis present

## 2020-08-07 DIAGNOSIS — R471 Dysarthria and anarthria: Secondary | ICD-10-CM | POA: Diagnosis present

## 2020-08-07 DIAGNOSIS — D72829 Elevated white blood cell count, unspecified: Secondary | ICD-10-CM

## 2020-08-07 DIAGNOSIS — I63511 Cerebral infarction due to unspecified occlusion or stenosis of right middle cerebral artery: Secondary | ICD-10-CM

## 2020-08-07 DIAGNOSIS — G935 Compression of brain: Secondary | ICD-10-CM | POA: Diagnosis present

## 2020-08-07 DIAGNOSIS — E785 Hyperlipidemia, unspecified: Secondary | ICD-10-CM | POA: Diagnosis present

## 2020-08-07 DIAGNOSIS — I6200 Nontraumatic subdural hemorrhage, unspecified: Secondary | ICD-10-CM | POA: Diagnosis present

## 2020-08-07 DIAGNOSIS — Z0189 Encounter for other specified special examinations: Secondary | ICD-10-CM

## 2020-08-07 DIAGNOSIS — G08 Intracranial and intraspinal phlebitis and thrombophlebitis: Secondary | ICD-10-CM | POA: Diagnosis not present

## 2020-08-07 DIAGNOSIS — I63411 Cerebral infarction due to embolism of right middle cerebral artery: Principal | ICD-10-CM | POA: Diagnosis present

## 2020-08-07 DIAGNOSIS — J9601 Acute respiratory failure with hypoxia: Secondary | ICD-10-CM | POA: Diagnosis not present

## 2020-08-07 DIAGNOSIS — I10 Essential (primary) hypertension: Secondary | ICD-10-CM | POA: Diagnosis present

## 2020-08-07 DIAGNOSIS — E1165 Type 2 diabetes mellitus with hyperglycemia: Secondary | ICD-10-CM | POA: Diagnosis present

## 2020-08-07 DIAGNOSIS — R509 Fever, unspecified: Secondary | ICD-10-CM

## 2020-08-07 DIAGNOSIS — I639 Cerebral infarction, unspecified: Secondary | ICD-10-CM

## 2020-08-07 DIAGNOSIS — R233 Spontaneous ecchymoses: Secondary | ICD-10-CM

## 2020-08-07 DIAGNOSIS — Z529 Donor of unspecified organ or tissue: Secondary | ICD-10-CM

## 2020-08-07 DIAGNOSIS — Z4659 Encounter for fitting and adjustment of other gastrointestinal appliance and device: Secondary | ICD-10-CM

## 2020-08-07 DIAGNOSIS — IMO0001 Reserved for inherently not codable concepts without codable children: Secondary | ICD-10-CM

## 2020-08-07 DIAGNOSIS — R339 Retention of urine, unspecified: Secondary | ICD-10-CM | POA: Diagnosis present

## 2020-08-07 DIAGNOSIS — D62 Acute posthemorrhagic anemia: Secondary | ICD-10-CM | POA: Diagnosis not present

## 2020-08-07 DIAGNOSIS — Z833 Family history of diabetes mellitus: Secondary | ICD-10-CM

## 2020-08-07 LAB — CBG MONITORING, ED: Glucose-Capillary: 228 mg/dL — ABNORMAL HIGH (ref 70–99)

## 2020-08-07 LAB — LIPID PANEL
Cholesterol: 276 mg/dL — ABNORMAL HIGH (ref 0–200)
HDL: 45 mg/dL (ref >40)
LDL Cholesterol: 197 mg/dL — ABNORMAL HIGH (ref 0–99)
Total CHOL/HDL Ratio: 6.1 RATIO
Triglycerides: 168 mg/dL — ABNORMAL HIGH (ref <150)
VLDL: 34 mg/dL (ref 0–40)

## 2020-08-07 LAB — SEDIMENTATION RATE: Sed Rate: 26 mm/hr — ABNORMAL HIGH (ref 0–22)

## 2020-08-07 LAB — RAPID HIV SCREEN (HIV 1/2 AB+AG)
HIV 1/2 Antibodies: NONREACTIVE
HIV-1 P24 Antigen - HIV24: NONREACTIVE

## 2020-08-07 LAB — RESP PANEL BY RT-PCR (FLU A&B, COVID) ARPGX2
Influenza A by PCR: NEGATIVE
Influenza B by PCR: NEGATIVE
SARS Coronavirus 2 by RT PCR: NEGATIVE

## 2020-08-07 LAB — HEMOGLOBIN A1C
Hgb A1c MFr Bld: 12.9 % — ABNORMAL HIGH (ref 4.8–5.6)
Mean Plasma Glucose: 324 mg/dL

## 2020-08-07 LAB — RAPID URINE DRUG SCREEN, HOSP PERFORMED
Amphetamines: NOT DETECTED
Barbiturates: NOT DETECTED
Benzodiazepines: NOT DETECTED
Cocaine: NOT DETECTED
Opiates: NOT DETECTED
Tetrahydrocannabinol: NOT DETECTED

## 2020-08-07 LAB — PROCALCITONIN: Procalcitonin: <0.1 ng/mL

## 2020-08-07 LAB — ETHANOL: Alcohol, Ethyl (B): <10 mg/dL (ref <10)

## 2020-08-07 LAB — GLUCOSE, CAPILLARY
Glucose-Capillary: 238 mg/dL — ABNORMAL HIGH (ref 70–99)
Glucose-Capillary: 236 mg/dL — ABNORMAL HIGH (ref 70–99)
Glucose-Capillary: 180 mg/dL — ABNORMAL HIGH (ref 70–99)

## 2020-08-07 LAB — ECHOCARDIOGRAM COMPLETE BUBBLE STUDY
AR max vel: 2.58 cm2
AV Area VTI: 2.74 cm2
AV Area mean vel: 2.4 cm2
AV Mean grad: 4 mmHg
AV Peak grad: 6.9 mmHg
Ao pk vel: 1.31 m/s
Area-P 1/2: 4.68 cm2
S' Lateral: 3.3 cm

## 2020-08-07 LAB — SODIUM
Sodium: 136 mmol/L (ref 135–145)
Sodium: 140 mmol/L (ref 135–145)

## 2020-08-07 LAB — ANTITHROMBIN III: AntiThromb III Func: 125 % — ABNORMAL HIGH (ref 75–120)

## 2020-08-07 LAB — APTT: aPTT: 22 seconds — ABNORMAL LOW (ref 24–36)

## 2020-08-07 LAB — PROTIME-INR
INR: 1.1 (ref 0.8–1.2)
Prothrombin Time: 13.9 seconds (ref 11.4–15.2)

## 2020-08-07 LAB — MRSA PCR SCREENING: MRSA by PCR: NEGATIVE

## 2020-08-07 LAB — GROUP A STREP BY PCR: Group A Strep by PCR: NOT DETECTED

## 2020-08-07 LAB — HCG, QUANTITATIVE, PREGNANCY: hCG, Beta Chain, Quant, S: 1 m[IU]/mL (ref <5)

## 2020-08-07 IMAGING — CT CT ABD-PELV W/O CM
2 of 4 series · 16 of 46 positions shown, 18 images · non-contrast
Comparison: None.

CLINICAL DATA: Abdominal distension

EXAM:
CT ABDOMEN AND PELVIS WITHOUT CONTRAST
TECHNIQUE: Multidetector CT imaging of the abdomen and pelvis was performed
following the standard protocol without IV contrast.

[Series 2: axial st · axial · 0.84mm/px · z∈[-471,-6]mm · 13 of 103 slices shown, 15 images]
[im 5/103  soft-tissue]
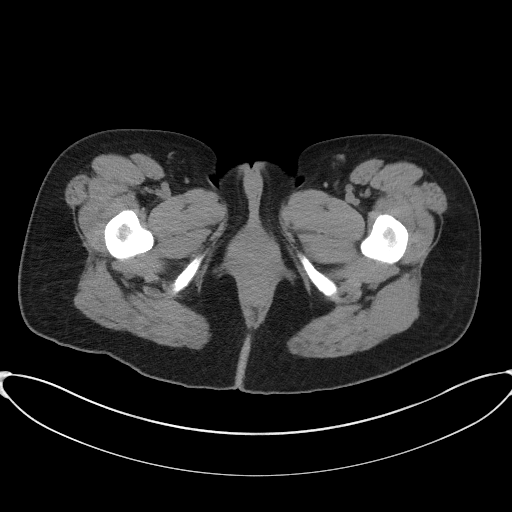
[im 5/103  bone]
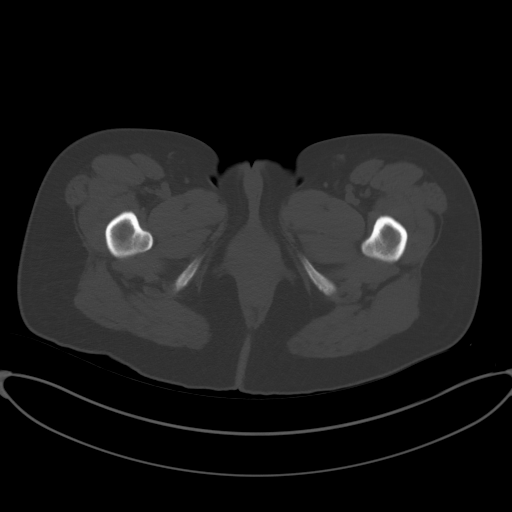
[im 14/103  soft-tissue]
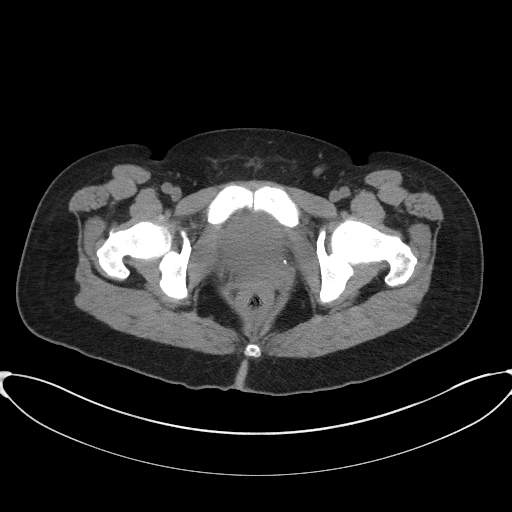
[im 24/103  soft-tissue]
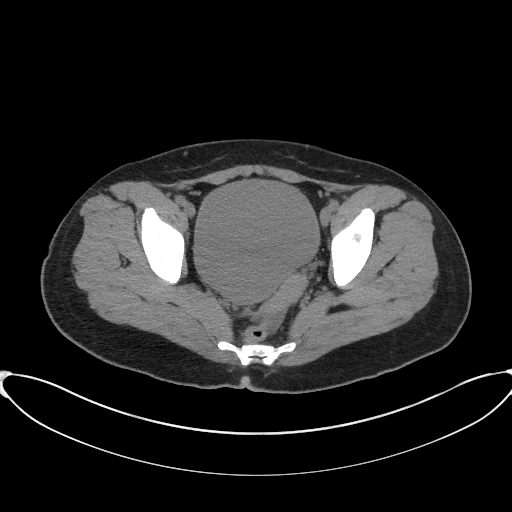
[im 28/103  soft-tissue]
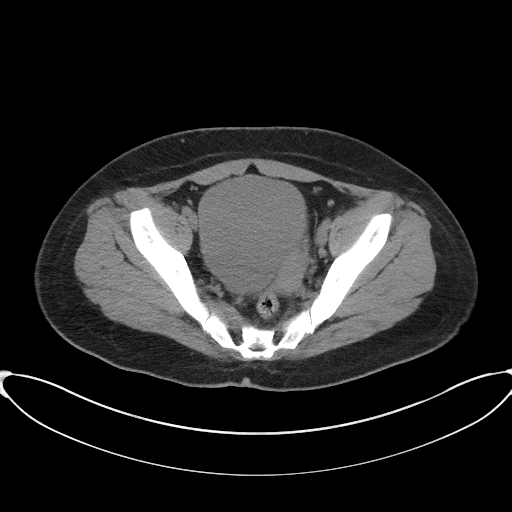
[im 38/103  soft-tissue]
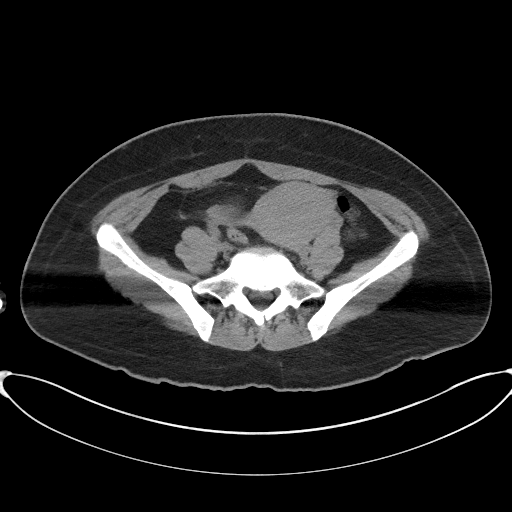
[im 42/103  soft-tissue]
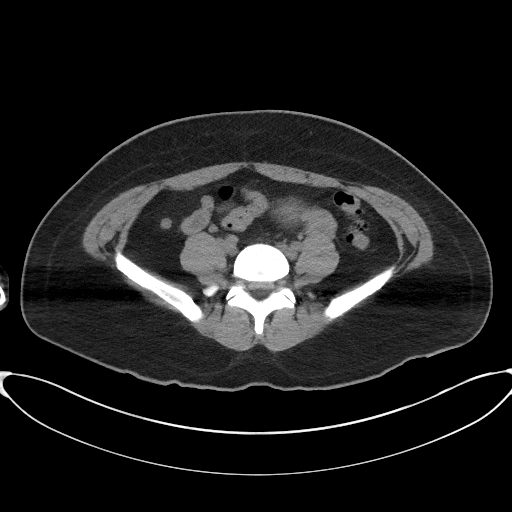
[im 52/103  soft-tissue]
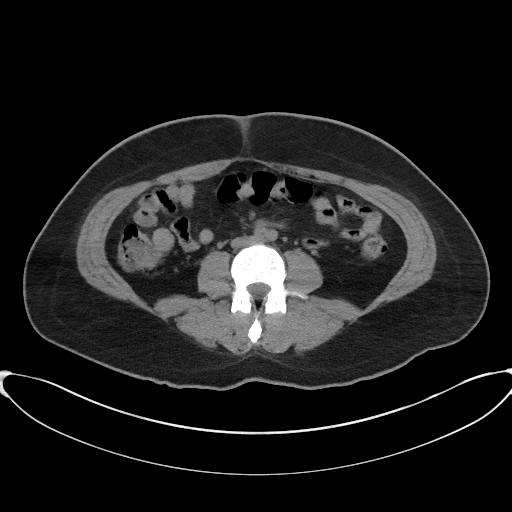
[im 61/103  soft-tissue]
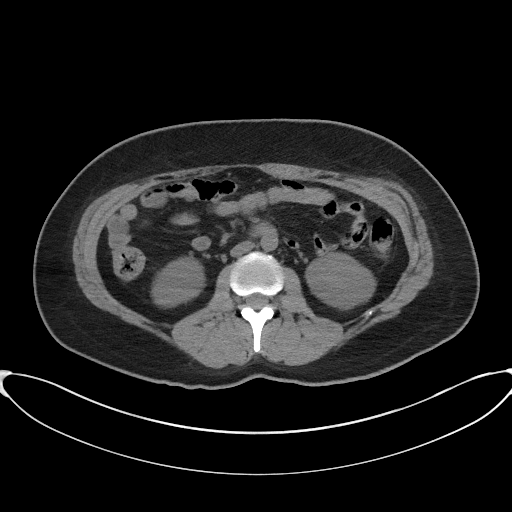
[im 65/103  soft-tissue]
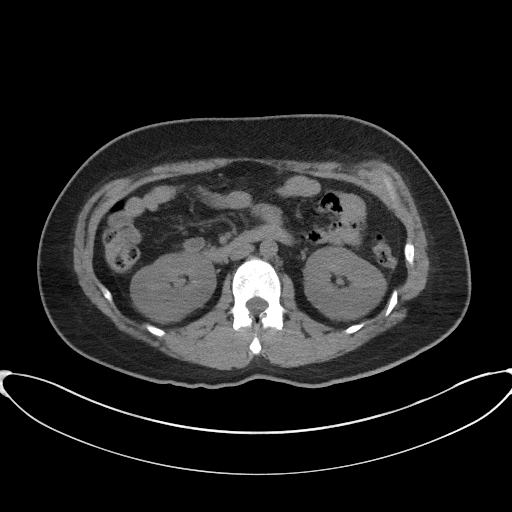
[im 65/103  bone]
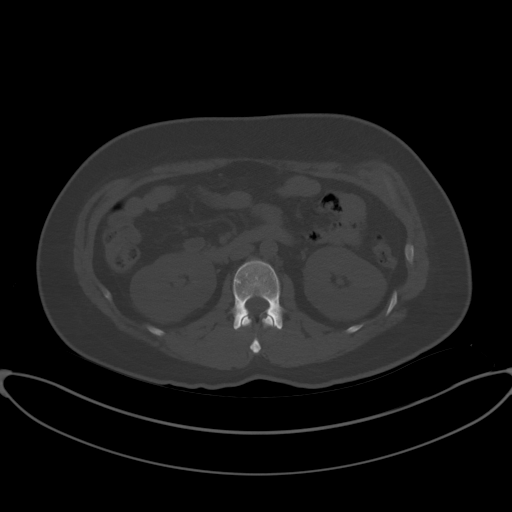
[im 75/103  soft-tissue]
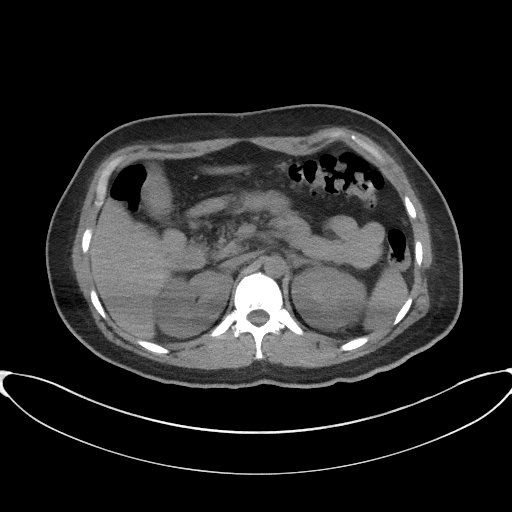
[im 79/103  soft-tissue]
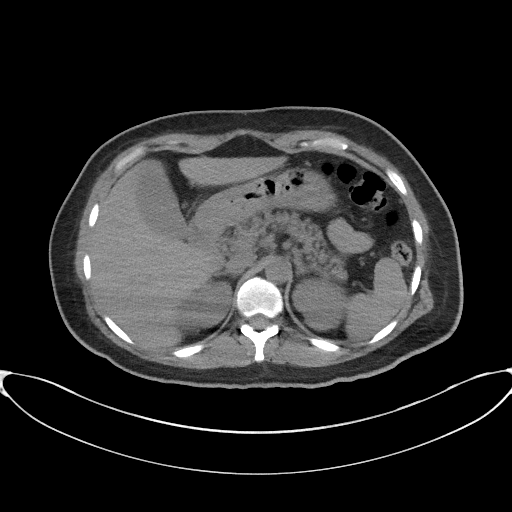
[im 89/103  soft-tissue]
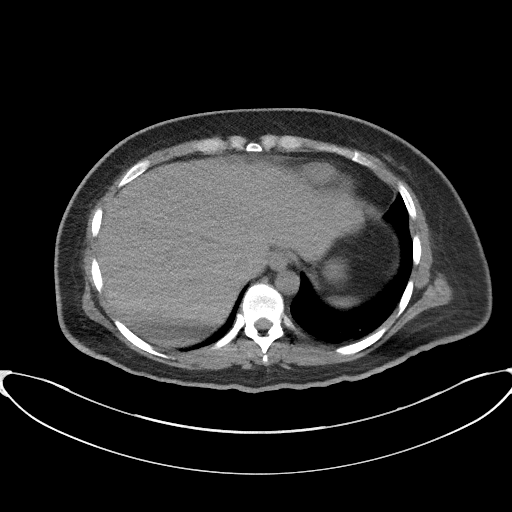
[im 98/103  soft-tissue]
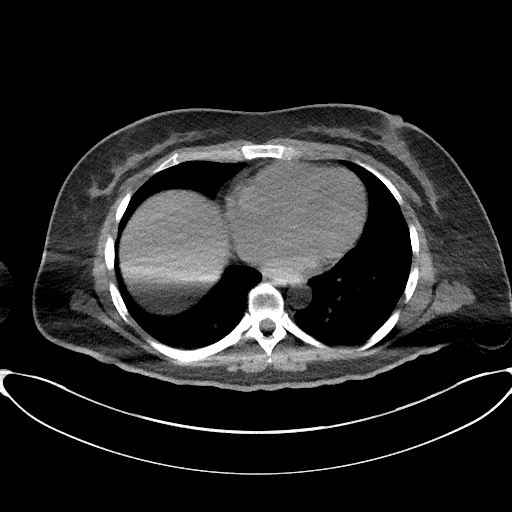

[Series 5: coronal st · coronal · 0.77mm/px · 3 of 84 slices shown]
[im 28/84  soft-tissue]
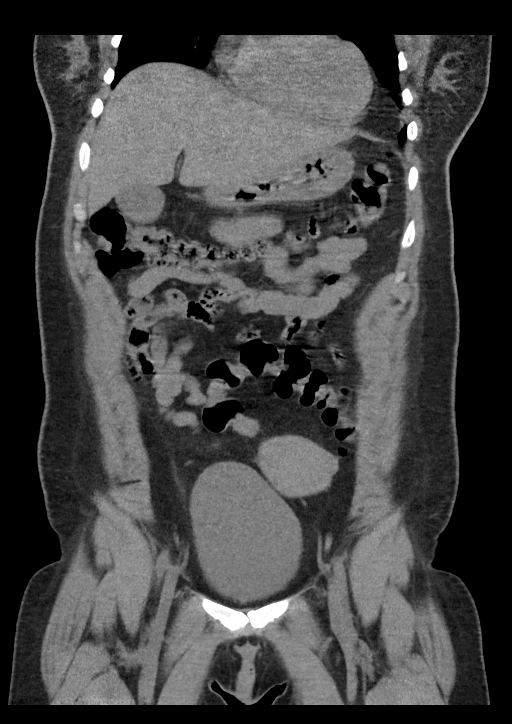
[im 37/84  soft-tissue]
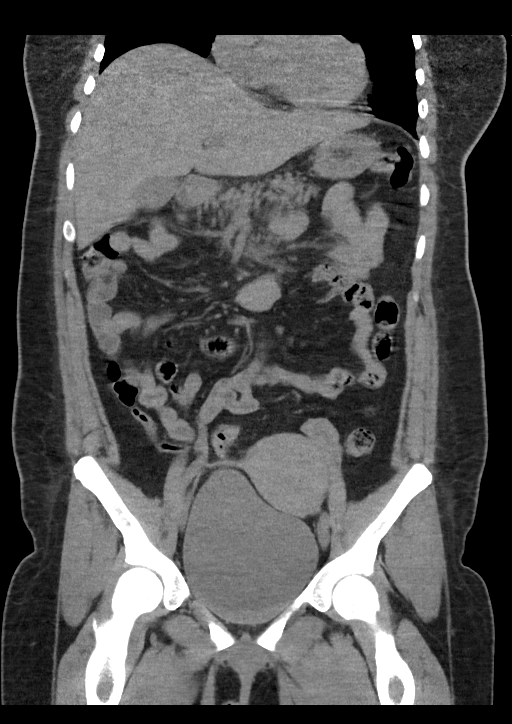
[im 47/84  soft-tissue]
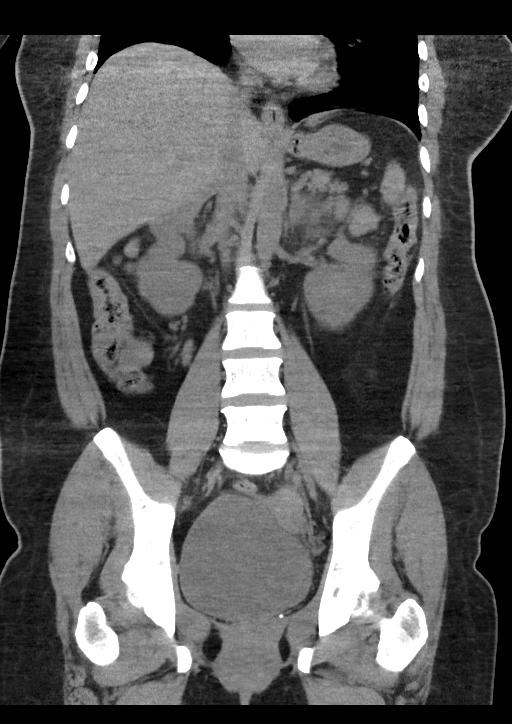

[16 of 46 positions shown; findings below may reference images not displayed]

FINDINGS: Lower chest: The visualized lung bases are clear bilaterally. The
visualized heart and pericardium are unremarkable.

Hepatobiliary: No focal liver abnormality is seen. No gallstones,
gallbladder wall thickening, or biliary dilatation.

Pancreas: Unremarkable

Spleen: Unremarkable a

Adrenals/Urinary Tract: Adrenal glands are unremarkable. Kidneys are
normal, without renal calculi, focal lesion, or hydronephrosis.
Bladder is unremarkable.

Stomach/Bowel: Stomach is within normal limits. Appendix appears
normal. No evidence of bowel wall thickening, distention, or
inflammatory changes. Trace free fluid within the pelvis is
nonspecific, possibly physiologic in a female patient of this age.
No free air.

Vascular/Lymphatic: No significant vascular findings are present. No
enlarged abdominal or pelvic lymph nodes.

Reproductive: Uterus and bilateral adnexa are unremarkable.

Other: No abdominal wall hernia.  Rectum unremarkable.

Musculoskeletal: No acute bone abnormality. No lytic or blastic bone
lesion identified.
IMPRESSION: No acute intra-abdominal pathology identified.

## 2020-08-07 IMAGING — MR MR HEAD W/O CM
5 of 6 series · 25 of 48 positions shown · non-contrast
Comparison: Head CT from earlier today

CLINICAL DATA: Acute stroke suspected

EXAM:
MRI HEAD WITHOUT CONTRAST
TECHNIQUE: Multiplanar, multiecho pulse sequences of the brain and surrounding
structures were attempted without intravenous contrast.

[Series 2: ax dwi_tracew · axial · 3.0mm · 1.31mm/px · z∈[-109,+47]mm · 11 of 96 slices shown]
[im 1/96]
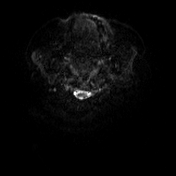
[im 10/96]
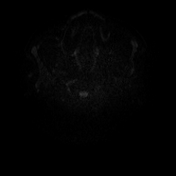
[im 20/96]
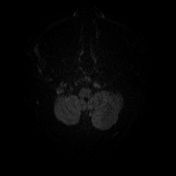
[im 29/96]
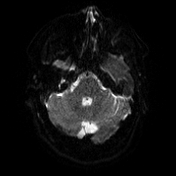
[im 39/96]
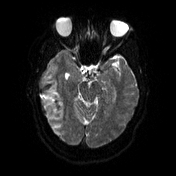
[im 48/96]
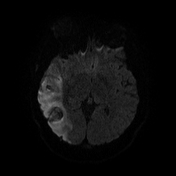
[im 58/96]
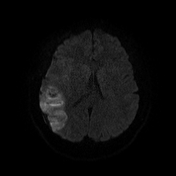
[im 67/96]
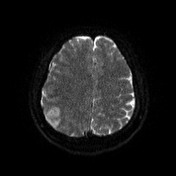
[im 77/96]
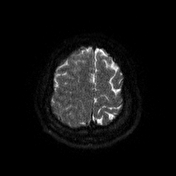
[im 86/96]
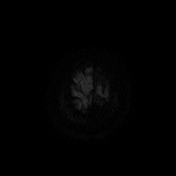
[im 96/96]
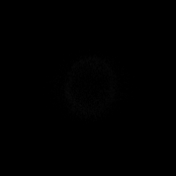

[Series 3: ax dwi_adc · axial · 3.0mm · 1.31mm/px · z∈[-109,+47]mm · 5 of 48 slices shown]
[im 1/48]
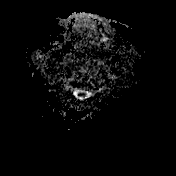
[im 12/48]
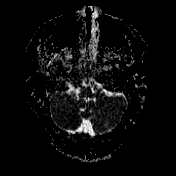
[im 24/48]
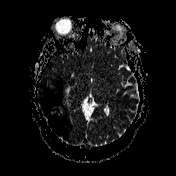
[im 36/48]
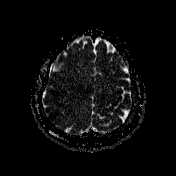
[im 48/48]
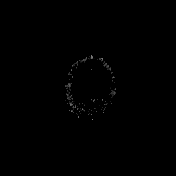

[Series 4: cor dwi_tracew · coronal · 5.0mm · 1.31mm/px · 4 of 38 slices shown]
[im 1/38]
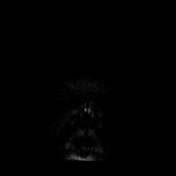
[im 13/38]
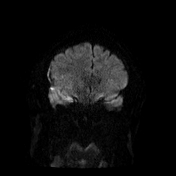
[im 25/38]
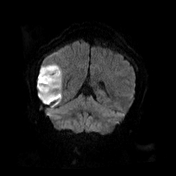
[im 38/38]
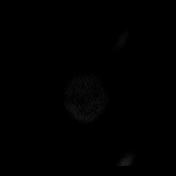

[Series 5: cor dwi_adc · coronal · 5.0mm · 1.31mm/px · 4 of 38 slices shown]
[im 1/38]
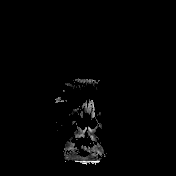
[im 13/38]
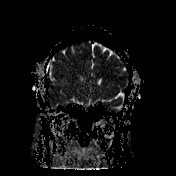
[im 25/38]
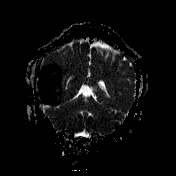
[im 38/38]
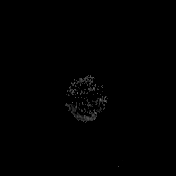

[Series 1041: h-f · axial · 0.4mm · 0.41mm/px · 1 of 1 slices shown]
[im 1/1]
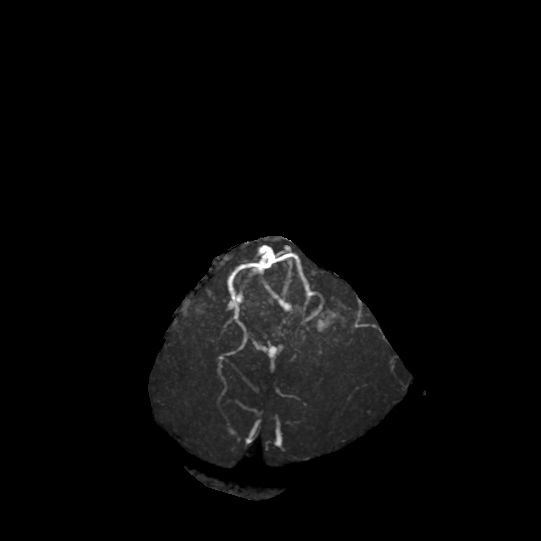

[25 of 48 positions shown; findings below may reference images not displayed]

FINDINGS: Only diffusion imaging could be acquired before the patient removed
the head coil and terminated the exam.

Restricted diffusion in the inferior division right MCA territory
with petechial hemorrhage and swelling causing 4 mm of midline shift
to the left. No detected change from prior CT.
IMPRESSION: 1. Acute infarct involving the inferior division right MCA territory
with petechial hemorrhage and 4 mm midline shift.
2. Diffusion only study due to patient condition.

## 2020-08-07 IMAGING — CR DG CHEST 2V
2 series · 2 of 2 positions shown · non-contrast
Comparison: [DATE]

CLINICAL DATA: Cough

EXAM:
CHEST - 2 VIEW

[chest lat]
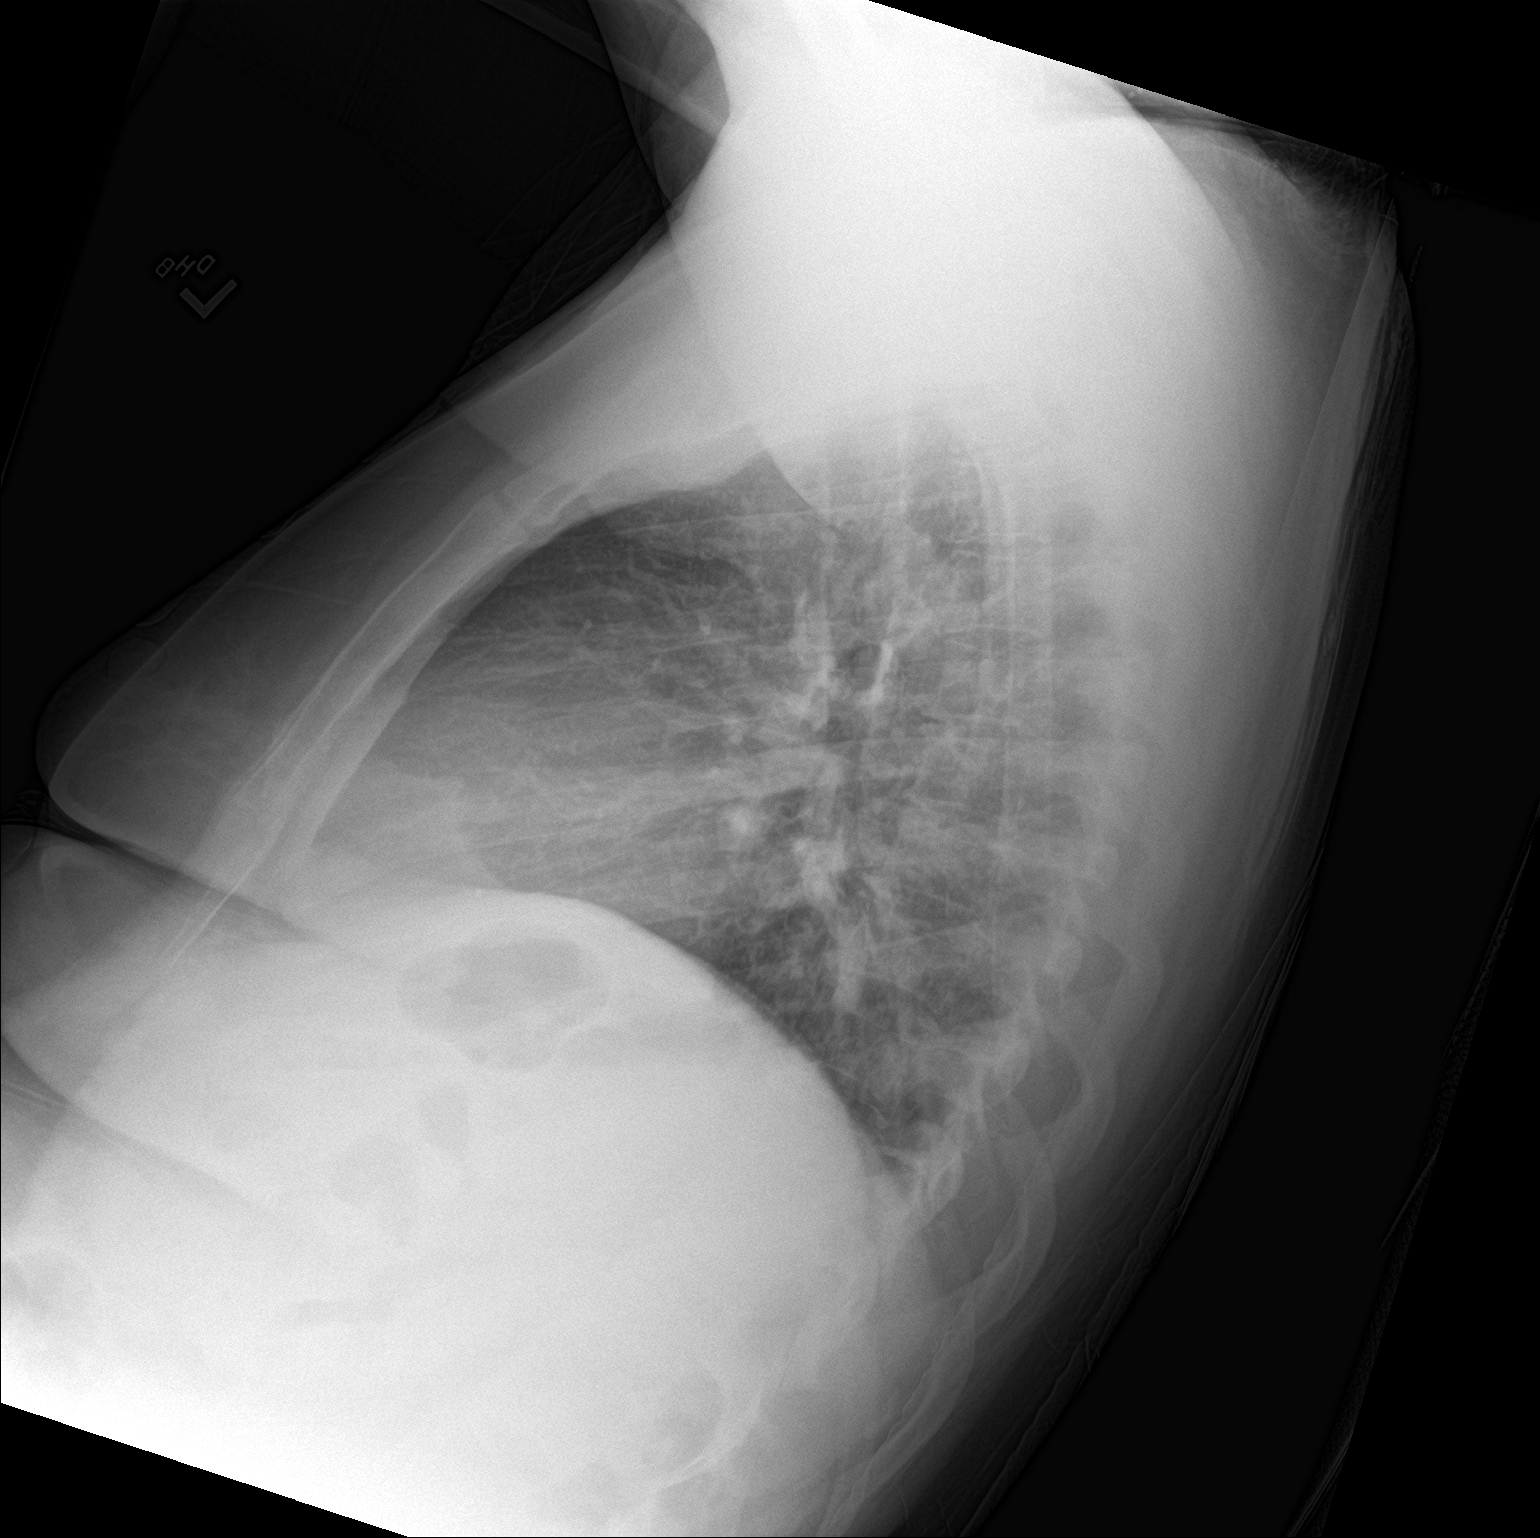

[chest ap]
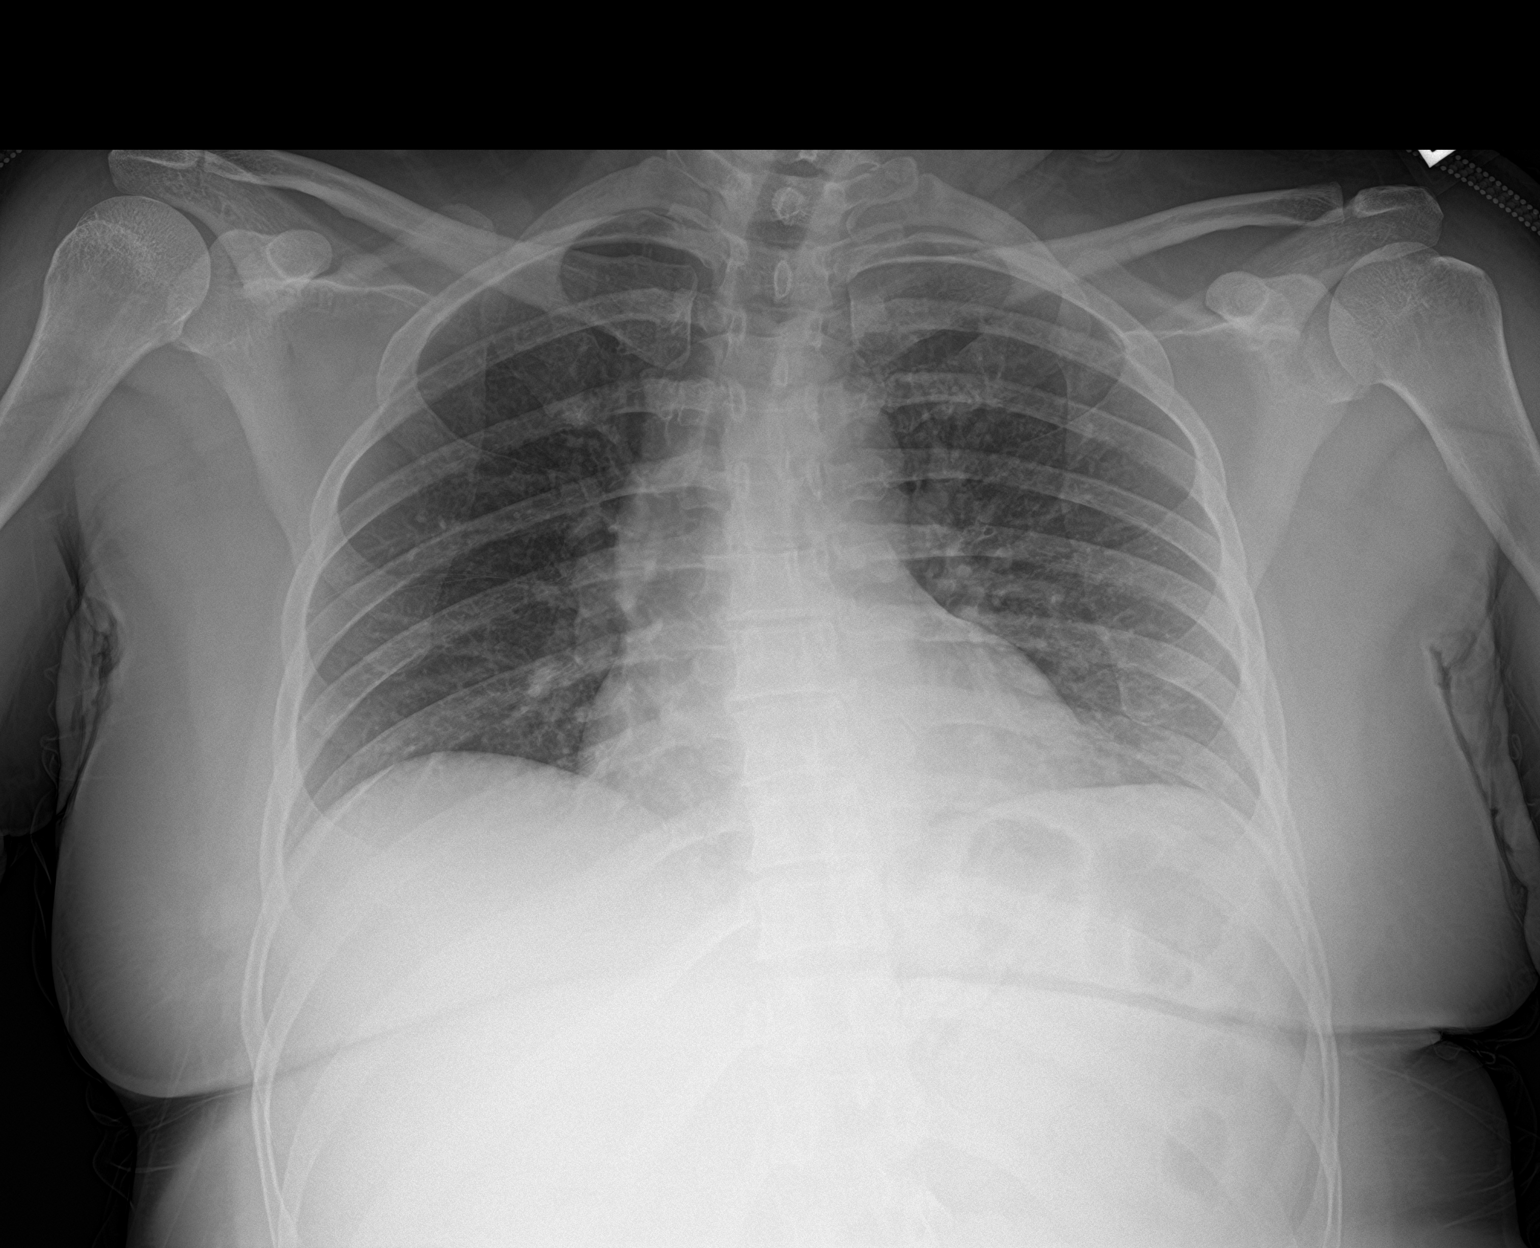

[2 of 2 positions shown; findings below may reference images not displayed]

FINDINGS: Lungs volumes are small, but are symmetric and are clear save for
mild left basilar atelectasis. No pneumothorax or pleural effusion.
Cardiac size within normal limits. Pulmonary vascularity is normal.
Osseous structures are age-appropriate. No acute bone abnormality.
IMPRESSION: No active cardiopulmonary disease.

## 2020-08-07 IMAGING — CT CT HEAD W/O CM
4 series · 16 of 47 positions shown, 18 images · non-contrast
Comparison: Head CT [DATE] at [DATE] a.m.

CLINICAL DATA: Stroke follow-up.

EXAM:
CT HEAD WITHOUT CONTRAST
TECHNIQUE: Contiguous axial images were obtained from the base of the skull
through the vertex without intravenous contrast.

[Series 3: head without ax · axial · non-contrast · 0.36mm/px · z∈[-257,-138]mm · 7 of 34 slices shown, 9 images]
[im 5/34  brain]
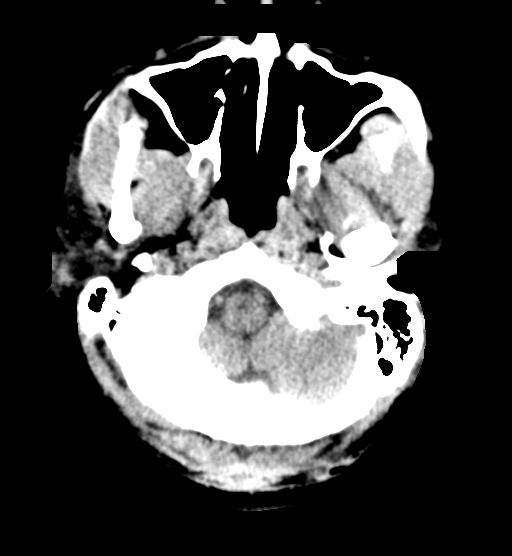
[im 5/34  bone]
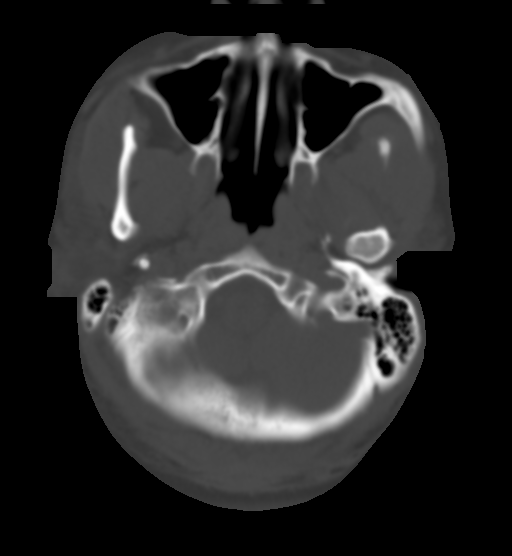
[im 9/34  brain]
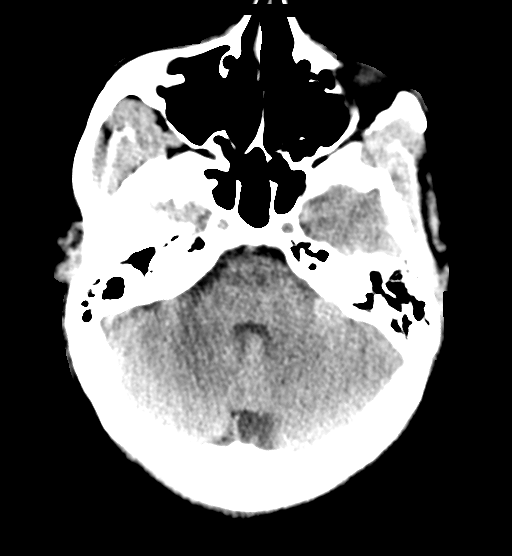
[im 13/34  brain]
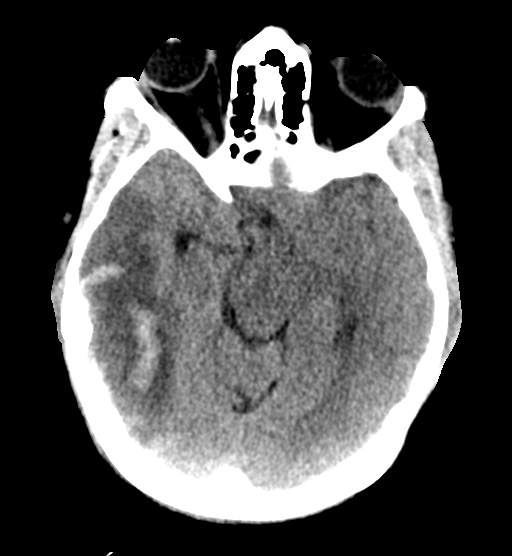
[im 17/34  brain]
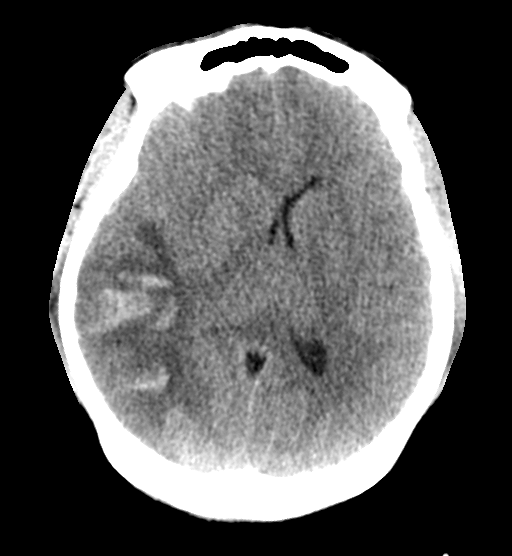
[im 21/34  brain]
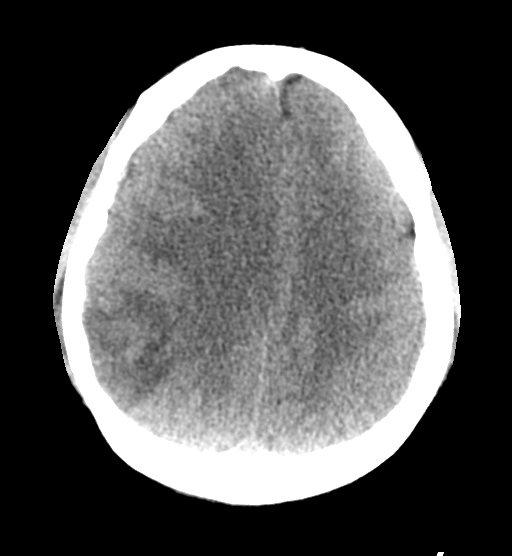
[im 21/34  bone]
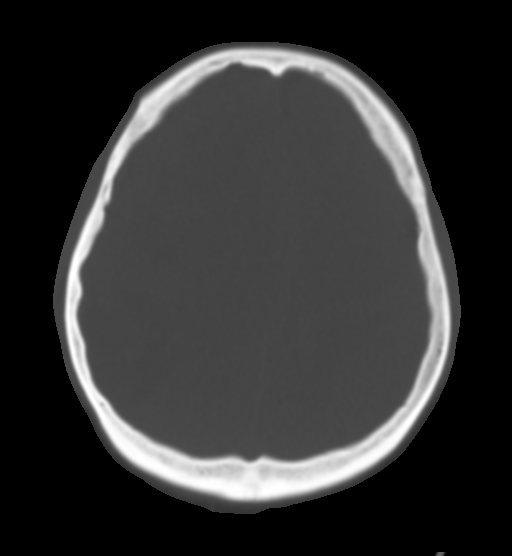
[im 25/34  brain]
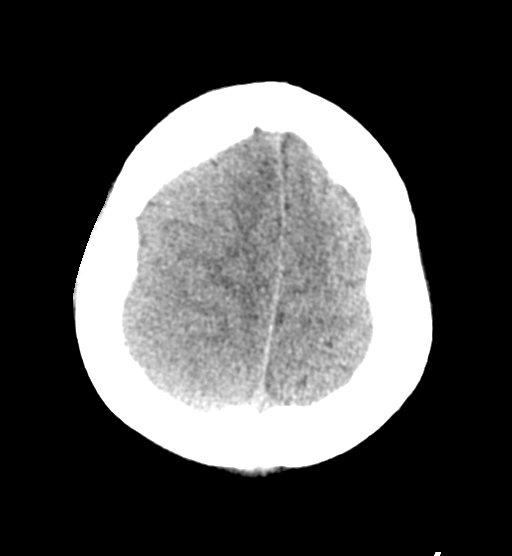
[im 29/34  brain]
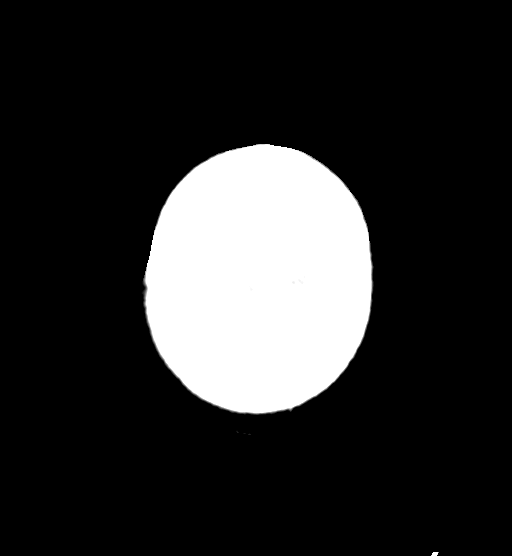

[Series 4: head without cor · coronal · non-contrast · 0.33mm/px · 3 of 69 slices shown]
[im 23/69  brain]
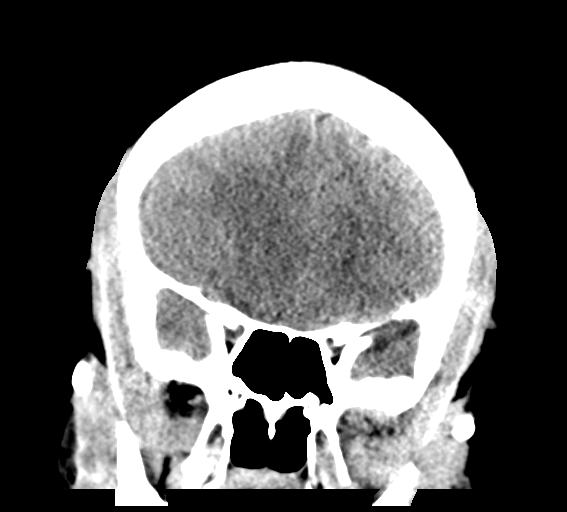
[im 31/69  brain]
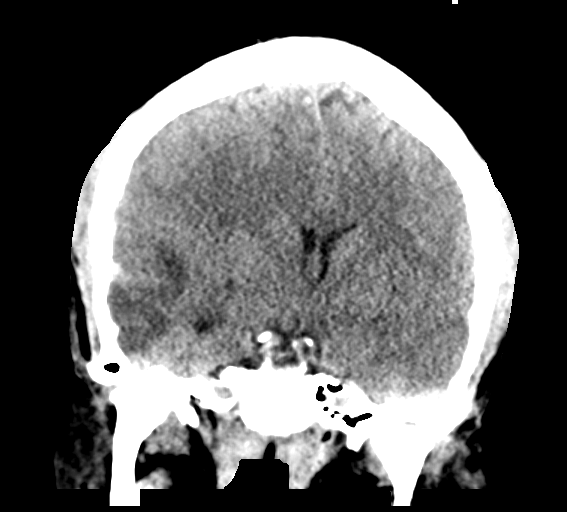
[im 38/69  brain]
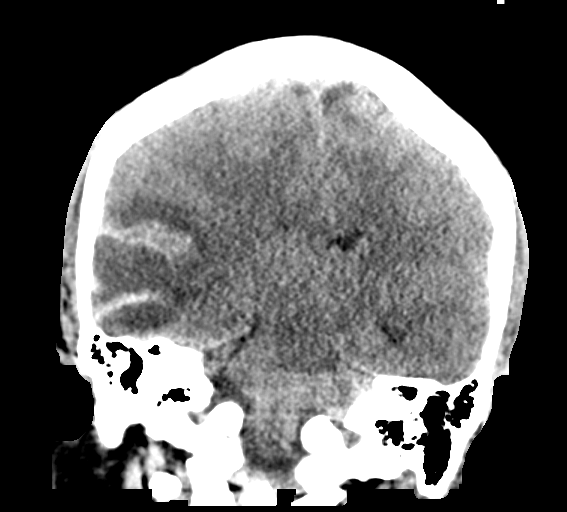

[Series 5: head without sag · sagittal · non-contrast · 0.33mm/px · 3 of 63 slices shown]
[im 21/63  brain]
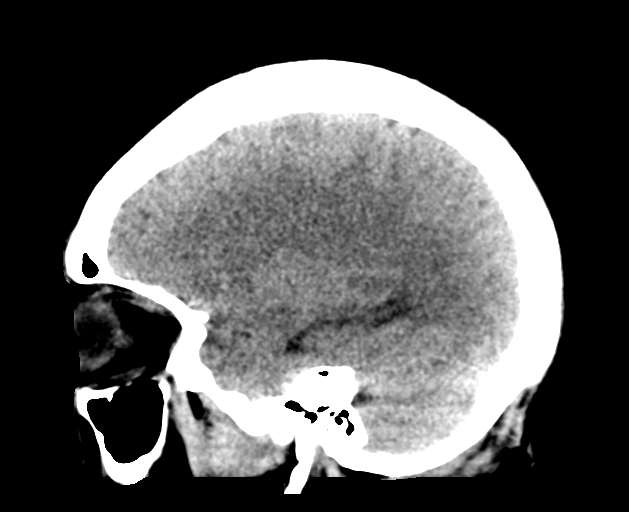
[im 32/63  brain]
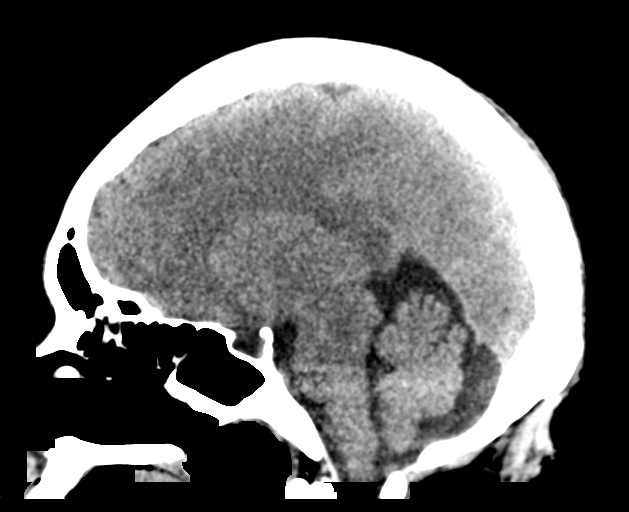
[im 42/63  brain]
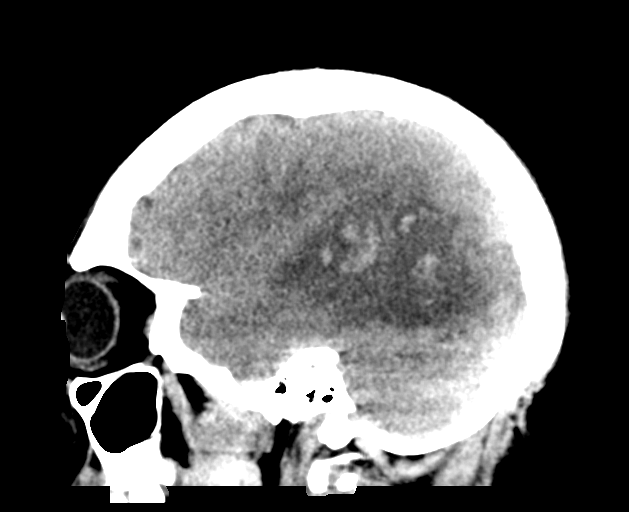

[Series 6: ax head bone · axial · 0.37mm/px · z∈[-260,-226]mm · 3 of 85 slices shown]
[im 9/85  bone]
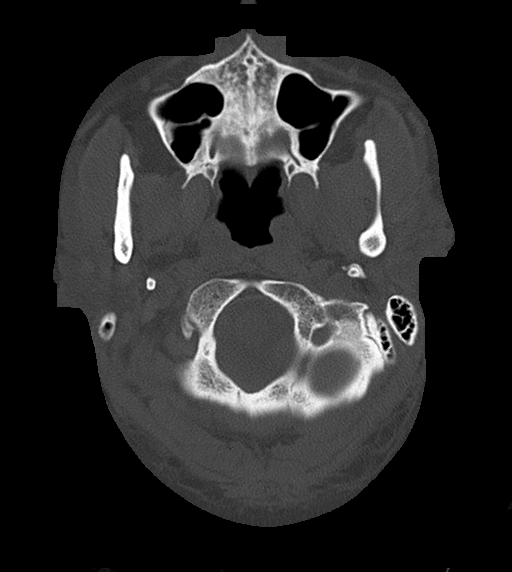
[im 17/85  bone]
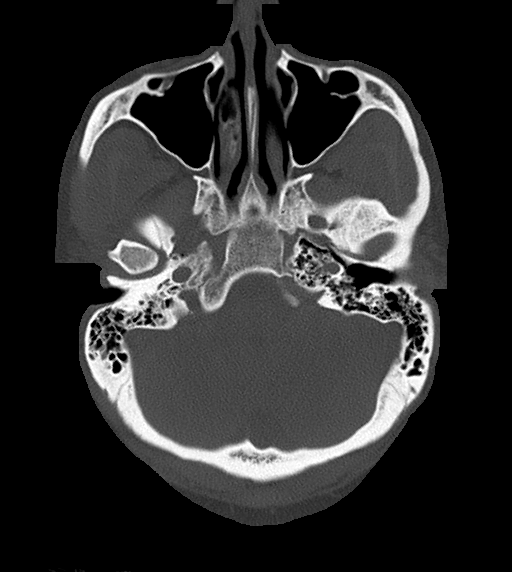
[im 26/85  bone]
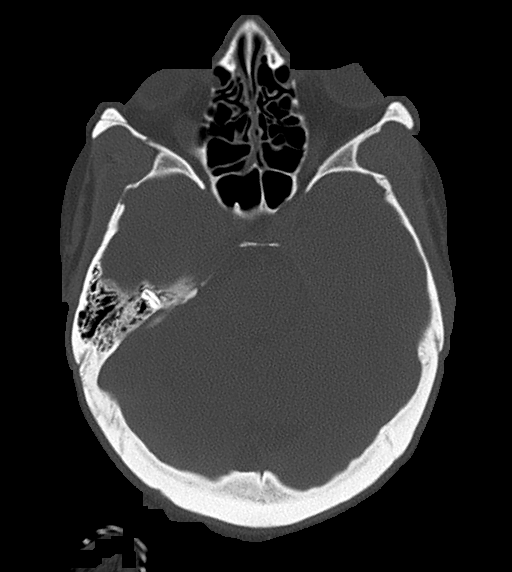

[16 of 47 positions shown; findings below may reference images not displayed]

FINDINGS: Brain: A large acute right inferior division MCA infarct is again
seen. Cytotoxic edema has mildly progressed with increased leftward
midline shift, now measuring 8 mm (previously 4 mm). Confluent
petechial type hemorrhage along the infarct and likely interspersed
subarachnoid hemorrhage are unchanged. There has been no significant
interval infarct extension. No new infarct is identified. The
ventricles are unchanged in size.

Vascular: No hyperdense vessel.

Skull: No fracture or suspicious osseous lesion.

Sinuses/Orbits: Small right mastoid effusion. Clear paranasal
sinuses. Unremarkable orbits.

Other: None.
IMPRESSION: Evolving hemorrhagic right MCA infarct with increased leftward
midline shift, now 8 mm.

## 2020-08-07 IMAGING — CT CT HEAD W/O CM
3 series · 15 of 47 positions shown, 18 images · non-contrast
Comparison: None.

CLINICAL DATA: Headache, nausea, vomiting

EXAM:
CT HEAD WITHOUT CONTRAST
TECHNIQUE: Contiguous axial images were obtained from the base of the skull
through the vertex without intravenous contrast.

[Series 3: head wo · axial · 0.44mm/px · z∈[-104,+36]mm · 9 of 34 slices shown, 12 images]
[im 3/34  brain]
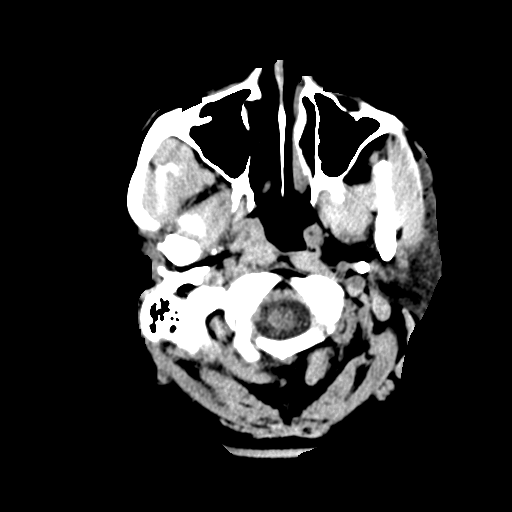
[im 3/34  bone]
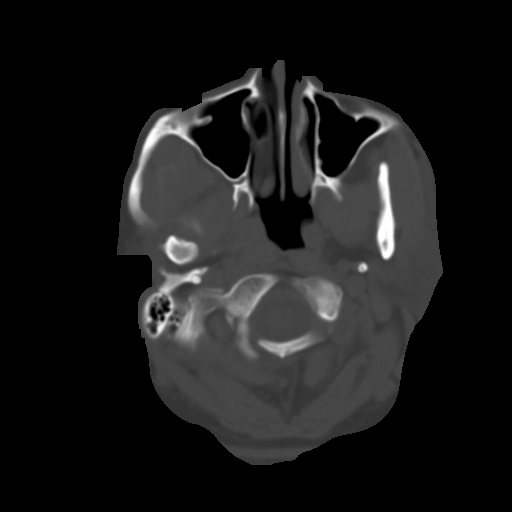
[im 6/34  brain]
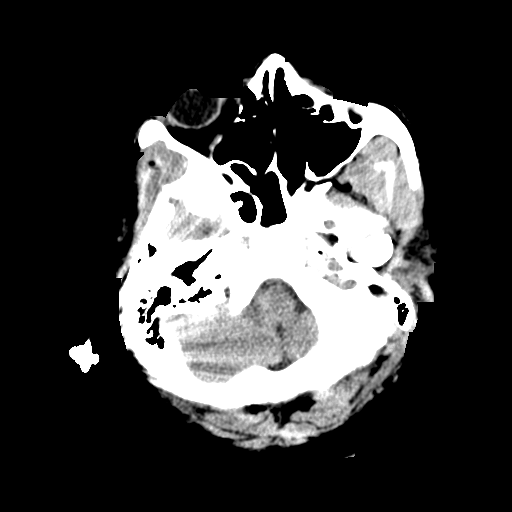
[im 10/34  brain]
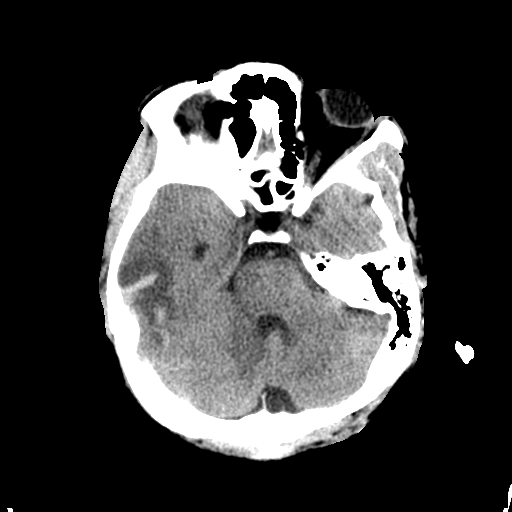
[im 13/34  brain]
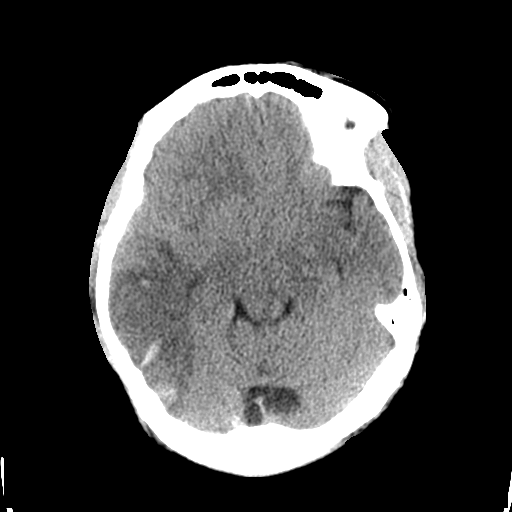
[im 18/34  brain]
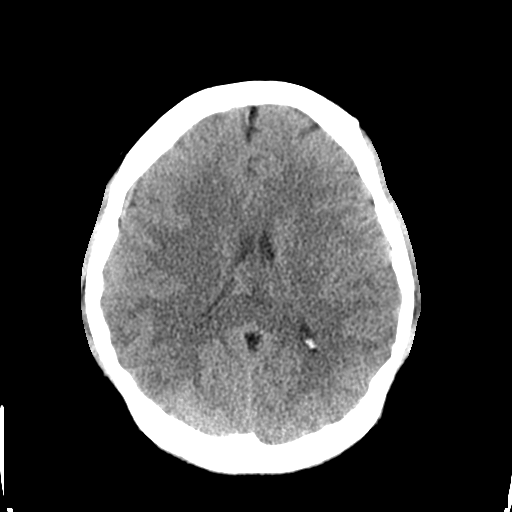
[im 18/34  bone]
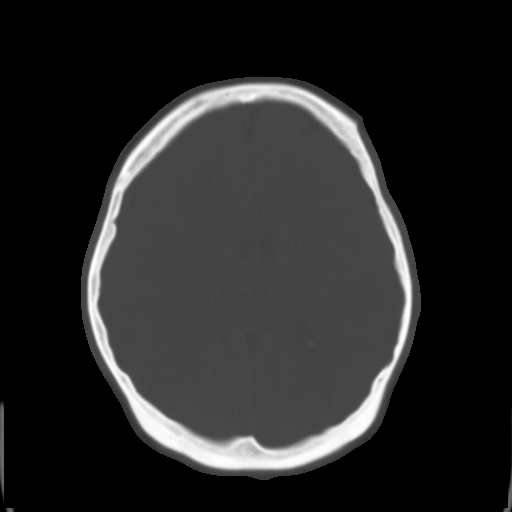
[im 21/34  brain]
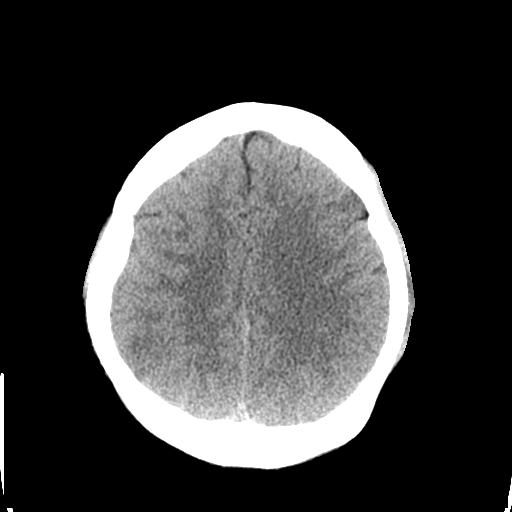
[im 24/34  brain]
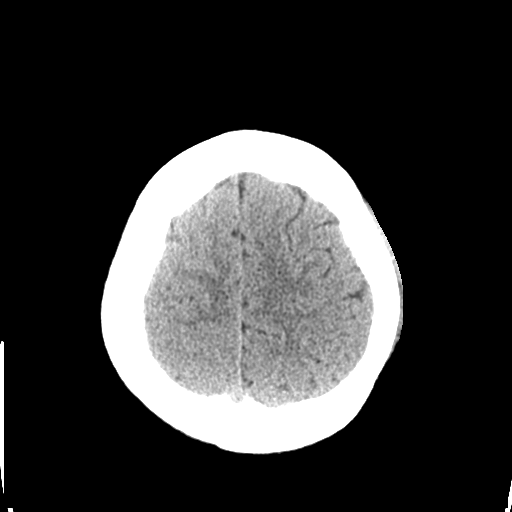
[im 28/34  brain]
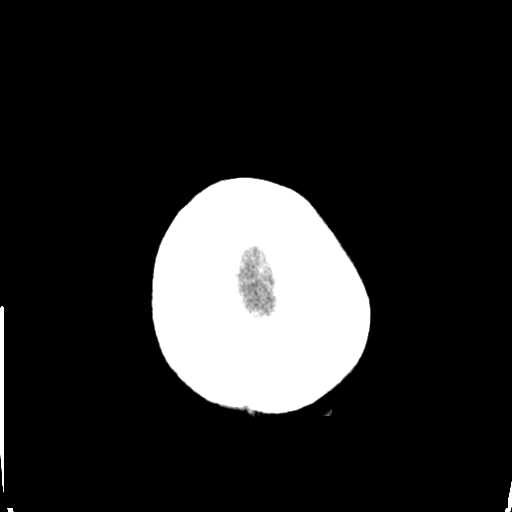
[im 31/34  brain]
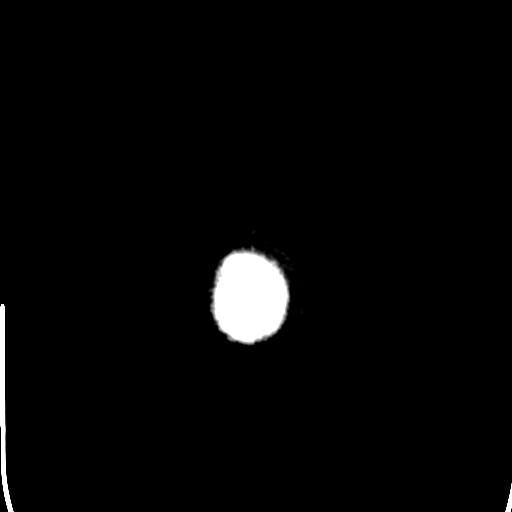
[im 31/34  bone]
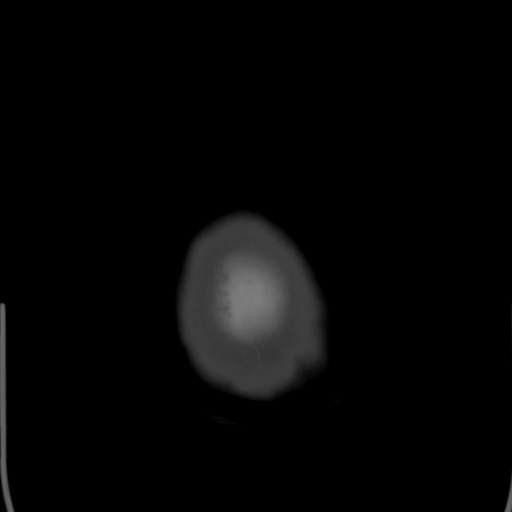

[Series 4: coronal soft tissue · coronal · 0.34mm/px · 3 of 71 slices shown]
[im 24/71  brain]
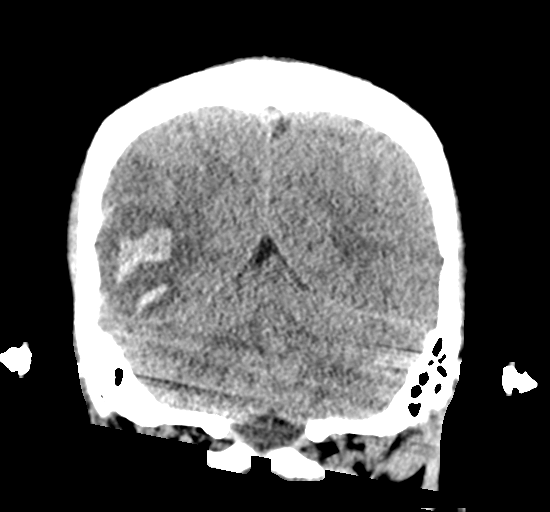
[im 32/71  brain]
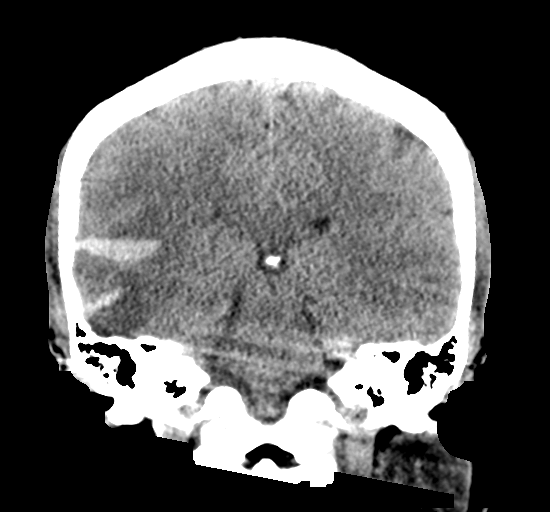
[im 39/71  brain]
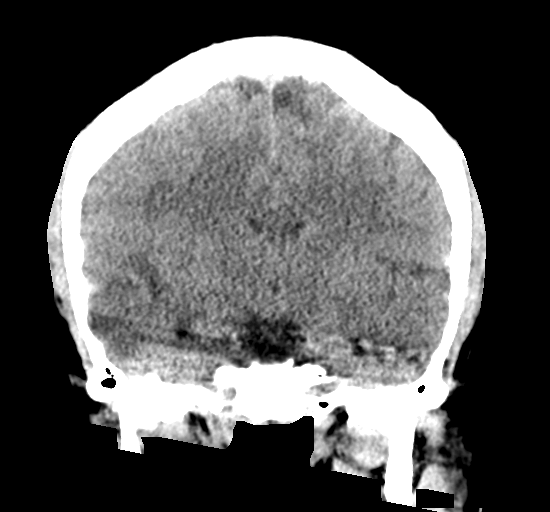

[Series 5: sagittal soft tissue · sagittal · 0.33mm/px · 3 of 58 slices shown]
[im 24/58  brain]
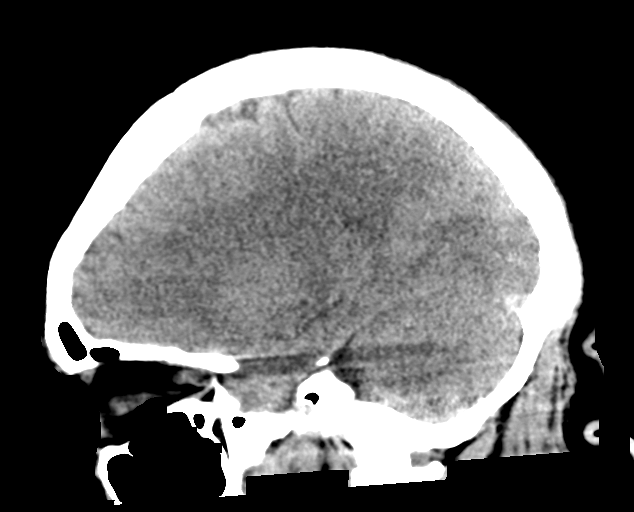
[im 30/58  brain]
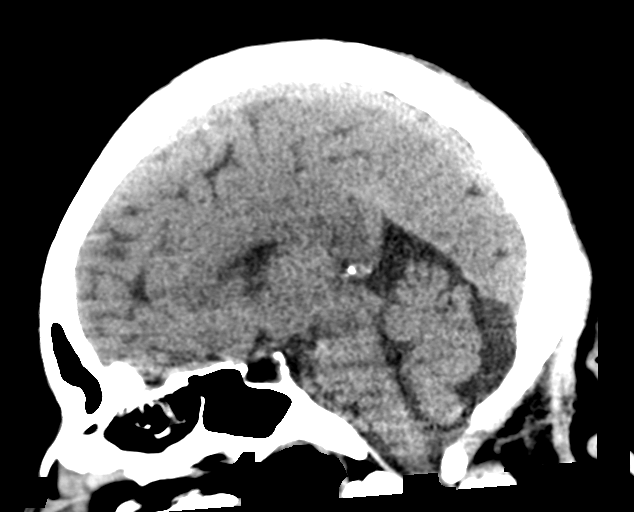
[im 35/58  brain]
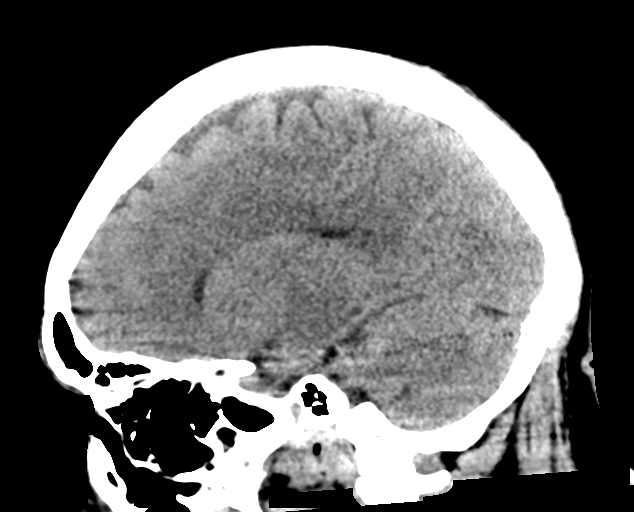

[15 of 47 positions shown; findings below may reference images not displayed]

FINDINGS: Brain: There is a large area of cytotoxic edema involving the
posterior temporal cortex most in keeping with a posterior MCA
territory infarct. There is extensive subarachnoid hemorrhage within
the area of cortical infarction. Mass effect is noted with
effacement of the a overlying sulci. Tiny subdural hematoma noted
lateral to the frontal lobe in the region of the sylvian fissure.

No midline shift. Ventricular size is normal. Cerebellum is
unremarkable.

Vascular: No asymmetric hyperdense vasculature at the skull base.

Skull: Intact

Sinuses/Orbits: The visualized orbits are unremarkable. The
visualized paranasal sinuses are clear.

Other: The mastoid air cells and middle ear cavities are clear.
IMPRESSION: Large posterior right MCA territory infarct involving the posterior
right temporal cortex with superimposed moderate subarachnoid
hemorrhage interdigitating within the sulci of the infarcted region
and tiny subdural hematoma. Mild mass effect. No midline shift.

These results were called by telephone at the time of interpretation
on [DATE] at [DATE] to provider QUI , who verbally
acknowledged these results.

## 2020-08-07 IMAGING — MR MR MRA HEAD W/O CM
1 of 2 series · 1 of 48 positions shown · non-contrast
Comparison: No pertinent prior exam.

CLINICAL DATA: Stroke workup

EXAM:
MRA HEAD WITHOUT CONTRAST
TECHNIQUE: Angiographic images of the Circle of Willis were acquired using MRA
technique without intravenous contrast.

[Series 1041: h-f · axial · 0.4mm · 0.41mm/px · 1 of 1 slices shown]
[im 1/1]
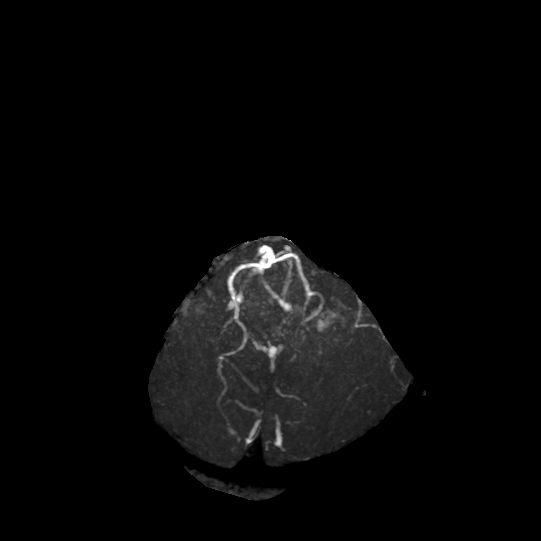

[1 of 48 positions shown; findings below may reference images not displayed]

FINDINGS: Limited by pervasive motion

Anterior circulation: No flow limiting stenosis suspected at the
cavernous carotids. Early right MCA bifurcation with blunted flow at
the M2 branches, likely accentuated by artifact. No gross aneurysm

Posterior circulation: Vertebral and basilar arteries appear
diffusely patent. No evidence of branch occlusion, gross aneurysm,
or flow limiting stenosis.

Anatomic variants: No significant area.
IMPRESSION: 1. Significantly degraded by motion.
2. Narrowed/irregularity right M2 branches correlating with the
acute infarct. No detectable arterial disease elsewhere.

## 2020-08-07 IMAGING — CT CT HEAD W/O CM
4 series · 15 of 47 positions shown, 17 images · non-contrast
Comparison: Head CT from earlier today

CLINICAL DATA: Follow-up cerebral hemorrhage

EXAM:
CT HEAD WITHOUT CONTRAST
TECHNIQUE: Contiguous axial images were obtained from the base of the skull
through the vertex without intravenous contrast.

[Series 3: ax head wo · axial · 0.33mm/px · z∈[-119,+16]mm · 7 of 42 slices shown, 9 images]
[im 6/42  brain]
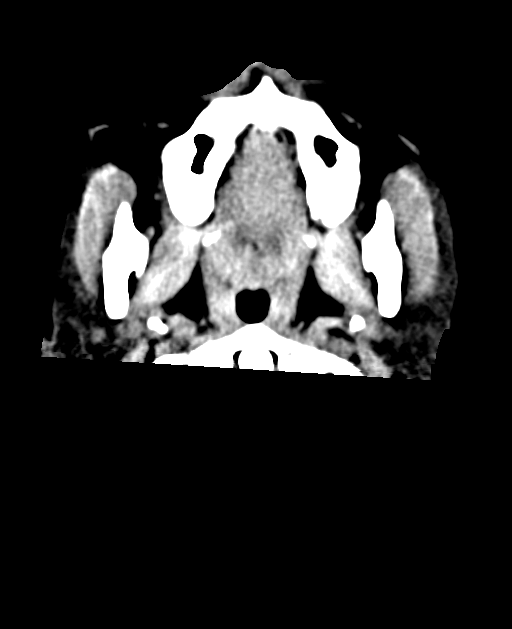
[im 6/42  bone]
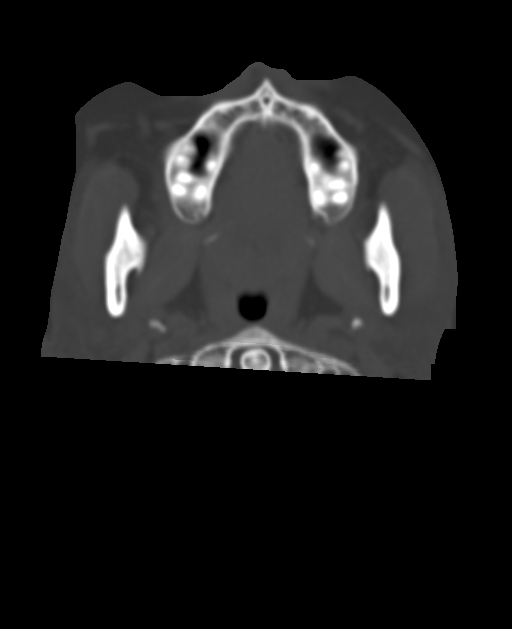
[im 11/42  brain]
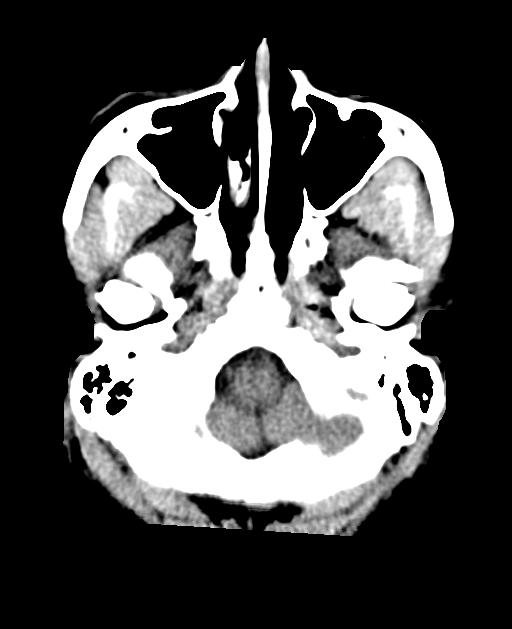
[im 16/42  brain]
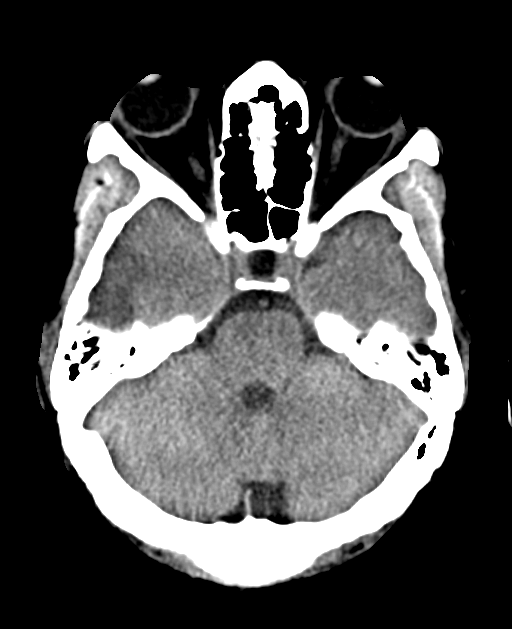
[im 21/42  brain]
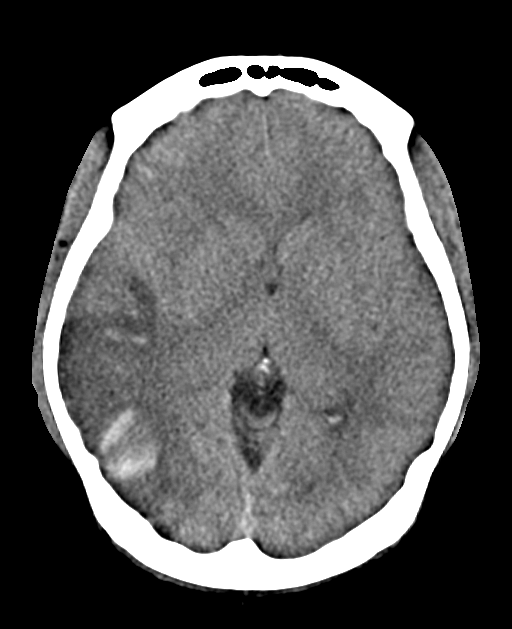
[im 26/42  brain]
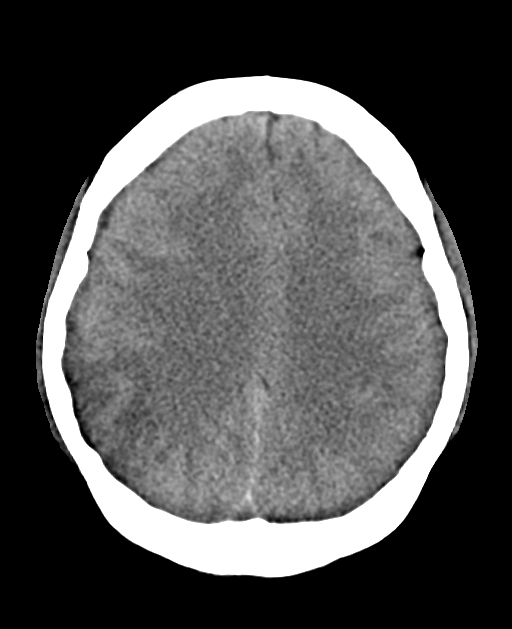
[im 26/42  bone]
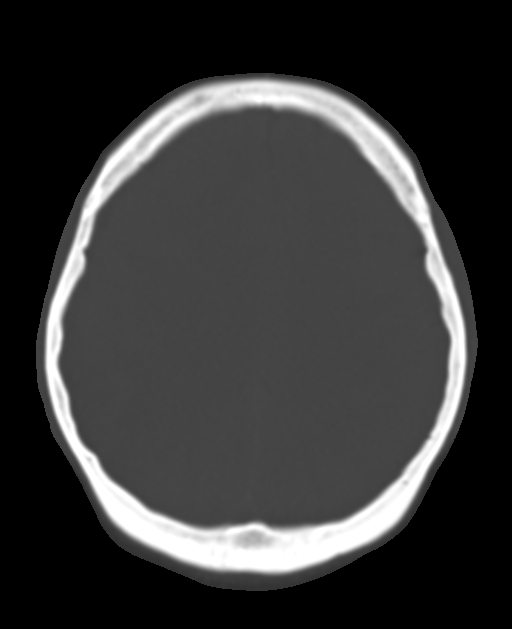
[im 31/42  brain]
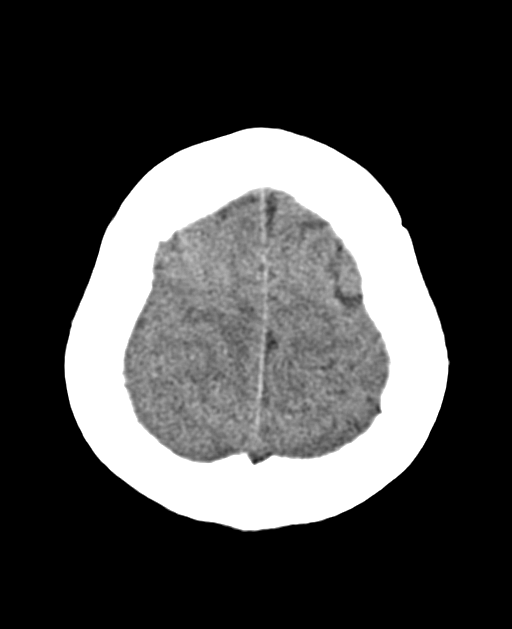
[im 36/42  brain]
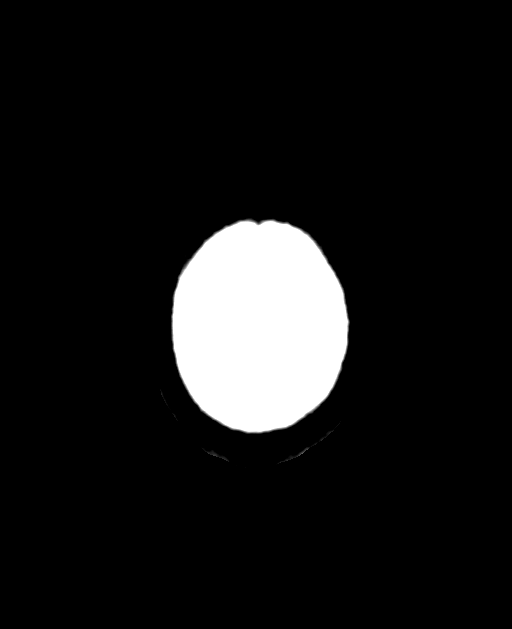

[Series 4: ax head bone · axial · 0.33mm/px · z∈[-120,-102]mm · 2 of 103 slices shown]
[im 11/103  bone]
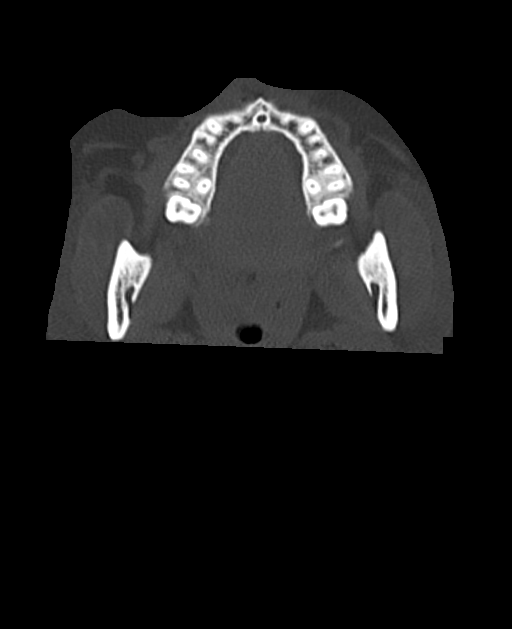
[im 21/103  bone]
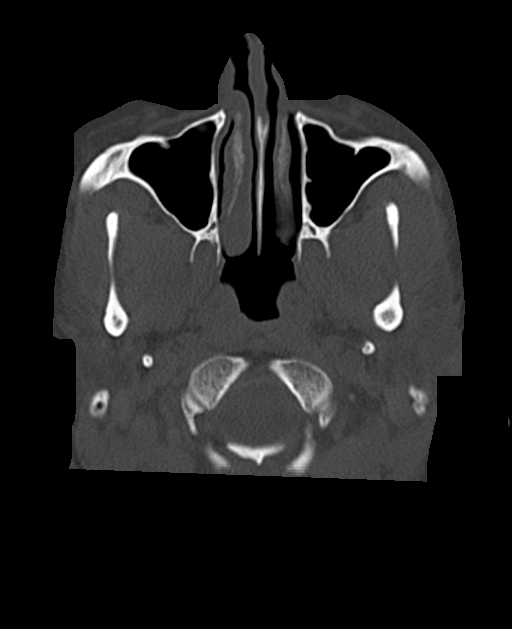

[Series 5: cor soft · coronal · 0.33mm/px · 3 of 70 slices shown]
[im 25/70  brain]
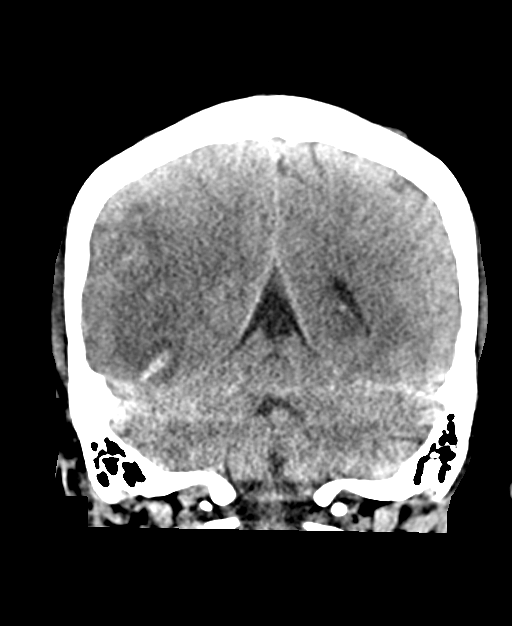
[im 32/70  brain]
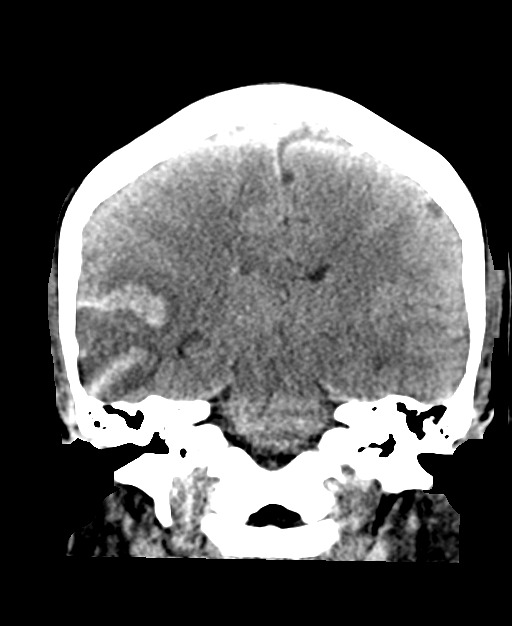
[im 38/70  brain]
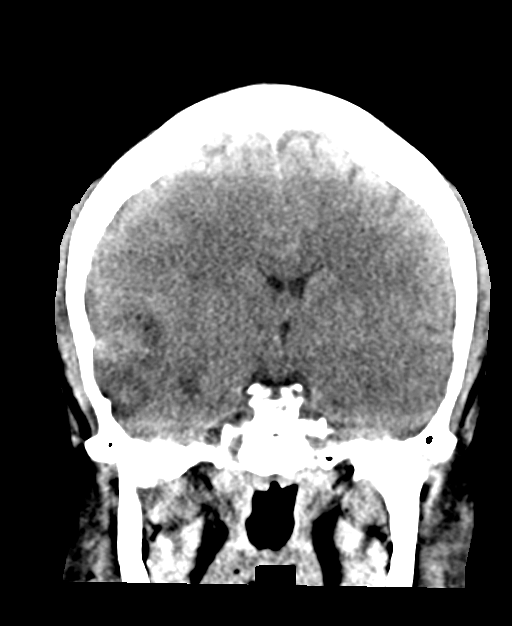

[Series 6: sag soft · sagittal · 0.41mm/px · 3 of 56 slices shown]
[im 19/56  brain]
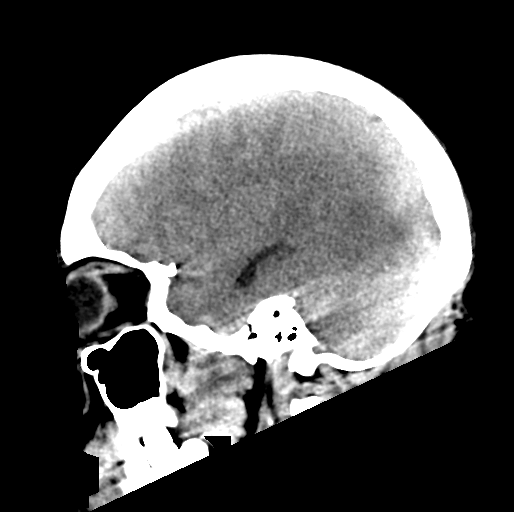
[im 28/56  brain]
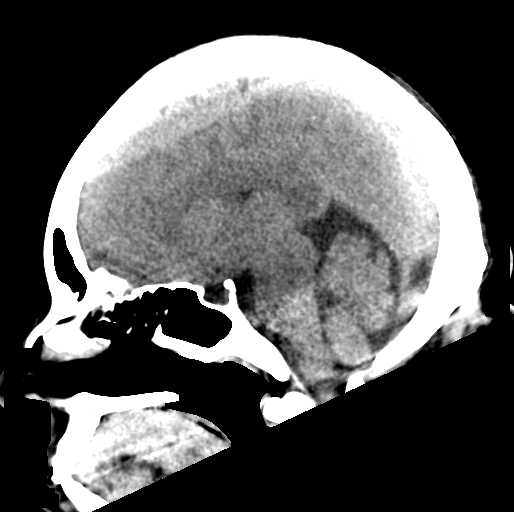
[im 37/56  brain]
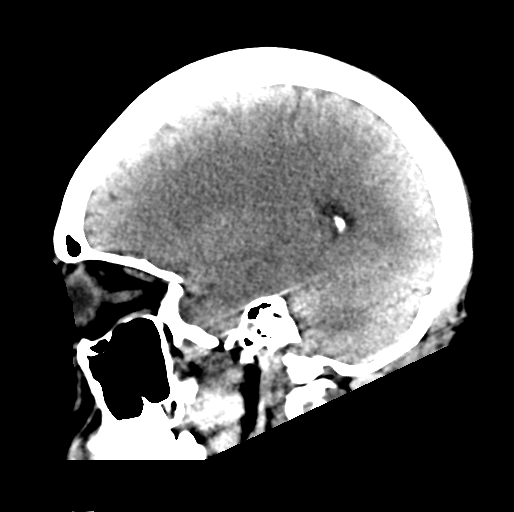

[15 of 47 positions shown; findings below may reference images not displayed]

FINDINGS: Brain: Unchanged extent of cytotoxic edema in the right
posterolateral temporal lobe and parietal lobe, inferior division
MCA territory. There is petechial type hemorrhage along the surface
of the affected brain. Swelling causes 4 mm of mass effect towards
the left of the midline. No entrapment or hydrocephalus.

Vascular: No hyperdense vessel or unexpected calcification.

Skull: Normal. Negative for fracture or focal lesion.

Sinuses/Orbits: No acute finding.
IMPRESSION: Unchanged extent of hemorrhagic right MCA territory infarct with 4
mm of midline shift.

## 2020-08-07 MED ORDER — SENNOSIDES-DOCUSATE SODIUM 8.6-50 MG PO TABS
1.0000 | ORAL_TABLET | Freq: Two times a day (BID) | ORAL | Status: DC
  Filled 2020-08-07 (×2): qty 1

## 2020-08-07 MED ORDER — LEVETIRACETAM IN NACL 1000 MG/100ML IV SOLN
1000.0000 mg | Freq: Once | INTRAVENOUS | Status: AC
  Administered 2020-08-07: 1000 mg via INTRAVENOUS
  Filled 2020-08-07: qty 100

## 2020-08-07 MED ORDER — CHLORHEXIDINE GLUCONATE CLOTH 2 % EX PADS
6.0000 | MEDICATED_PAD | Freq: Every day | CUTANEOUS | Status: DC
  Administered 2020-08-07 – 2020-08-16 (×10): 6 via TOPICAL

## 2020-08-07 MED ORDER — CHLORHEXIDINE GLUCONATE 0.12 % MT SOLN
15.0000 mL | Freq: Two times a day (BID) | OROMUCOSAL | Status: DC
  Administered 2020-08-07 – 2020-08-09 (×5): 15 mL via OROMUCOSAL
  Filled 2020-08-07 (×2): qty 15

## 2020-08-07 MED ORDER — LEVETIRACETAM IN NACL 500 MG/100ML IV SOLN
500.0000 mg | Freq: Two times a day (BID) | INTRAVENOUS | Status: DC
  Administered 2020-08-07 – 2020-08-12 (×10): 500 mg via INTRAVENOUS
  Filled 2020-08-07 (×10): qty 100

## 2020-08-07 MED ORDER — PANTOPRAZOLE SODIUM 40 MG IV SOLR
40.0000 mg | Freq: Every day | INTRAVENOUS | Status: DC
  Administered 2020-08-07 – 2020-08-09 (×3): 40 mg via INTRAVENOUS
  Filled 2020-08-07 (×3): qty 40

## 2020-08-07 MED ORDER — SODIUM CHLORIDE 3 % IV SOLN
INTRAVENOUS | Status: DC
  Administered 2020-08-08: 75 mL/h via INTRAVENOUS
  Filled 2020-08-07 (×13): qty 500

## 2020-08-07 MED ORDER — ACETAMINOPHEN 650 MG RE SUPP
650.0000 mg | RECTAL | Status: DC | PRN
  Administered 2020-08-07 – 2020-08-11 (×5): 650 mg via RECTAL
  Filled 2020-08-07 (×6): qty 1

## 2020-08-07 MED ORDER — ACETAMINOPHEN 325 MG PO TABS: 650.0000 mg | ORAL_TABLET | ORAL | Status: DC | PRN

## 2020-08-07 MED ORDER — STROKE: EARLY STAGES OF RECOVERY BOOK
Freq: Once | Status: DC
  Filled 2020-08-07: qty 1

## 2020-08-07 MED ORDER — ACETAMINOPHEN 160 MG/5ML PO SOLN
650.0000 mg | ORAL | Status: DC | PRN
Start: 2020-08-07 — End: 2020-08-17
  Administered 2020-08-09 – 2020-08-14 (×14): 650 mg
  Filled 2020-08-07 (×13): qty 20.3

## 2020-08-07 MED ORDER — INSULIN ASPART 100 UNIT/ML IJ SOLN
0.0000 [IU] | INTRAMUSCULAR | Status: DC
  Administered 2020-08-07: 5 [IU] via SUBCUTANEOUS
  Administered 2020-08-07 – 2020-08-08 (×3): 3 [IU] via SUBCUTANEOUS
  Administered 2020-08-08: 5 [IU] via SUBCUTANEOUS
  Administered 2020-08-08 – 2020-08-09 (×4): 3 [IU] via SUBCUTANEOUS
  Administered 2020-08-09: 5 [IU] via SUBCUTANEOUS
  Administered 2020-08-09: 3 [IU] via SUBCUTANEOUS
  Administered 2020-08-09: 5 [IU] via SUBCUTANEOUS
  Administered 2020-08-09: 3 [IU] via SUBCUTANEOUS
  Administered 2020-08-09: 5 [IU] via SUBCUTANEOUS
  Administered 2020-08-10: 3 [IU] via SUBCUTANEOUS
  Administered 2020-08-10: 2 [IU] via SUBCUTANEOUS
  Administered 2020-08-10: 3 [IU] via SUBCUTANEOUS
  Administered 2020-08-10 (×3): 2 [IU] via SUBCUTANEOUS
  Administered 2020-08-11 (×2): 3 [IU] via SUBCUTANEOUS
  Administered 2020-08-11: 5 [IU] via SUBCUTANEOUS
  Administered 2020-08-11: 3 [IU] via SUBCUTANEOUS
  Administered 2020-08-11 – 2020-08-12 (×3): 8 [IU] via SUBCUTANEOUS
  Administered 2020-08-12: 5 [IU] via SUBCUTANEOUS
  Administered 2020-08-12 – 2020-08-13 (×4): 8 [IU] via SUBCUTANEOUS
  Administered 2020-08-13: 2 [IU] via SUBCUTANEOUS
  Administered 2020-08-13 (×2): 5 [IU] via SUBCUTANEOUS
  Administered 2020-08-13: 2 [IU] via SUBCUTANEOUS
  Administered 2020-08-13 (×2): 8 [IU] via SUBCUTANEOUS
  Administered 2020-08-14: 15 [IU] via SUBCUTANEOUS
  Administered 2020-08-14: 3 [IU] via SUBCUTANEOUS
  Administered 2020-08-14: 8 [IU] via SUBCUTANEOUS
  Administered 2020-08-14: 5 [IU] via SUBCUTANEOUS
  Administered 2020-08-14: 8 [IU] via SUBCUTANEOUS
  Administered 2020-08-15: 3 [IU] via SUBCUTANEOUS
  Administered 2020-08-15 (×2): 5 [IU] via SUBCUTANEOUS
  Administered 2020-08-15: 3 [IU] via SUBCUTANEOUS
  Administered 2020-08-15: 5 [IU] via SUBCUTANEOUS
  Administered 2020-08-15: 8 [IU] via SUBCUTANEOUS
  Administered 2020-08-16: 3 [IU] via SUBCUTANEOUS
  Administered 2020-08-16: 5 [IU] via SUBCUTANEOUS
  Administered 2020-08-16: 2 [IU] via SUBCUTANEOUS
  Administered 2020-08-16 (×2): 3 [IU] via SUBCUTANEOUS

## 2020-08-07 MED ORDER — ORAL CARE MOUTH RINSE
15.0000 mL | Freq: Two times a day (BID) | OROMUCOSAL | Status: DC
  Administered 2020-08-07 – 2020-08-08 (×3): 15 mL via OROMUCOSAL

## 2020-08-07 NOTE — ED Notes (Addendum)
Echo at bedside- pt became restless, trying to get up. Pt could not be redirected and would not follow commands. Assisted pt to bedside commode and then back into bed. Pt was able to empty bladder and appears to able to tell that she needs to urinate. Pt would not open eyes but is able to support her weight, however requires multiple assists for safety as pt is not stable or able to balance herself safely.

## 2020-08-07 NOTE — ED Triage Notes (Signed)
The pt moves all extremities not really following commands. Speaking spanish but she spoke her age in english opens both eyes with a sternal rub.  Responds to painful stimulu pinches  Rubbing the rt side of her head unable to determine if the pt can see  Neuro at  The bedside doing the exam

## 2020-08-07 NOTE — Progress Notes (Signed)
Carotid duplex bilateral study completed.   Please see CV Proc for preliminary results.   Atwell Mcdanel, RDMS, RVT  

## 2020-08-07 NOTE — Progress Notes (Signed)
Attempted to wake patient for mNIHSS at 1800.  Required more stimulation to get any response than earlier at 1700.  Attempted spanish interpreter via  [employer]. No improvement or response. Spontaneously moving arms. Dr. Amada Jupiter was notified and will repeat CT ASAP

## 2020-08-07 NOTE — Progress Notes (Signed)
  Echocardiogram 2D Echocardiogram has been performed.  Regina Morris F 07/27/2020, 1:23 PM

## 2020-08-07 NOTE — Progress Notes (Signed)
Not able to perform EEG at this time.  Patient states, via sister/friend, her head hurt [hold the right side of her head] badly and doesn't want her head touched. Tech will try again once patient has had CT.

## 2020-08-07 NOTE — H&P (Signed)
NEUROLOGY CONSULTATION NOTE   Date of service: Aug 07, 2020 Patient Name: Regina Morris MRN:  [id number] DOB:  [birth year] Reason for consult: "R MCA stroke" Requesting Provider: No att. providers found _ _ _   _ __   _ __ _ _  __ __   _ __   __ _  History of Present Illness  Regina Morris is a 40 y.o. female with PMH significant for gestational diabetes, kidney stone who presented to [facility] ED with nausea, vomiting and R temple headache. Workup with CTH demonstrated a R posterior MCA stroke with moderate subarachnoid hemorrhage interdigitating within the sulci of the infarcted region and tiny SDH with mild mass effect.  She was seen by teleneurology and recommended Keppra load and repeat CTH in 6-12 hours. She was transferred to [facility] for further workup and close monitoring.  She speaks spanish. Is very somnolent on my evaluation and refues to participate. With encouragement and tactile stimulation, briefly wakes up and follows simple command before going back to sleep. Endorses R temple headache and light bothering her. Unable to provide much meaningful history at this time thou.  Per ED notes,   MRS: 0 TPA/Thrombectomy: Not a candidate, stroke appears completed. LKW: 08/05/20(unable to determine time) NIHSS: NIHSS components Score: Comment  1a Level of Conscious 0[]  1[]  2[x]  3[]      1b LOC Questions 0[]  1[x]  2[]       1c LOC Commands 0[x]  1[]  2[]       2 Best Gaze 0[x]  1[]  2[]       3 Visual 0[]  1[x]  2[]  3[]    ? some L vision deficit  4 Facial Palsy 0[x]  1[]  2[]  3[]      5a Motor Arm - left 0[x]  1[]  2[]  3[]  4[]  UN[]    5b Motor Arm - Right 0[x]  1[]  2[]  3[]  4[]  UN[]    6a Motor Leg - Left 0[x]  1[]  2[]  3[]  4[]  UN[]    6b Motor Leg - Right 0[x]  1[]  2[]  3[]  4[]  UN[]    7 Limb Ataxia 0[x]  1[]  2[]  3[]  UN[]     8 Sensory 0[x]  1[]  2[]  UN[]      9 Best Language 0[x]  1[]  2[]  3[]      10 Dysarthria 0[x]  1[]  2[]  UN[]      11 Extinct. and Inattention 0[x]  1[]  2[]       TOTAL: 4       ROS   Unable to obtain ROS, PMH, PSH, FHx, SHx 2/2 somnolence.  Past History   Past Medical History:  Diagnosis Date  . Gestational diabetes   . Kidney stone    No past surgical history on file. Family History  Problem Relation Age of Onset  . Uterine cancer Mother   . Diabetes Maternal Grandfather    Social History   Socioeconomic History  . Marital status: Single    Spouse name: Not on file  . Number of children: Not on file  . Years of education: Not on file  . Highest education level: Not on file  Occupational History  . Not on file  Tobacco Use  . Smoking status: Never Smoker  . Smokeless tobacco: Never Used  Vaping Use  . Vaping Use: Never used  Substance and Sexual Activity  . Alcohol use: No  . Drug use: Never  . Sexual activity: Not on file  Other Topics Concern  . Not on file  Social History Narrative  . Not on file   Social Determinants of Health   Financial Resource Strain: Not  on file  Food Insecurity: Not on file  Transportation Needs: Not on file  Physical Activity: Not on file  Stress: Not on file  Social Connections: Not on file   Allergies  Allergen Reactions  . Penicillins Rash    Medications  (Not in a hospital admission)    Vitals   There were no vitals filed for this visit.   There is no height or weight on file to calculate BMI.  Physical Exam   General: Laying comfortably in bed; asleep. HENT: Normal oropharynx and mucosa. Normal external appearance of ears and nose. Neck: Supple, no pain or tenderness CV: No JVD. No peripheral edema. Pulmonary: Symmetric Chest rise. Normal respiratory effort. Abdomen: Soft to touch, non-tender. Ext: No cyanosis, edema, or deformity Skin: No rash. Normal palpation of skin.  Musculoskeletal: Normal digits and nails by inspection. No clubbing.   Neurologic Examination  Mental status/Cognition:Asleep, eyes closed, opens eyes briefly to sternal rub and then back to sleep within a few  secs. oriented to self. Poor attention. Speech/language: Fluent, sticks tongue out on command. Able to name thumb. Cranial nerves:   CN II Pupils equal and reactive to light, suspect some L hemivisual field cut but closes eyes immediately and difficult to assess given somnolence.   CN III,IV,VI no gaze preference or deviation, no nystagmus in primary gaze, unable to assess EOM.   CN V normal sensation in V1, V2, and V3 segments bilaterally   CN VII no asymmetry, no nasolabial fold flattening   CN VIII normal hearing to speech   CN IX & X    CN XI    CN XII midline tongue protrusion   Motor:  Muscle bulk: normal, tone normal Unable to do detailed strength testing due to somnolence. Localizes to pain in all extremities, turns herself in the bed and spotaneously and purposefully moves all extremities.  Reflexes:  Right Left Comments  Pectoralis      Biceps (C5/6) 1 1   Brachioradialis (C5/6) 1 1    Triceps (C6/7) 1 1    Patellar (L3/4) 1 1    Achilles (S1)      Hoffman      Plantar     Jaw jerk    Sensation:  Light touch Localizes to pinch in all extremities.   Pin prick    Temperature    Vibration   Proprioception    Coordination/Complex Motor:  - Finger to Nose unable to assess but no obvious ataxia or tremor.   Labs   CBC:  Recent Labs  Lab 08/06/20 2048  WBC 20.5*  HGB 9.3*  HCT 34.2*  MCV 60.4*  PLT 715*    Basic Metabolic Panel:  Lab Results  Component Value Date   NA 135 08/06/2020   K 3.7 08/06/2020   CO2 20 (L) 08/06/2020   GLUCOSE 290 (H) 08/06/2020   BUN 9 08/06/2020   CREATININE 0.61 08/06/2020   CALCIUM 8.6 (L) 08/06/2020   GFRNONAA >60 08/06/2020   GFRAA >60 12/14/2019   Lipid Panel: No results found for: LDLCALC HgbA1c:  Lab Results  Component Value Date   HGBA1C 11.7 (H) 12/12/2019   Urine Drug Screen: No results found for: LABOPIA, COCAINSCRNUR, LABBENZ, AMPHETMU, THCU, LABBARB  Alcohol Level     Component Value Date/Time   ETH  <10 07/16/2020 0424    CT Head without contrast: Large posterior right MCA territory infarct involving the posterior right temporal cortex with superimposed moderate subarachnoid hemorrhage interdigitating within the sulci  of the infarcted region and tiny subdural hematoma. Mild mass effect. No midline shift.   MR Angio head without contrast and Carotid Duplex BL: pending  MRI Brain: 1. Acute infarct involving the inferior division right MCA territory with petechial hemorrhage and 4 mm midline shift. 2. Diffusion only study due to patient condition.   Impression   Brandy Zuba is a 40 y.o. female with PMH significant for gestational diabetes, kidney stone who presented to [facility] ED with nausea, vomiting and R temple headache. Found to have a moderate size R posterior MCA stroke with moderate subarachnoid hemorrhage interdigitating within the sulci of the infarcted region and tiny SDH with mild mass effect and a 41mm midline shift. Her neurologic examination is notable for somnolence, ? Partial Left visual field deficit.  Primary Diagnosis:  Cerebral infarction due to embolism of  right middle cerebral artery.   Subarachnoid Hemorrhage associated with the stroke.  Secondary Diagnosis: Brain compression and Cerebral edema  Recommendations   Plan:  - Admit to ICU - Repeat CTH in 6 hours. - Frequent Neuro checks per stroke unit protocol - Vascular imaging with MRA Angio Head without contrast and US Carotid doppler - TTE with bubble study pending - Lipid profile with LDL pending - Will start statin if LDL > 70 - HbA1c pending. - Antithrombotic - Hold off on antiplatelet agents at this time given associated SAH. - DVT ppx - Goal Blood pressure < 140/90. - Recommend Telemetry monitoring for arrythmia - Recommend bedside swallow screen prior to PO intake. - Stroke education booklet - Recommend PT/OT/SLP consult - hypercoagulable workup pending. - Urine Drug screen  pending.  ______________________________________________________________________  This patient is critically ill and at significant risk of neurological worsening, death and care requires constant monitoring of vital signs, hemodynamics,respiratory and cardiac monitoring, neurological assessment, discussion with family, other specialists and medical decision making of high complexity. I spent 35 minutes of neurocritical care time  in the care of  this patient. This was time spent independent of any time provided by nurse practitioner or PA.  Erick Blinks [facility] Pager Number [phone] 07/29/2020  7:27 AM   Thank you for the opportunity to take part in the care of this patient. If you have any further questions, please contact the neurology consultation attending.  Signed,  Erick Blinks [facility] Pager Number [phone] _ _ _   _ __   _ __ _ _  __ __   _ __   __ _

## 2020-08-07 NOTE — Progress Notes (Signed)
STROKE TEAM PROGRESS NOTE   INTERVAL HISTORY Her RN is at the bedside.  She presented with headache and altered mental status outside the window for tPA and CT scan shows hemorrhagic right MCA temporal infarct with small intraparenchymal and subarachnoid hemorrhage.  She was not cooperative for an MRI and only diffusion sequence could be obtained.  Blood pressure adequately controlled.  No witnessed seizure activity but she has been started on Keppra.  Vitals:   08/03/2020 0815 08/03/2020 0830 08/10/2020 0845 07/30/2020 0900  BP: 122/73 125/70  (!) 105/55  Pulse: 68 71 70 65  Resp: (!) 22 20 (!) 23 (!) 21  Temp:      TempSrc:      SpO2: 100% 99% 100% 100%  Weight:      Height:       CBC:  Recent Labs  Lab 08/06/20 2048  WBC 20.5*  HGB 9.3*  HCT 34.2*  MCV 60.4*  PLT 715*   Basic Metabolic Panel:  Recent Labs  Lab 08/06/20 2048  NA 135  K 3.7  CL 100  CO2 20*  GLUCOSE 290*  BUN 9  CREATININE 0.61  CALCIUM 8.6*   Lipid Panel:  Recent Labs  Lab 08/06/2020 0755  CHOL 276*  TRIG 168*  HDL 45  CHOLHDL 6.1  VLDL 34  LDLCALC 829*   HgbA1c: No results for input(s): HGBA1C in the last 168 hours. Urine Drug Screen: No results for input(s): LABOPIA, COCAINSCRNUR, LABBENZ, AMPHETMU, THCU, LABBARB in the last 168 hours.  Alcohol Level  Recent Labs  Lab 08/12/2020 0424  ETH <10    IMAGING  CT ABDOMEN PELVIS WO CONTRAST Result Date: 07/14/2020 IMPRESSION: No acute intra-abdominal pathology identified.   DG Chest 2 View Result Date: 07/23/2020 MPRESSION: No active cardiopulmonary disease.   CT Head Wo Contrast Result Date: 08/04/2020 IMPRESSION: Large posterior right MCA territory infarct involving the posterior right temporal cortex with superimposed moderate subarachnoid hemorrhage interdigitating within the sulci of the infarcted region and tiny subdural hematoma. Mild mass effect. No midline shift.   MR ANGIO HEAD WO CONTRAST Result Date: 07/16/2020 IMPRESSION:  1.  Significantly degraded by motion.  2. Narrowed/irregularity right M2 branches correlating with the acute infarct. No detectable arterial disease elsewhere.   MR BRAIN WO CONTRAST Result Date: 07/29/2020 CT. IMPRESSION:  1. Acute infarct involving the inferior division right MCA territory with petechial hemorrhage and 4 mm midline shift.  2. Diffusion only study due to patient condition.    PHYSICAL EXAM [name].  Not in distress. . Afebrile. Head is nontraumatic. Neck is supple without bruit.    Cardiac exam no murmur or gallop. Lungs are clear to auscultation. Distal pulses are well felt. Neurological Exam:  Patient is drowsy but can be aroused and opens eyes.  Using Spanish language interpreter she is able to communicate.  She has mild dysarthria.  She has right gaze deviation and did not look to the left past midline.  She blinks to threat on the right but not on the left.  She has left lower facial weakness.  She follows commands well.  She has dense left hemiplegia with suspect hemineglect as well.  She has purposeful antigravity movements on the right side.  Right plantar is downgoing left is equivocal.  Gait not tested.  ASSESSMENT/PLAN [name] is a 40 y.o. female with history of gestational diabetes, kidney stone  presenting with nausea, vomiting and R temple headache.  CT head demonstrated a right  posterior MCA stroke with moderate subarachnoid hemorrhage interdigitating within the sulci of the infarcted region and tiny SDH with mild mass effect.   Stroke:  right posterior MCA infarct likely due to narrowing of right M2 branches   MRI  Acute infarct involving the inferior division right MCA territory with petechial hemorrhage and 4 mm midline shift.   MRA Narrowed/irregularity right M2 branches correlating with the acute infarct.  Carotid Doppler  pending  2D Echo pending  SARs coronavirus negative  LDL No results found for requested labs within  last 27062 hours.  HgbA1c 11.7  VTE prophylaxis - SCDs    Diet   Diet NPO time specified   Diet NPO time specified    No antithrombotic prior to admission, will not start at this time due to subarachnoid hemorrhage and tiny SDH  Therapy recommendations:  pending  Disposition:  pending  Hypertension  Home meds:  none  Stable . SBP goal < 160 to prevent further hemorrhage . Long-term BP goal normotensive  Hyperlipidemia  Home meds:  none,  LDL No results found for requested labs within last 37628 hours., goal < 70  Continue statin at discharge  Gestational Diabetes   Home meds:  Metformin 500mg  daily  HgbA1c 11.7, goal < 7.0  CBGs Recent Labs    08/06/20 2058 07/25/2020 0718  GLUCAP 260* 228*      SSI  Other Stroke Risk Factors    Other Active Problems     Hospital day # 0 I have personally obtained history,examined this patient, reviewed notes, independently viewed imaging studies, participated in medical decision making and plan of care.ROS completed by me personally and pertinent positives fully documented  I have made any additions or clarifications directly to the above note. Agree with note above.  Patient has a moderate-sized right MCA infarct with hemorrhagic transformation and cytotoxic edema and mild midline shift.  Plan admit to ICU bed.  Start hypertonic saline through peripheral IV at 75 cc an hour with the serum sodium goal between 150-155.  Repeat CT scan of the head without contrast tomorrow morning.  Keep n.p.o. for now.  Strict control of hypertension with systolic goal below 140 for first 24 hours and then below 160.  Continue ongoing stroke work-up.  No family available at the bedside for discussion. This patient is critically ill and at significant risk of neurological worsening, death and care requires constant monitoring of vital signs, hemodynamics,respiratory and cardiac monitoring, extensive review of multiple databases, frequent  neurological assessment, discussion with family, other specialists and medical decision making of high complexity.I have made any additions or clarifications directly to the above note.This critical care time does not reflect procedure time, or teaching time or supervisory time of PA/NP/Med Resident etc but could involve care discussion time.  I spent 30 minutes of neurocritical care time  in the care of  this patient.      08/08/2020, MD Medical Director [facility] Pager: [phone] 08/06/2020 4:45 PM : Low all things glucocorticoids ago yeah yeah sure if it   To contact Stroke Continuity provider, please refer to 07/26/2020. After hours, contact General Neurology

## 2020-08-07 NOTE — ED Provider Notes (Signed)
  Physical Exam  LMP 08/05/2020 (Exact Date)   Physical Exam  ED Course/Procedures     Procedures  MDM   Transfer from [facility].  Intracranial hemorrhage.  Decreased mental status but does arouse to stimulation and can still speak.  Admit to ICU with neurology.      Benjiman Core, MD 08/04/2020 [phone]

## 2020-08-07 NOTE — ED Notes (Signed)
Unable to perform modified NIHSS due to pt's inability to follow commands and no verbal response from pt

## 2020-08-07 NOTE — ED Notes (Signed)
Attempted contact to patient's family without answer from number in chart.

## 2020-08-07 NOTE — ED Notes (Signed)
Pt unable  To sign the mse

## 2020-08-07 NOTE — ED Notes (Signed)
Hold Keppra for pt to go to MRI per Dr. Fuller Plan at this time.  Pt transporting to MRI now.

## 2020-08-07 NOTE — ED Notes (Signed)
Spanish interpreter and tele neuro Dr. Imogene Burn at bedside.

## 2020-08-07 NOTE — ED Notes (Signed)
Unable to perform modified stroke due to pt's inability to follow commands or respond verbally.

## 2020-08-07 NOTE — Progress Notes (Signed)
Attempted EEG again pt not cooperative. Will need two or more techs for set up.

## 2020-08-07 NOTE — ED Triage Notes (Signed)
Pt transferred from [facility]  Ed. Transferred from there with a head bleed.  On arrival sleeping wakes with sternal rub .  She arrived by [facility].   The husband dropped her off at [place]  Around 2030.  The pt was unresponsive.  The husband left that ed and did not return.  The pt states that she has three children we do not know the ages.  She moves all her extremities.  Pupils equal and react aprox size 3.

## 2020-08-07 NOTE — ED Notes (Signed)
Patient transported to CT 

## 2020-08-07 NOTE — Consult Note (Addendum)
[facility] Tele[facility]urology Consult Services  Stat Consult  Date of Service:   08/05/2020 02:53:16  Diagnosis:     .  I63.9 - Cerebrovascular accident (CVA), unspecified mechanism (HCC)     .  I61.2 - Intracerebral hemorrhage in hemisphere, unspecified     .  I60.9 - Subarachnoid haemorrhage, unspecified  Impression: 40 yo F with h/o HTN and DM who presents with over 24 hours of R sided headache with nausea and vomiting and altered mental status. On exam, she was combative and withdrawn, sleepy. She was not willing to comply with examination or interview. NIHSS is thus unreliable. It does seem she has some L visual field deficit and encephalopathy but no weakness or numbness or ataxia. On CTH there is evidence of likely R temporal infarct with hemorrhages in that region including intraparenchymal, subarachnoid, and also small subdural hemorrhage in the R hemisphere. She is not a candidate for TPA or NIR. However, her condition is critical and is at risk for deterioration. I recommend frequent neuro checks, repeat CTH in 6-12 hours. Further stroke workup as below.  CT HEAD: Reviewed sizable area of R temporal edema with intraparenchymal hemorrhage, subarachnoid blood as well, and possible thrombosed vessels. Small R frontal subdural hemorrhage noted as well.  Our recommendations are outlined below.  Diagnostic Studies: Recommend MRI brain without contrast Routine MRA head without contrast and MRA Neck with contrast Repeat CT Head without contrast at 6-12 hours Transthoracic Echo with bubble study, if available  Laboratory Studies: Recommend Lipid panel Hemoglobin A1c  Nursing Recommendations: Telemetry, IV Fluids, avoid dextrose containing fluids, Maintain euglycemia Head of bed 30 degrees  Consultations: Recommend Speech therapy if failed dysphagia screen Physical therapy/Occupational therapy  DVT Prophylaxis: SCDs, Pneumatic  Compression  Disposition: Neurology will follow  Additional Recommendations: -avoid antiplatelets or antithrombotics -maintain normotension (no permissive HTN at this point).  -stat Neurosurgery consult. - frequent neuro checks as she could decompensate.  -Repeat CTH in 6-12 hours. check coag panel - Also load with Keppra 1g and continue 500mg  BID.     Metrics: [facility] Notification Time: 08/03/2020 02:51:20 Stamp Time: 07/21/2020 02:53:16 Callback Response Time: 07/16/2020 02:54:27   ----------------------------------------------------------------------------------------------------  Chief Complaint: headache, nausea/vomiting  History of Present Illness: Patient is a 40 year old Female.  64Y F Came in with 24 hrs of HAs and nausea and vomiting. Can move all extremities, but very sleepy, and is not wanting to participate in interview. She denies falling. She endorses a headache on the R. she refuses to answer any other questions. During the interview, she became combative and was pushing her way out of bed against RN who was trying to keep her from falling. Per the ED, she felt like she had some difficulty with L vision field. HCT- Large posterior right stroke    Past Medical History:     . Hypertension     . Diabetes Mellitus     . There is NO history of Stroke  Anticoagulant use:  No  Antiplatelet use: No  NIHSS may not be reliable due to: patient is combative and refuses to participate in exam, fighting RN to get out of bed. Examination: BP(104/48), Pulse(92), Blood Glucose(290) 1A: Level of Consciousness - Requires repeated stimulation to arouse + 2 1B: Ask Month and Age - 1 Question Right + 1 1C: Blink Eyes & Squeeze Hands - Performs 1 Task + 1 2: Test Horizontal Extraocular Movements - Normal + 0 3: Test Visual Fields - Partial Hemianopia + 1 4:  Test Facial Palsy (Use Grimace if Obtunded) - Normal symmetry + 0 5A: Test Left Arm Motor Drift - No Drift for  10 Seconds + 0 5B: Test Right Arm Motor Drift - No Drift for 10 Seconds + 0 6A: Test Left Leg Motor Drift - No Drift for 5 Seconds + 0 6B: Test Right Leg Motor Drift - No Drift for 5 Seconds + 0 7: Test Limb Ataxia (FNF/Heel-Shin) - No Ataxia + 0 8: Test Sensation - Normal; No sensory loss + 0 9: Test Language/Aphasia - Normal; No aphasia + 0 10: Test Dysarthria - Normal + 0 11: Test Extinction/Inattention - No abnormality + 0  NIHSS Score: 5     Patient / Family was informed the Neurology Consult would occur via TeleHealth consult by way of interactive audio and video telecommunications and consented to receiving care in this manner.  Patient is being evaluated for possible acute neurologic impairment and high probability of imminent or life - threatening deterioration.I spent total of 25 minutes providing care to this patient, including time for face to face visit via telemedicine, review of medical records, imaging studies and discussion of findings with providers, the patient and / or family.   Dr Lynnda Child   [facility] [phone]  Case [id number]

## 2020-08-07 NOTE — ED Notes (Addendum)
Unable to get in touch with patient contacts listed in chart at this time to update about patient's transfer to [facility].  Pt unable to sign transfer consent d/t altered mental status.

## 2020-08-07 NOTE — ED Notes (Signed)
Spanish interpreter at bedside at this time awaiting neurologist to arrive

## 2020-08-07 NOTE — Progress Notes (Signed)
PT Cancellation Note  Patient Details Name: Regina Morris MRN: [id number] DOB: [birth year]   Cancelled Treatment:    Reason Eval/Treat Not Completed: Medical issues which prohibited therapy Pt awaiting transfer to ICU from ED. Will hold until pt medically appropriate.   Cindee Salt, DPT  Acute Rehabilitation Services  Pager: [phone] Office: [phone]    [name] 07/28/2020, 10:42 AM

## 2020-08-08 ENCOUNTER — Inpatient Hospital Stay (HOSPITAL_COMMUNITY): Payer: Self-pay

## 2020-08-08 DIAGNOSIS — I63511 Cerebral infarction due to unspecified occlusion or stenosis of right middle cerebral artery: Secondary | ICD-10-CM

## 2020-08-08 LAB — GLUCOSE, CAPILLARY
Glucose-Capillary: 164 mg/dL — ABNORMAL HIGH (ref 70–99)
Glucose-Capillary: 165 mg/dL — ABNORMAL HIGH (ref 70–99)
Glucose-Capillary: 178 mg/dL — ABNORMAL HIGH (ref 70–99)
Glucose-Capillary: 188 mg/dL — ABNORMAL HIGH (ref 70–99)
Glucose-Capillary: 195 mg/dL — ABNORMAL HIGH (ref 70–99)
Glucose-Capillary: 208 mg/dL — ABNORMAL HIGH (ref 70–99)
Glucose-Capillary: 220 mg/dL — ABNORMAL HIGH (ref 70–99)

## 2020-08-08 LAB — PHOSPHORUS: Phosphorus: 1.9 mg/dL — ABNORMAL LOW (ref 2.5–4.6)

## 2020-08-08 LAB — BASIC METABOLIC PANEL
Anion gap: 10 (ref 5–15)
BUN: 10 mg/dL (ref 6–20)
CO2: 20 mmol/L — ABNORMAL LOW (ref 22–32)
Calcium: 8.2 mg/dL — ABNORMAL LOW (ref 8.9–10.3)
Chloride: 113 mmol/L — ABNORMAL HIGH (ref 98–111)
Creatinine, Ser: 0.53 mg/dL (ref 0.44–1.00)
GFR, Estimated: >60 mL/min (ref >60)
Glucose, Bld: 194 mg/dL — ABNORMAL HIGH (ref 70–99)
Potassium: 3.6 mmol/L (ref 3.5–5.1)
Sodium: 143 mmol/L (ref 135–145)

## 2020-08-08 LAB — ANTI-DNA ANTIBODY, DOUBLE-STRANDED: ds DNA Ab: <1 IU/mL (ref 0–9)

## 2020-08-08 LAB — CBC
HCT: 32.5 % — ABNORMAL LOW (ref 36.0–46.0)
Hemoglobin: 8.5 g/dL — ABNORMAL LOW (ref 12.0–15.0)
MCH: 16.1 pg — ABNORMAL LOW (ref 26.0–34.0)
MCHC: 26.2 g/dL — ABNORMAL LOW (ref 30.0–36.0)
MCV: 61.7 fL — ABNORMAL LOW (ref 80.0–100.0)
Platelets: 549 10*3/uL — ABNORMAL HIGH (ref 150–400)
RBC: 5.27 MIL/uL — ABNORMAL HIGH (ref 3.87–5.11)
RDW: 19.9 % — ABNORMAL HIGH (ref 11.5–15.5)
WBC: 17.6 10*3/uL — ABNORMAL HIGH (ref 4.0–10.5)
nRBC: 0 % (ref 0.0–0.2)

## 2020-08-08 LAB — PROTEIN C ACTIVITY: Protein C Activity: 167 % (ref 73–180)

## 2020-08-08 LAB — PROTEIN S ACTIVITY: Protein S Activity: 80 % (ref 63–140)

## 2020-08-08 LAB — SODIUM
Sodium: 148 mmol/L — ABNORMAL HIGH (ref 135–145)
Sodium: 150 mmol/L — ABNORMAL HIGH (ref 135–145)
Sodium: 149 mmol/L — ABNORMAL HIGH (ref 135–145)

## 2020-08-08 LAB — MAGNESIUM: Magnesium: 2 mg/dL (ref 1.7–2.4)

## 2020-08-08 IMAGING — DX DG ABD PORTABLE 1V
1 series · 1 of 1 positions shown · non-contrast
Comparison: Earlier radiograph dated [DATE]

CLINICAL DATA: 39-year-old female status post feeding tube
placement.

EXAM:
PORTABLE ABDOMEN - 1 VIEW

[abdomen supine]
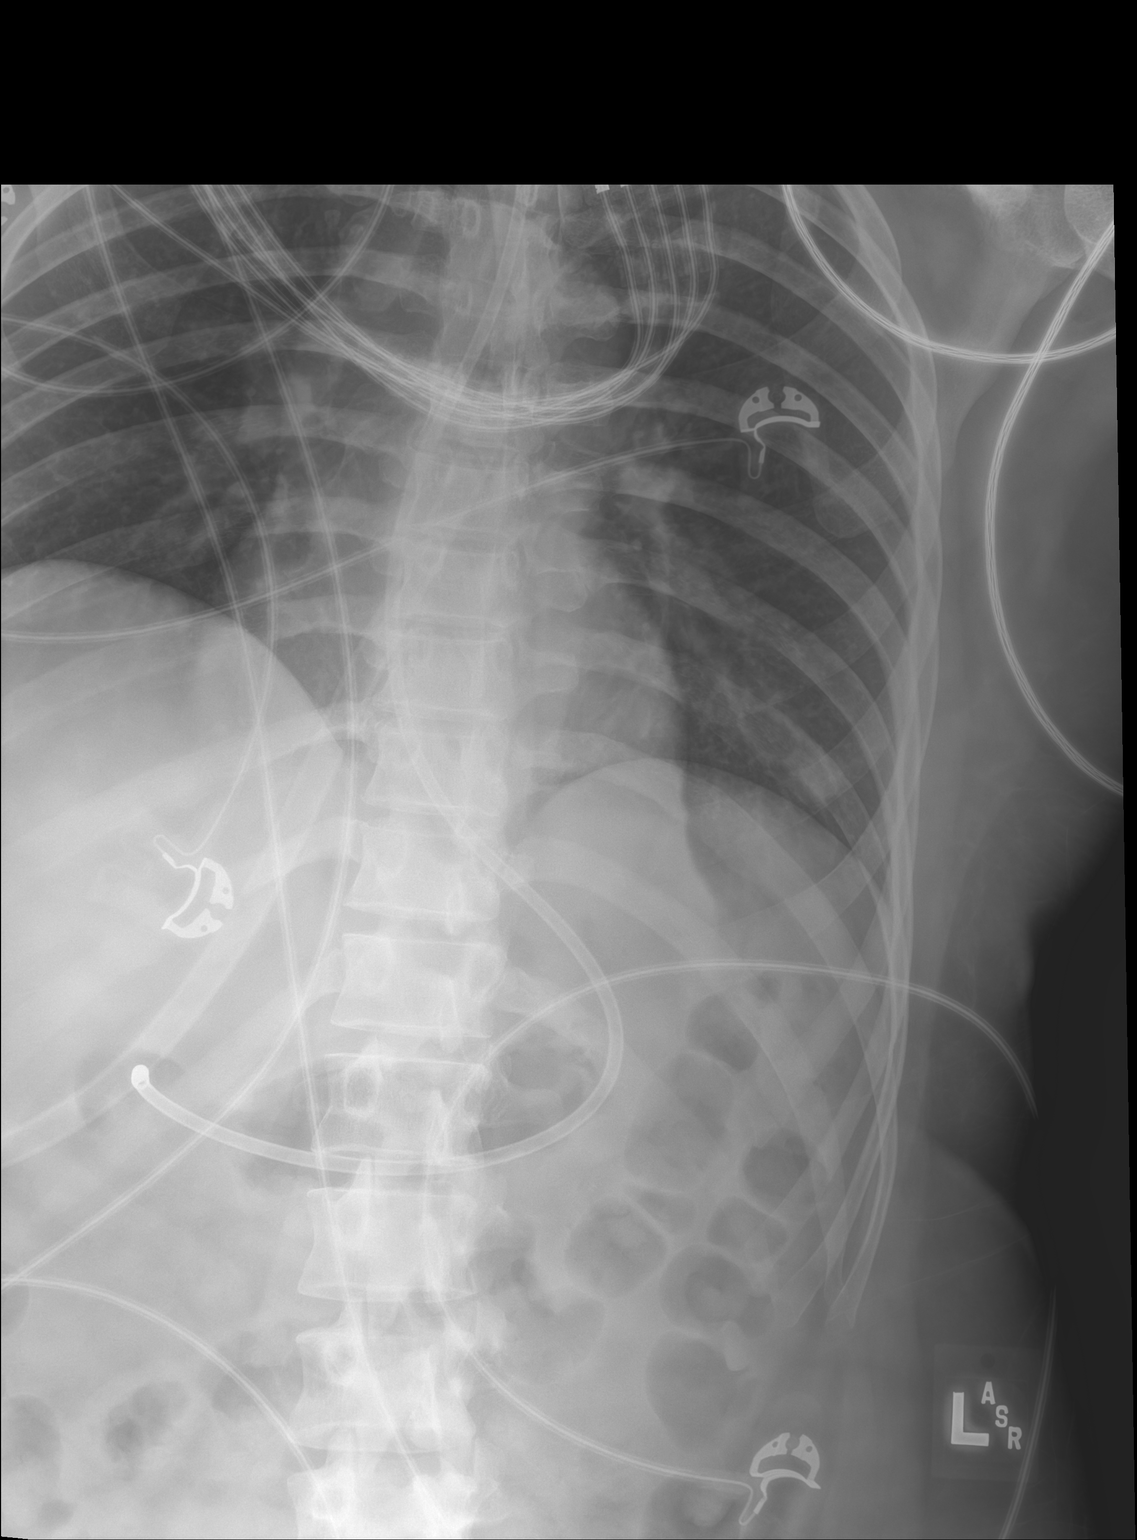

[1 of 1 positions shown; findings below may reference images not displayed]

FINDINGS: Feeding tube with tip likely in the region of the duodenal bulb. No
other interval change.
IMPRESSION: Feeding tube with tip likely in the region of the duodenal bulb.

## 2020-08-08 IMAGING — CT CT HEAD W/O CM
4 series · 17 of 47 positions shown, 19 images · non-contrast
Comparison: [DATE] at [DATE] p.m.

CLINICAL DATA: Acute stroke, follow-up evaluation

EXAM:
CT HEAD WITHOUT CONTRAST
TECHNIQUE: Contiguous axial images were obtained from the base of the skull
through the vertex without intravenous contrast.

[Series 2: head without · axial · non-contrast · 0.46mm/px · z∈[+1138,+1272]mm · 7 of 37 slices shown, 9 images]
[im 5/37  brain]
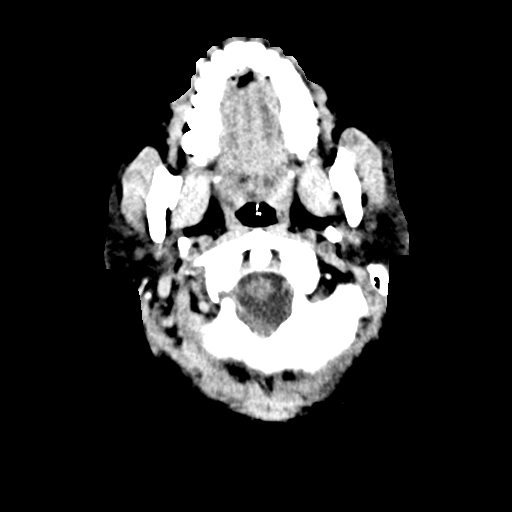
[im 5/37  bone]
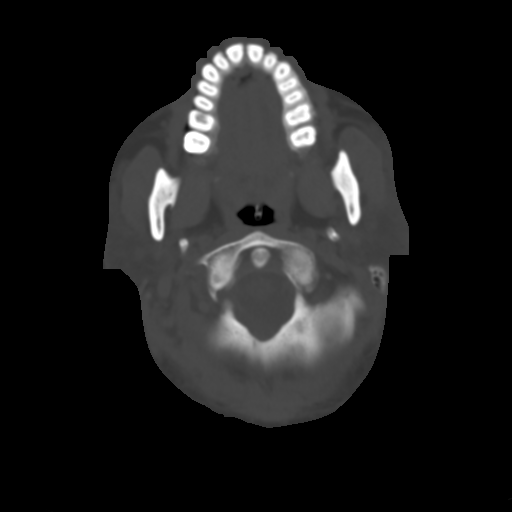
[im 10/37  brain]
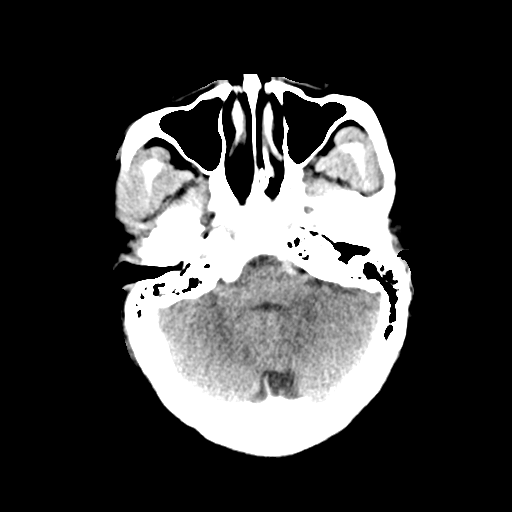
[im 14/37  brain]
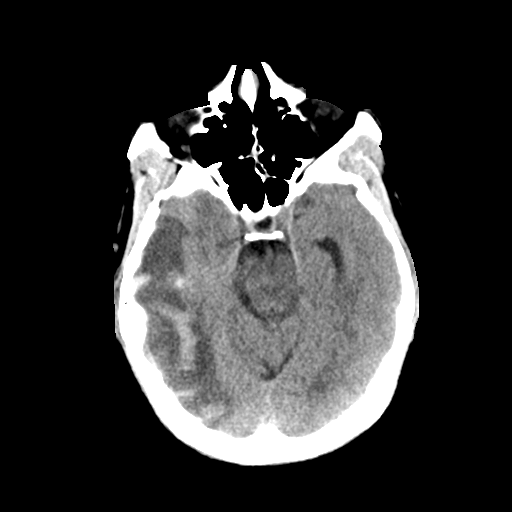
[im 19/37  brain]
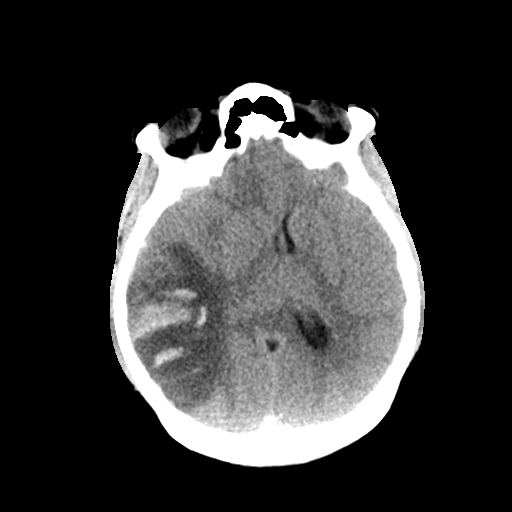
[im 23/37  brain]
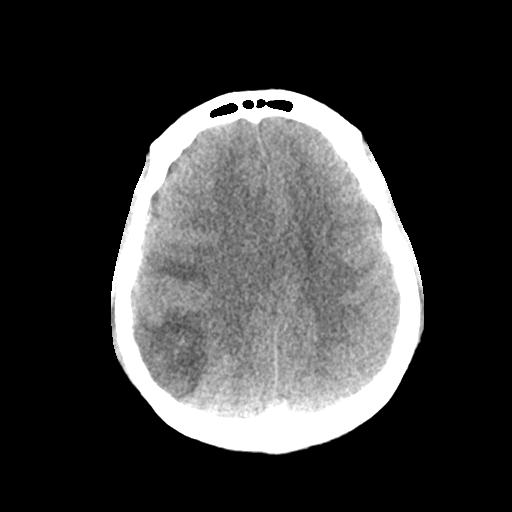
[im 23/37  bone]
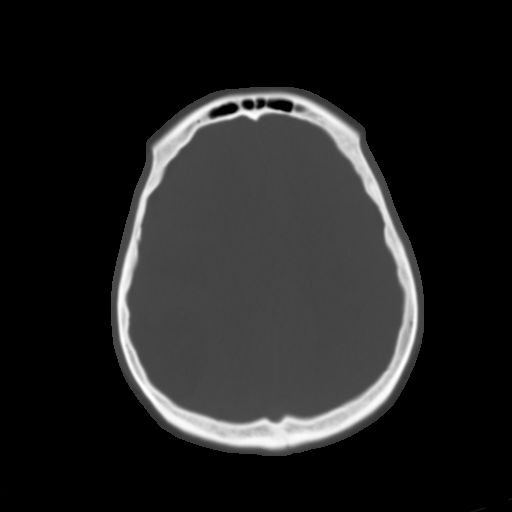
[im 28/37  brain]
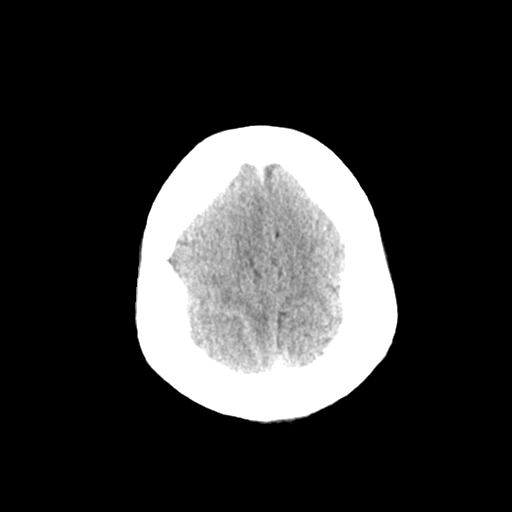
[im 32/37  brain]
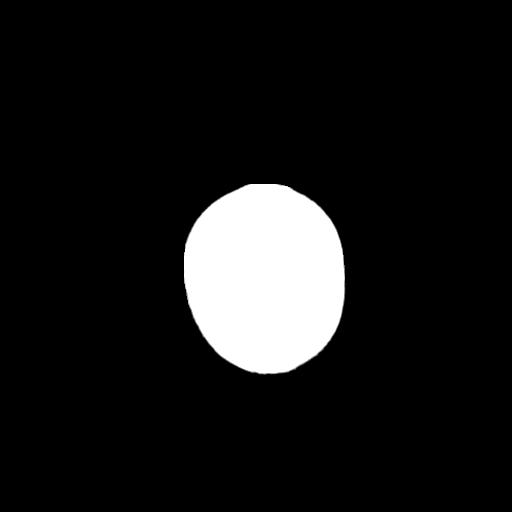

[Series 3: head bone · axial · 0.46mm/px · z∈[+1136,+1200]mm · 4 of 93 slices shown]
[im 10/93  bone]
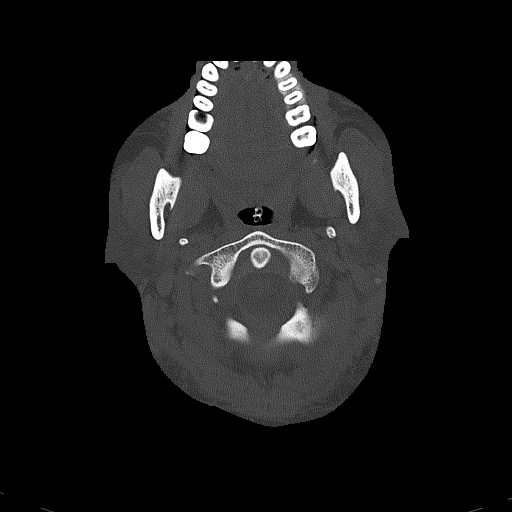
[im 19/93  bone]
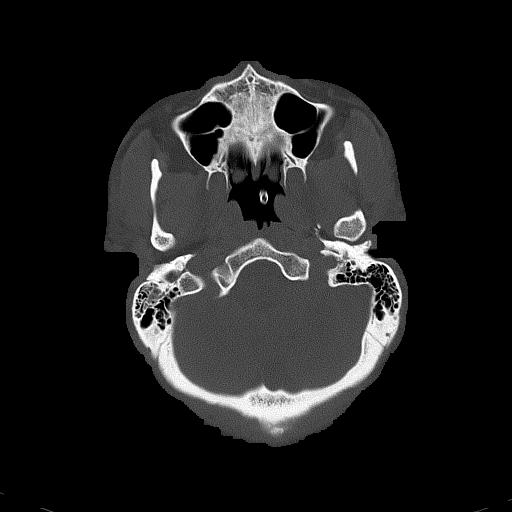
[im 28/93  bone]
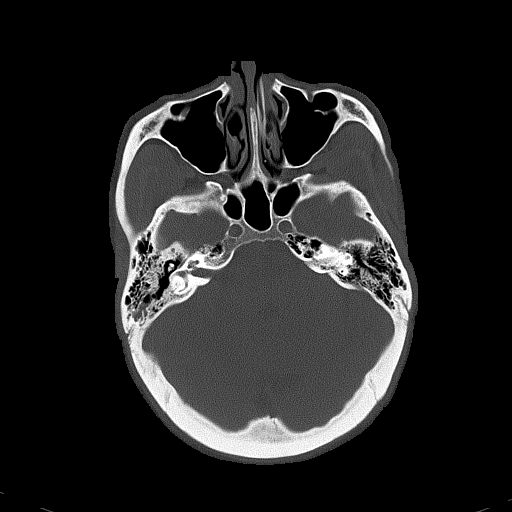
[im 42/93  bone]
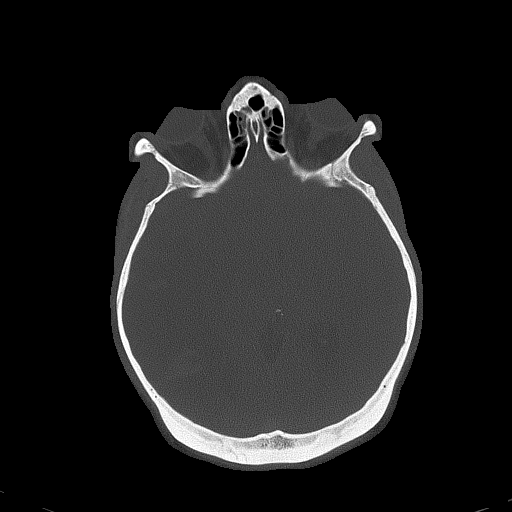

[Series 4: head without cor · coronal · non-contrast · 0.31mm/px · 3 of 67 slices shown]
[im 23/67  brain]
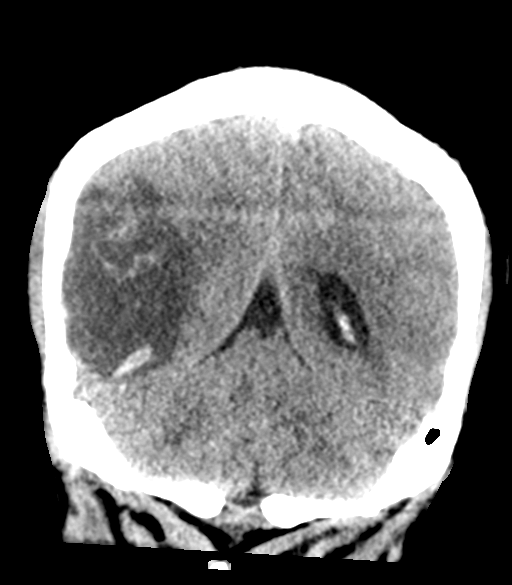
[im 30/67  brain]
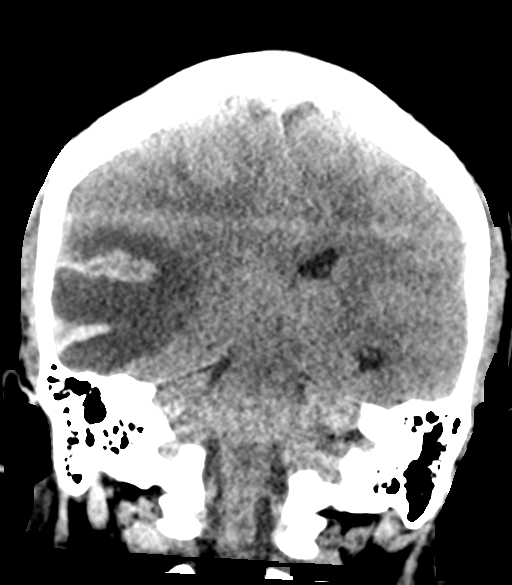
[im 37/67  brain]
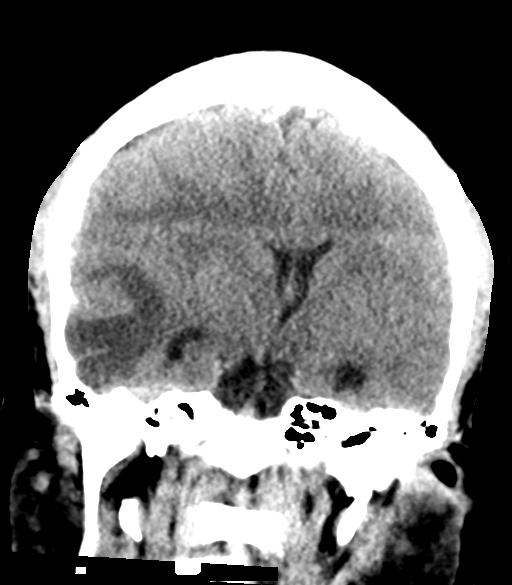

[Series 5: head without sag · sagittal · non-contrast · 0.36mm/px · 3 of 66 slices shown]
[im 22/66  brain]
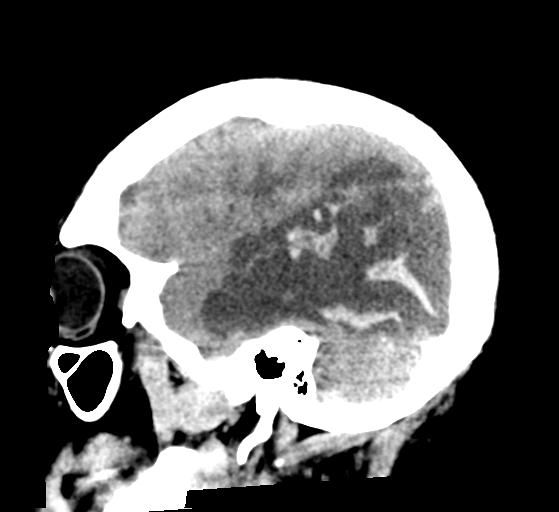
[im 33/66  brain]
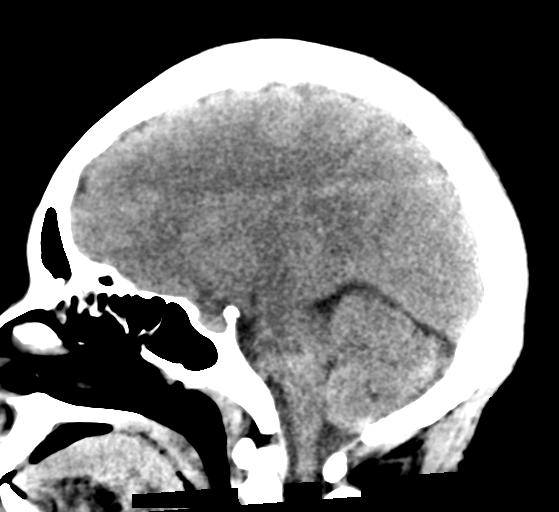
[im 44/66  brain]
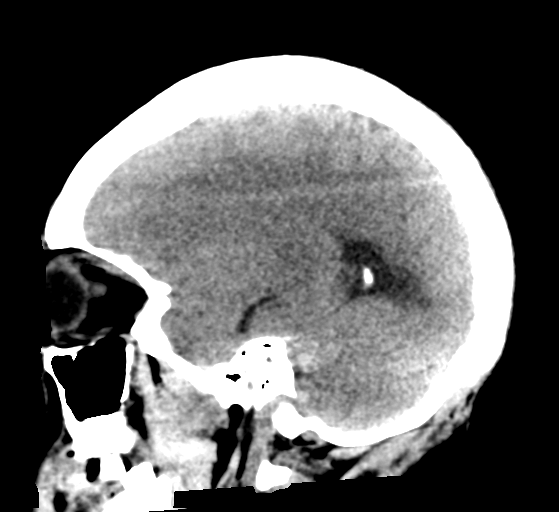

[17 of 47 positions shown; findings below may reference images not displayed]

FINDINGS: Brain: There is a grossly stable right MCA territory hemorrhagic
infarct, with continued edema and internal hemorrhage unchanged
since prior study. There is persistent mass effect, with leftward
midline shift measuring 9 mm at the level of the septum pellucidum
unchanged since prior study. Continued effacement of the right
lateral ventricle. Stable mild dilation of the temporal and
occipital horns of the left lateral ventricle. Basilar cisterns
remain patent.

Vascular: No hyperdense vessel or unexpected calcification.

Skull: Normal. Negative for fracture or focal lesion.

Sinuses/Orbits: Minimal fluid within the right mastoid air cells.
Mild mucoperiosteal thickening in the ethmoid air cells unchanged.

Other: None.
IMPRESSION: 1. Grossly stable hemorrhagic right MCA territory infarct, with
similar mass effect and leftward midline shift unchanged since prior
exam.

## 2020-08-08 IMAGING — CT CT HEAD W/O CM
2 series · 15 of 30 positions shown, 17 images · non-contrast
Comparison: Prior CT from [DATE] at [DATE] p.m.

CLINICAL DATA: Follow-up examination for stroke.

EXAM:
CT HEAD WITHOUT CONTRAST
TECHNIQUE: Contiguous axial images were obtained from the base of the skull
through the vertex without intravenous contrast.

[Series 2: head wo · axial · 0.42mm/px · z∈[-151,-1]mm · 7 of 40 slices shown, 9 images]
[im 5/40  brain]
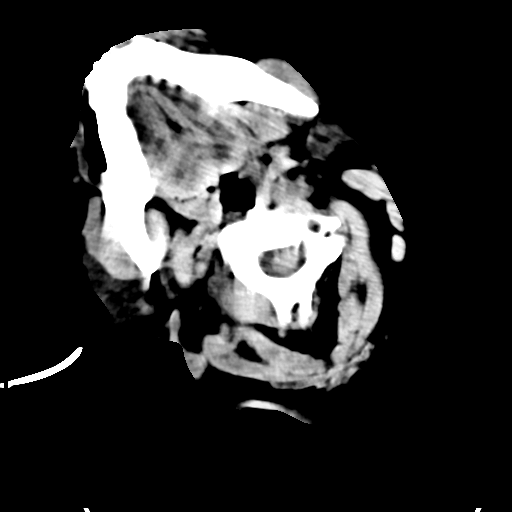
[im 5/40  bone]
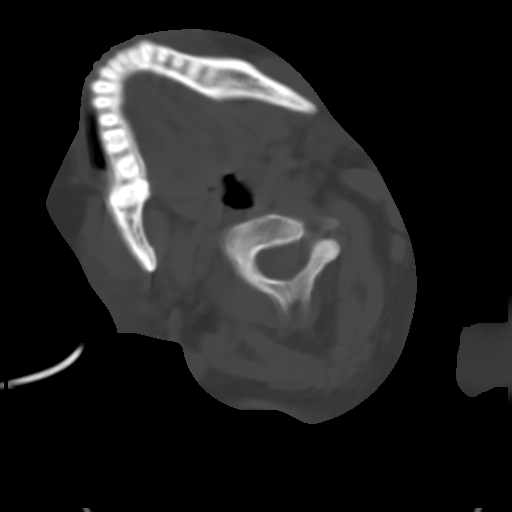
[im 10/40  brain]
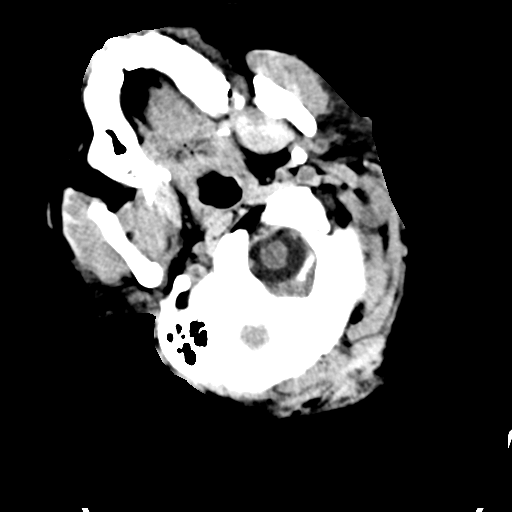
[im 15/40  brain]
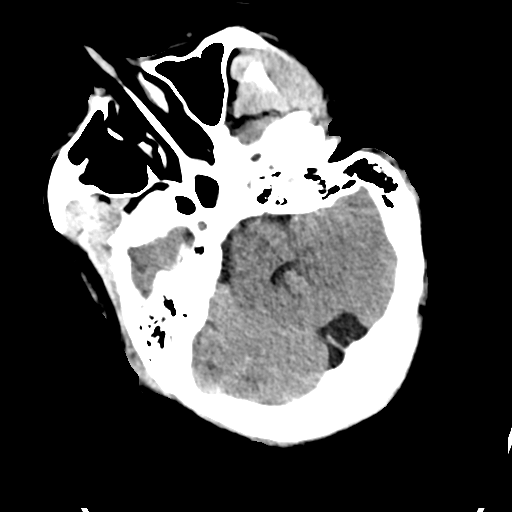
[im 20/40  brain]
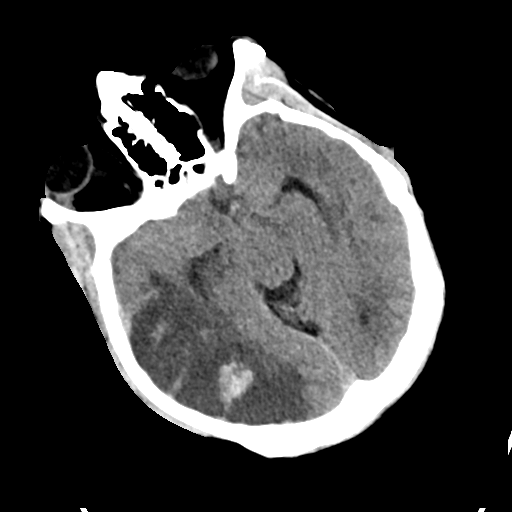
[im 25/40  brain]
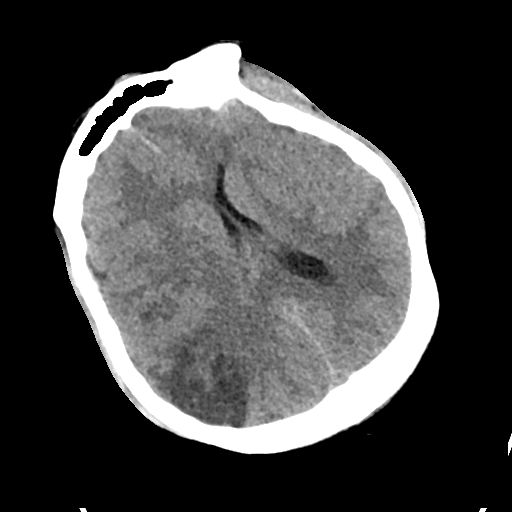
[im 25/40  bone]
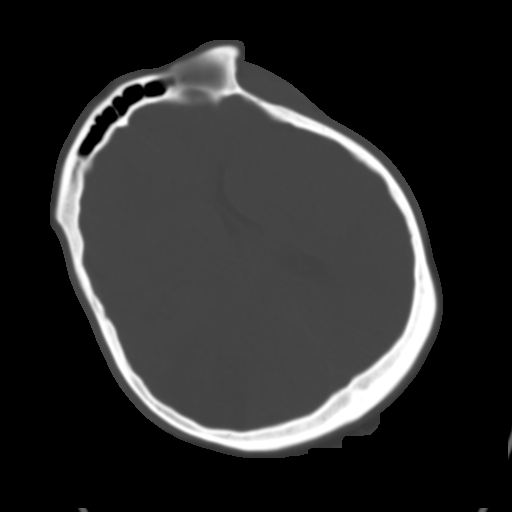
[im 30/40  brain]
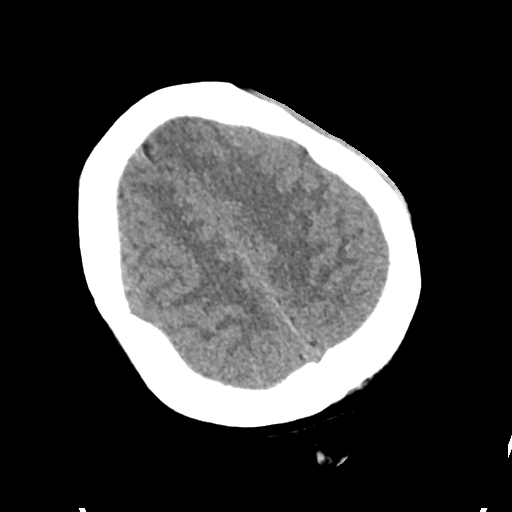
[im 35/40  brain]
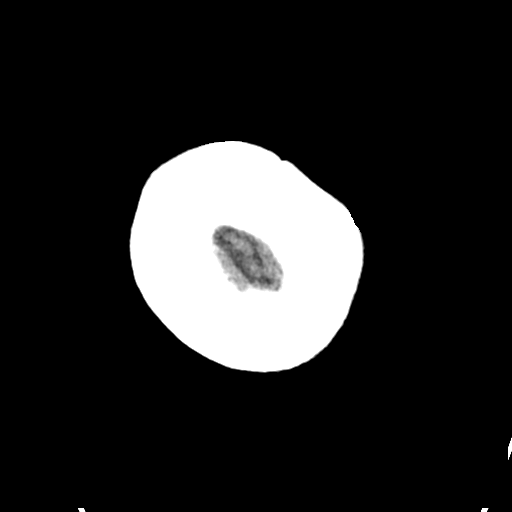

[Series 3: head bone · axial · 0.42mm/px · z∈[-153,+5]mm · 8 of 99 slices shown]
[im 10/99  bone]
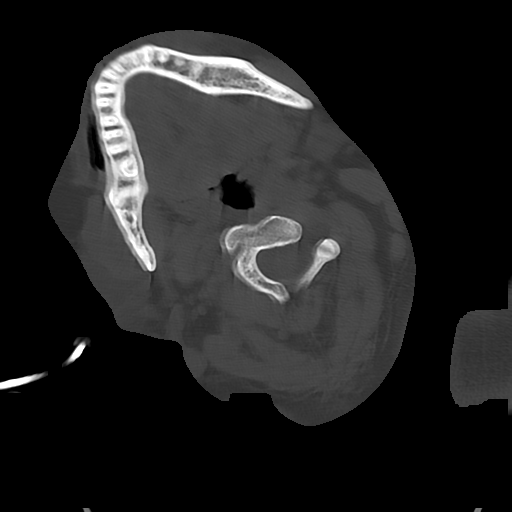
[im 20/99  bone]
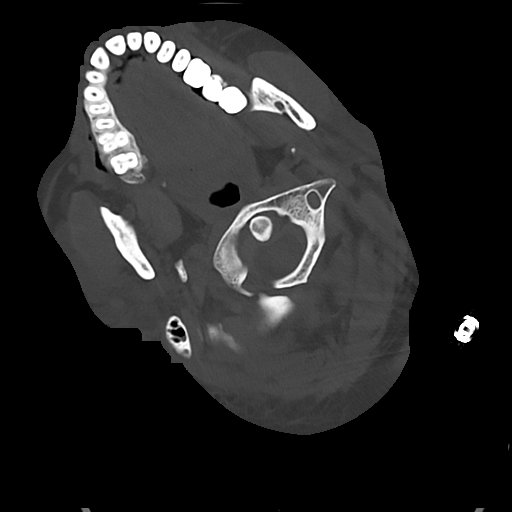
[im 30/99  bone]
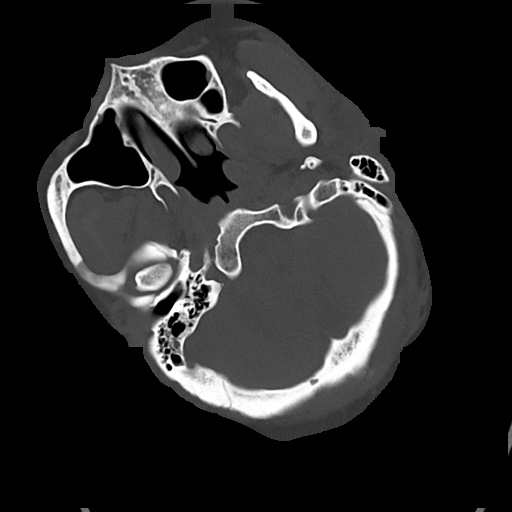
[im 45/99  bone]
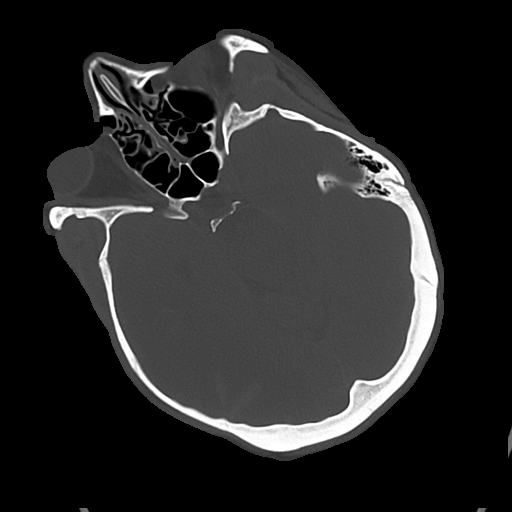
[im 54/99  bone]
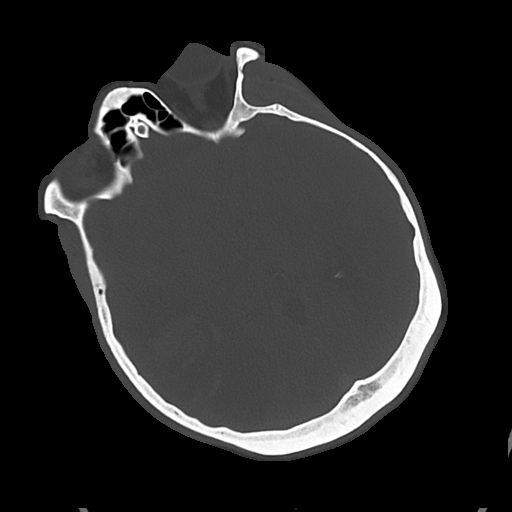
[im 69/99  bone]
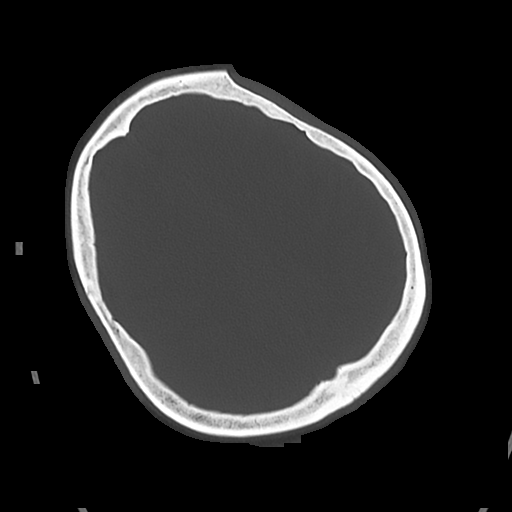
[im 79/99  bone]
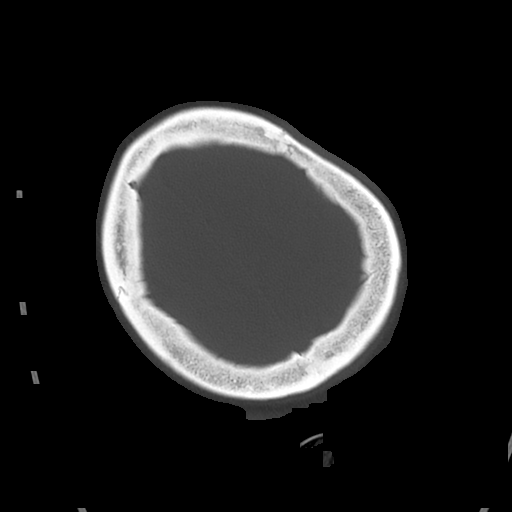
[im 89/99  bone]
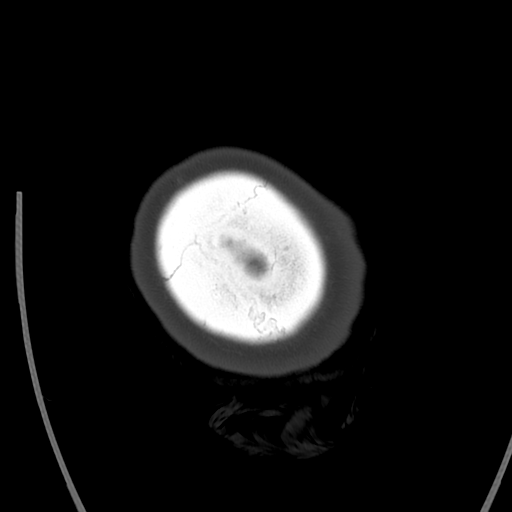

[15 of 30 positions shown; findings below may reference images not displayed]

FINDINGS: Brain: Continued interval evolution of right MCA distribution
infarct, similar in size with slightly more well-defined and
hypodense as compared to previous exam. Internal hemorrhage within
the area of infarction is relatively similar. Associated mass effect
with up to 8 mm of right-to-left shift is not significantly changed.
Ventricular size and morphology is also relatively similar without
hydrocephalus or trapping. Basilar cisterns remain patent.

Probable extra-axial extension of hemorrhage with trace blood seen
overlying the right frontal convexity (series 4, image 25). This was
also present on prior exam in retrospect. This measures no more than
2 mm in maximal thickness without significant mass effect.

No other new infarct or hemorrhage. Remainder the brain is otherwise
stable and negative.

Vascular: No hyperdense vessel.

Skull: Scalp soft tissues demonstrate no acute finding. Calvarium
intact.

Sinuses/Orbits: Globes orbital soft tissues within normal limits.
Scattered mucosal thickening noted within the ethmoidal air cells.
Small right mastoid effusion noted.

Other: None.
IMPRESSION: 1. Continued interval evolution of hemorrhagic right MCA
distribution infarct, similar in size but slightly more well-defined
and hypodense as compared to previous exam. Associated mass effect
with up to 8 mm of right-to-left shift is not significantly changed.
2. Probable extra-axial extension of hemorrhage with trace blood
overlying the right frontal convexity, similar to previous. No
significant mass effect.
3. No other new acute intracranial abnormality.

## 2020-08-08 IMAGING — CT CT HEAD W/O CM
4 series · 15 of 47 positions shown, 17 images · non-contrast
Comparison: Prior CT from earlier the same day performed at [DATE]
a.m.

CLINICAL DATA: Follow-up examination for acute stroke.

EXAM:
CT HEAD WITHOUT CONTRAST
TECHNIQUE: Contiguous axial images were obtained from the base of the skull
through the vertex without intravenous contrast.

[Series 3: head wo · axial · 0.44mm/px · z∈[-158,-33]mm · 7 of 35 slices shown, 9 images]
[im 5/35  brain]
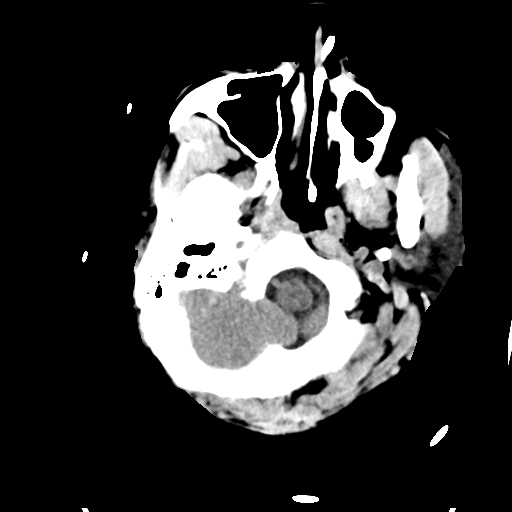
[im 5/35  bone]
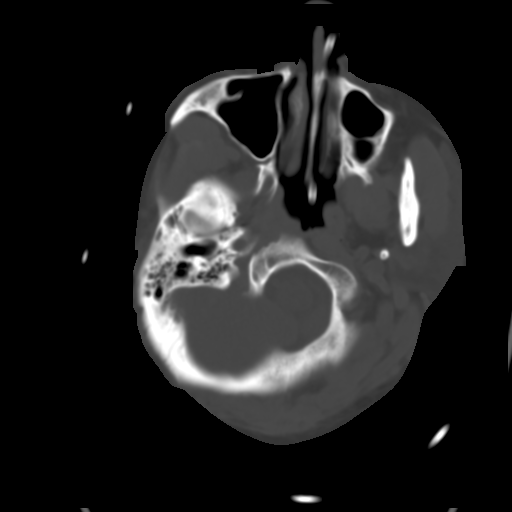
[im 9/35  brain]
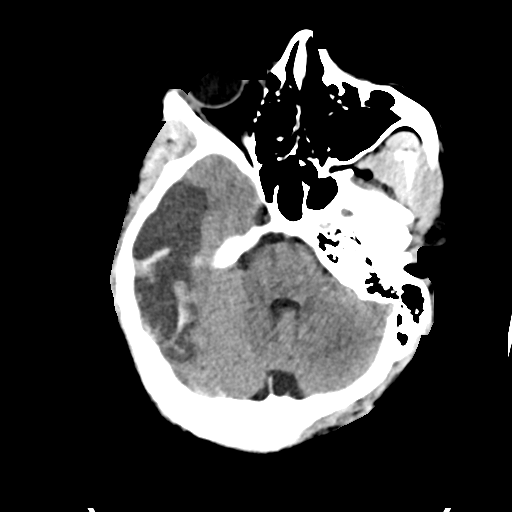
[im 13/35  brain]
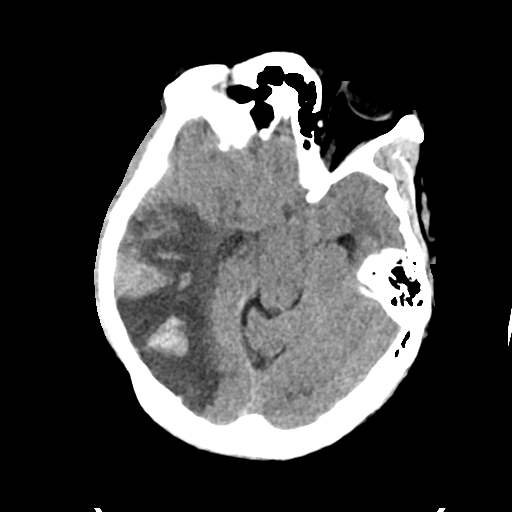
[im 18/35  brain]
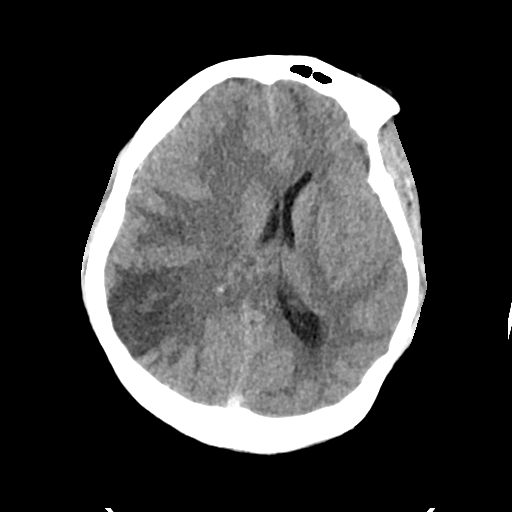
[im 22/35  brain]
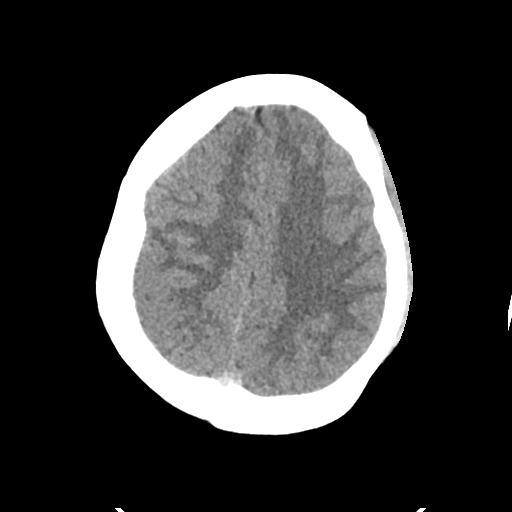
[im 22/35  bone]
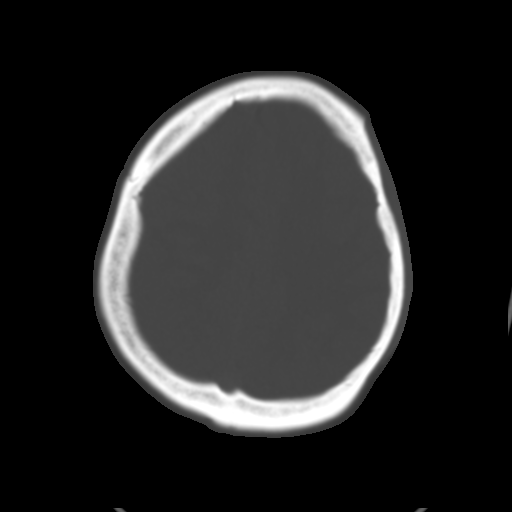
[im 26/35  brain]
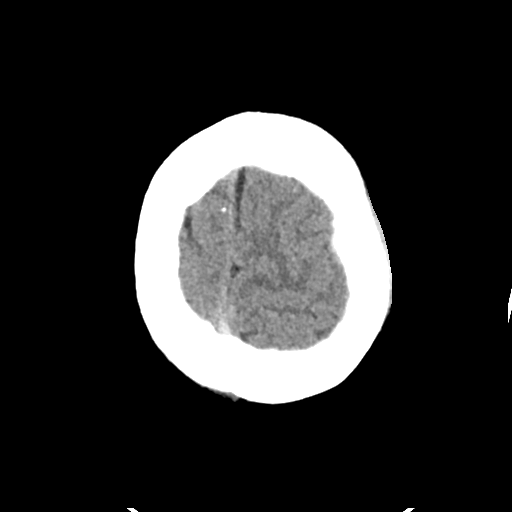
[im 30/35  brain]
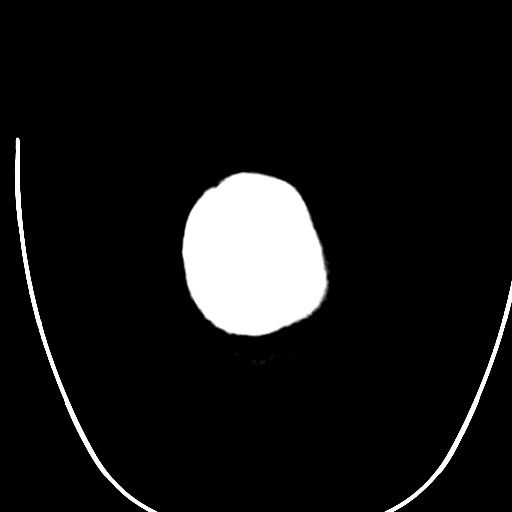

[Series 4: head bone · axial · 0.44mm/px · z∈[-162,-144]mm · 2 of 86 slices shown]
[im 9/86  bone]
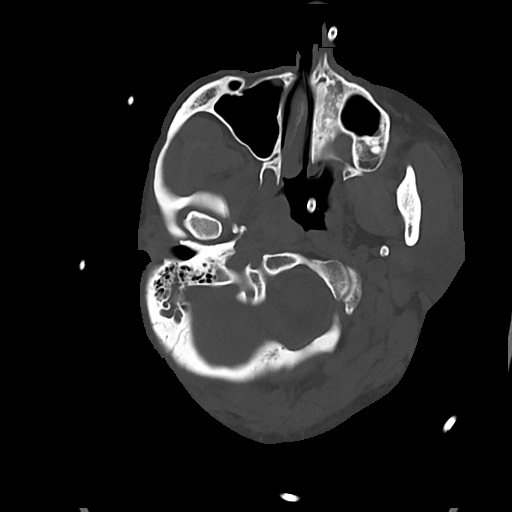
[im 18/86  bone]
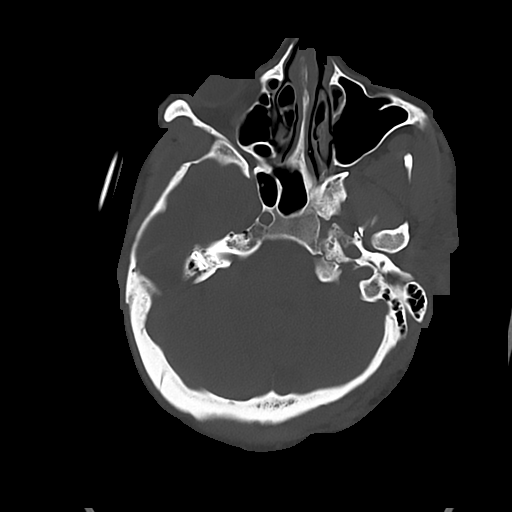

[Series 5: cor soft · coronal · 0.35mm/px · 3 of 75 slices shown]
[im 28/75  brain]
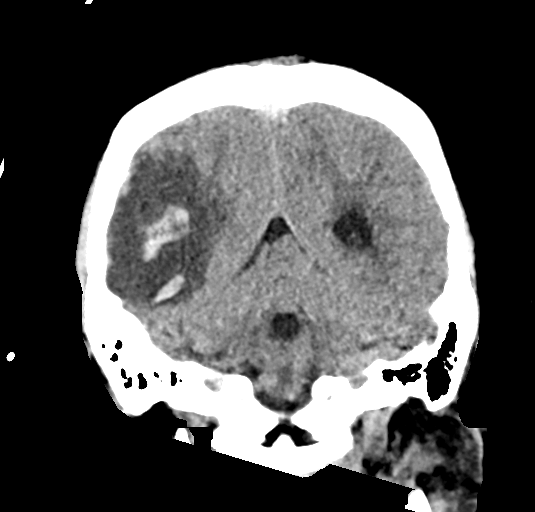
[im 34/75  brain]
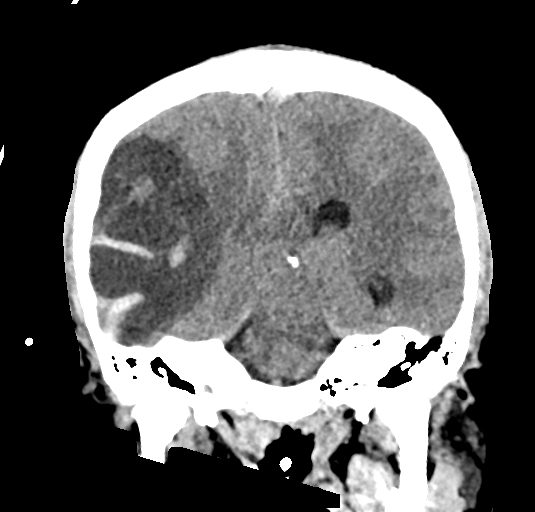
[im 41/75  brain]
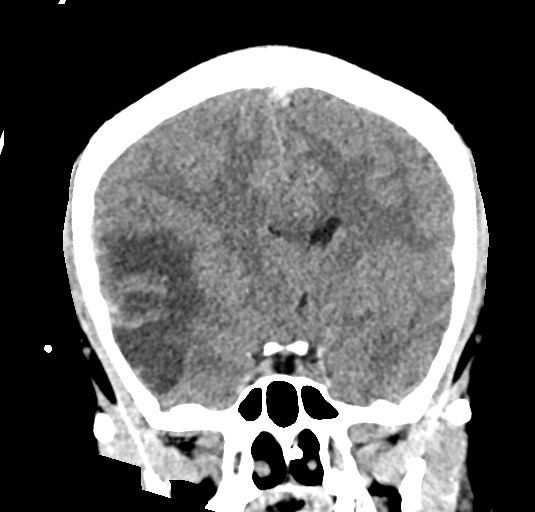

[Series 6: sag soft · sagittal · 0.35mm/px · 3 of 63 slices shown]
[im 27/63  brain]
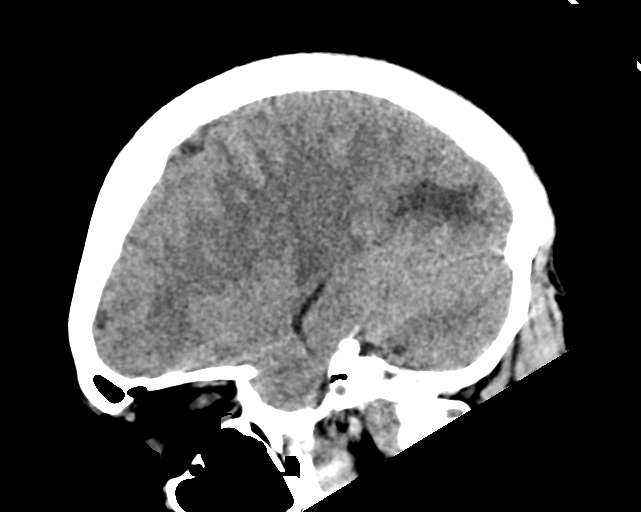
[im 32/63  brain]
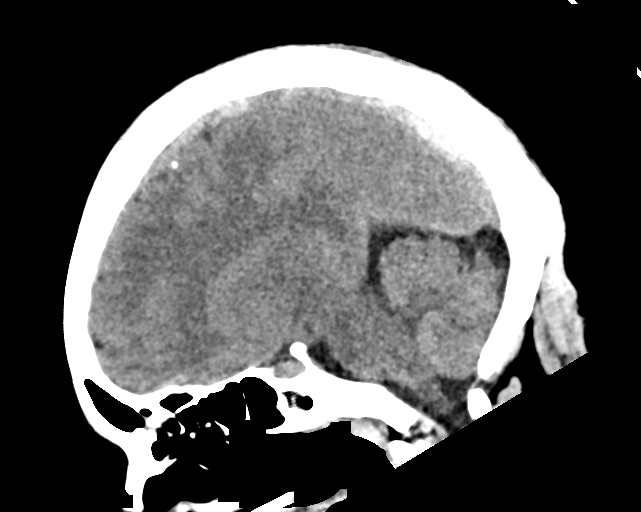
[im 36/63  brain]
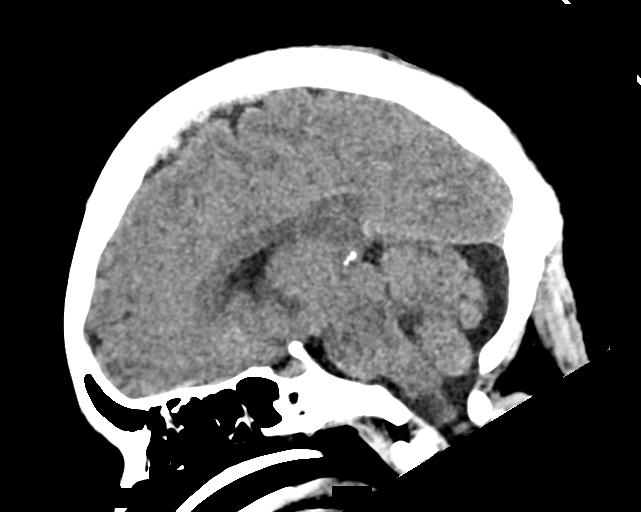

[15 of 47 positions shown; findings below may reference images not displayed]

FINDINGS: Brain: Continued interval evolution of right MCA distribution
infarct, overall relatively similar in size as compared to previous
exam. Associated hemorrhagic transformation with internal
hemorrhage, relatively similar. No evidence for interval bleeding.
Associated mass effect has slightly worsened, with minimally
increased right-to-left shift now measuring up to 9 mm, previously 8
mm. Additionally, there is slightly increased dilatation of the
atrium and temporal horn of the left lateral ventricle as compared
to previous trace periventricular hypodensity about the left atrium
suggest a degree of transependymal flow of CSF, new and/or more
conspicuous as compared to previous exam (series 3, image 16).
Basilar cisterns are crowded but remain patent. No transtentorial
herniation.

Trace extra-axial extension of blood products overlying the right
frontal convexity again noted, measuring no more than 2-3 mm in
maximal thickness, relatively unchanged. No significant mass effect.

No other new acute large vessel territory infarct or hemorrhage.
Remainder the brain otherwise unremarkable.

Vascular: No hyperdense vessel.

Skull: Scalp soft tissues and calvarium demonstrate no new
abnormality.

Sinuses/Orbits: Globes and orbital soft tissues within normal
limits. Nasogastric tube in place. Paranasal sinuses are largely
clear. Small right mastoid effusion noted.

Other: None.
IMPRESSION: 1. Continued interval evolution of hemorrhagic right MCA
distribution infarct, overall relatively similar in size as compared
to previous exam. Associated mass effect has slightly worsened, with
minimally increased right-to-left midline shift now measuring up to
9 mm, previously 8 mm.
2. Slightly increased dilatation of the atrium and temporal horn of
the left lateral ventricle as compared to previous. Mild
periventricular hypodensity about the left atrium suggests a degree
of transependymal flow of CSF.
3. No other new acute intracranial abnormality.

## 2020-08-08 IMAGING — DX DG ABD PORTABLE 1V
1 series · 1 of 1 positions shown · non-contrast
Comparison: CT [DATE]

CLINICAL DATA: Feeding tube placement

EXAM:
PORTABLE ABDOMEN - 1 VIEW

[abdomen kub]
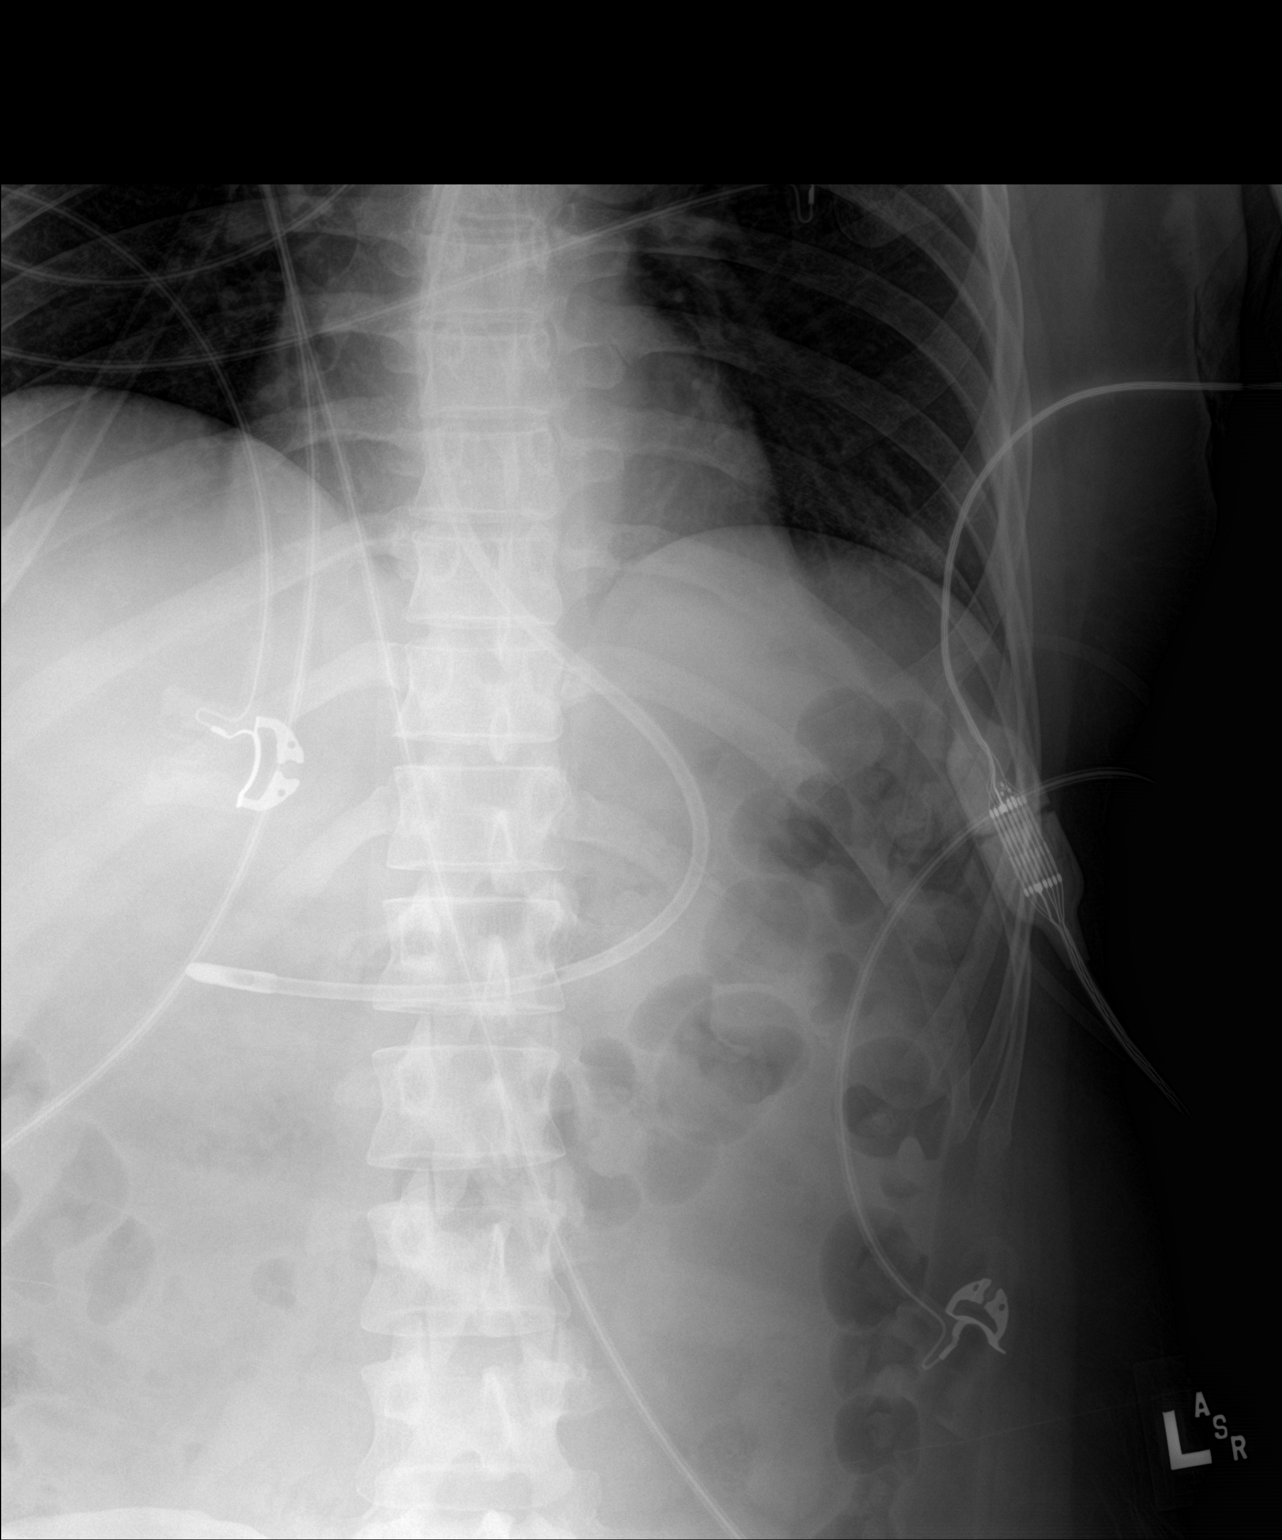

[1 of 1 positions shown; findings below may reference images not displayed]

FINDINGS: Tip of a transesophageal feeding tube terminates to the right of
midline, near the gastric antrum/duodenal bulb. Lung bases are
clear. Bowel gas pattern is unremarkable. No acute or worrisome
osseous or other soft tissue abnormalities. Telemetry leads overlie
the abdomen and chest.
IMPRESSION: Feeding tube tip terminates near the gastric antrum/duodenal bulb.

## 2020-08-08 MED ORDER — JEVITY 1.5 CAL/FIBER PO LIQD
1000.0000 mL | ORAL | Status: DC
  Administered 2020-08-08 – 2020-08-12 (×3): 1000 mL
  Filled 2020-08-08 (×9): qty 1000

## 2020-08-08 MED ORDER — MANNITOL 20 % IV SOLN
75.0000 g | Freq: Once | Status: AC
  Administered 2020-08-08: 75 g via INTRAVENOUS
  Filled 2020-08-08 (×4): qty 375

## 2020-08-08 MED ORDER — HYDRALAZINE HCL 20 MG/ML IJ SOLN: 10.0000 mg | Freq: Four times a day (QID) | INTRAMUSCULAR | Status: DC | PRN

## 2020-08-08 MED ORDER — POTASSIUM PHOSPHATES 15 MMOLE/5ML IV SOLN
30.0000 mmol | Freq: Once | INTRAVENOUS | Status: AC
  Administered 2020-08-08: 30 mmol via INTRAVENOUS
  Filled 2020-08-08: qty 10

## 2020-08-08 MED ORDER — HYDRALAZINE HCL 20 MG/ML IJ SOLN
10.0000 mg | Freq: Four times a day (QID) | INTRAMUSCULAR | Status: DC | PRN
  Administered 2020-08-09: 10 mg via INTRAVENOUS
  Filled 2020-08-08: qty 1

## 2020-08-08 MED ORDER — PROSOURCE TF PO LIQD
45.0000 mL | Freq: Every day | ORAL | Status: DC
  Administered 2020-08-08 – 2020-08-14 (×6): 45 mL
  Filled 2020-08-08 (×6): qty 45

## 2020-08-08 MED ORDER — VITAL HIGH PROTEIN PO LIQD
1000.0000 mL | ORAL | Status: DC
Start: 2020-08-08 — End: 2020-08-08

## 2020-08-08 MED ORDER — PROSOURCE TF PO LIQD: 45.0000 mL | Freq: Two times a day (BID) | ORAL | Status: DC

## 2020-08-08 NOTE — Progress Notes (Signed)
EEG Completed; Results Pending  

## 2020-08-08 NOTE — Progress Notes (Signed)
Notified Dr Amada Jupiter of patients changes in assessment including pupillary change and heart rate changes. Stat head CT ordered.

## 2020-08-08 NOTE — Progress Notes (Signed)
Dr Pearlean Brownie notified of pt HR dropping into the 40's more frequently; encouraged to call Neuro hospitalist if exam changes. Also to d/c any beta blockers. Will continue to monitor closely.

## 2020-08-08 NOTE — Progress Notes (Addendum)
Brief Neuro Update:  Reviewed CTH without contrast, her midline shift continues to worsen and is now ~45mm. The left lateral ventricle also appears slightly larger compared to prior imaging.  On exam, her R pupil is larger than the left now but both are reactive albeit R appears a little sluggish. Continues to be somnolent, still moves all extremities to noxious stimuli.  Recs: - Head of bed above 30 - Sodium ss at 150, continue Hypertonic saline - Mannitol 20%, 1g/kg for a total of 75g ordered and pharmacy notified - Neurosurgery team consulted STAT. Recommended close monitoring for now. - Repeat CTH ordered closer to midnight. - Q1Hour pupil checks - Neurosurgery team recommended intubation. I think she is still protecting her airway. In addition, we will lose a significant part of exam if she is intubated right now. Will monitor respiratory and airway closely.  Erick Blinks [facility] Pager Number [phone]

## 2020-08-08 NOTE — Evaluation (Signed)
Clinical/Bedside Swallow Evaluation Patient Details  Name: Regina Morris MRN: [id number] Date of Birth: 08-30-1980  Today's Date: 08/08/2020 Time: SLP Start Time (ACUTE ONLY): 1027 SLP Stop Time (ACUTE ONLY): 1040 SLP Time Calculation (min) (ACUTE ONLY): 13 min  Past Medical History:  Past Medical History:  Diagnosis Date  . Gestational diabetes   . Kidney stone    Past Surgical History: History reviewed. No pertinent surgical history. HPI:  Pt is a 40 year old female presenting with headache and altered mental status outside the window for tPA and CT scan shows hemorrhagic right MCA temporal infarct with small intraparenchymal and subarachnoid hemorrhage.  Hemorrhage increased from 10mm to 68mm overnight with midline shift and cytotoxic edema.   Assessment / Plan / Recommendation Clinical Impression  Pt demonstrates severe impairment in swallowing. She is begging for water and when given a cup she will participat in self feeding. She has no awareness of bolus arrival however, no apparent sensation of water in her mouth. There is severe anterior spillage on both sides and pt tries to pour water excessively. No cough or swallow elicited which is very concerning for decreased sensation. Pt was noted to have spontaneous swallows at times, so mechanism is present. Recommend NPO, pt would benefit from a Cortrak, reported to RN and MD. Will f/u for trials. SLP Visit Diagnosis: Dysphagia, oropharyngeal phase (R13.12)    Aspiration Risk  Severe aspiration risk    Diet Recommendation NPO;Alternative means - temporary        Other  Recommendations Oral Care Recommendations: Oral care QID Other Recommendations: Have oral suction available   Follow up Recommendations Inpatient Rehab      Frequency and Duration min 2x/week  2 weeks       Prognosis        Swallow Study   General HPI: Pt is a 40 year old female presenting with headache and altered mental status outside the window  for tPA and CT scan shows hemorrhagic right MCA temporal infarct with small intraparenchymal and subarachnoid hemorrhage.  Hemorrhage increased from 58mm to 7mm overnight with midline shift and cytotoxic edema. Type of Study: Bedside Swallow Evaluation Previous Swallow Assessment: none Diet Prior to this Study: NPO Temperature Spikes Noted: No Respiratory Status: Room air History of Recent Intubation: No Behavior/Cognition: Distractible;Requires cueing Oral Cavity Assessment: Within Functional Limits Oral Care Completed by SLP: Yes Oral Cavity - Dentition: Adequate natural dentition Vision: Functional for self-feeding Self-Feeding Abilities: Needs assist Patient Positioning: Upright in bed Baseline Vocal Quality: Normal Volitional Cough: Cognitively unable to elicit Volitional Swallow: Unable to elicit    Oral/Motor/Sensory Function Overall Oral Motor/Sensory Function: Moderate impairment Facial ROM: Reduced left;Suspected CN VII (facial) dysfunction Facial Symmetry: Abnormal symmetry left;Suspected CN VII (facial) dysfunction Facial Strength: Reduced left;Suspected CN VII (facial) dysfunction Facial Sensation: Reduced left;Reduced right;Suspected CN V (Trigeminal) dysfunction Lingual ROM: Reduced right;Reduced left;Suspected CN XII (hypoglossal) dysfunction Lingual Strength: Reduced;Suspected CN XII (hypoglossal) dysfunction Lingual Sensation: Reduced;Suspected CN VII (facial) dysfunction-anterior 2/3 tongue   Ice Chips Ice chips: Impaired Presentation: Spoon Oral Phase Impairments: Poor awareness of bolus   Thin Liquid Thin Liquid: Impaired Presentation: Cup;Straw Oral Phase Impairments: Poor awareness of bolus Oral Phase Functional Implications: Right anterior spillage;Left anterior spillage    Nectar Thick Nectar Thick Liquid: Not tested   Honey Thick Honey Thick Liquid: Not tested   Puree Puree: Impaired Presentation: Spoon Oral Phase Impairments: Poor awareness of  bolus;Reduced lingual movement/coordination;Reduced labial seal   Solid  Solid: Not tested      Regina Morris 08/08/2020,3:20 PM

## 2020-08-08 NOTE — Progress Notes (Signed)
STROKE TEAM PROGRESS NOTE   INTERVAL HISTORY Dr. Amada Jupiter called overnight for possible neuro decline. HCT repeated with stable midline shift and hemmorhagic evolution.  WBC 20->17.5,  tmax 99.9. BP 105-153/55-90. RR 20s on room air. EEG and TCD bubble will be planned for today (started on Keppra yesterday). Remains on 3% saline for moderate-sized right MCA infarct with hemorrhagic transformation and cytotoxic edema and mild midline shift.  Neuro exam is stable. She continues to keep eyes closed, mildly somnolent, states name upon request, spontaneously and purposefully moving all 4 extremities but not following commands. She complains of a right temporal headache. RN reports patient trying to get OOB. Some concern for urinary retention overnight but improving and still monitoring with bladder scans per RN.  Blood pressure adequately controlled. No visitors at bedside.  Vitals:   08/08/20 0530 08/08/20 0600 08/08/20 0630 08/08/20 0800  BP: (!) 147/88 139/87 118/77 130/65  Pulse: 67 84 69 66  Resp:  19 (!) 23 (!) 21  Temp:      TempSrc:      SpO2: 99% 100% 99% 100%  Weight:      Height:       CBC:  Recent Labs  Lab 08/06/20 2048 08/08/20 0253  WBC 20.5* 17.6*  HGB 9.3* 8.5*  HCT 34.2* 32.5*  MCV 60.4* 61.7*  PLT 715* 549*   Basic Metabolic Panel:  Recent Labs  Lab 08/06/20 2048 07/24/2020 1520 08/08/20 0253 08/08/20 0939  NA 135   < > 143 148*  K 3.7  --  3.6  --   CL 100  --  113*  --   CO2 20*  --  20*  --   GLUCOSE 290*  --  194*  --   BUN 9  --  10  --   CREATININE 0.61  --  0.53  --   CALCIUM 8.6*  --  8.2*  --   MG  --   --  2.0  --   PHOS  --   --  1.9*  --    < > = values in this interval not displayed.   Lipid Panel:  Recent Labs  Lab 08/04/2020 0755  CHOL 276*  TRIG 168*  HDL 45  CHOLHDL 6.1  VLDL 34  LDLCALC 007*   HgbA1c:  Recent Labs  Lab 07/21/2020 0755  HGBA1C 12.9*   Urine Drug Screen:  Recent Labs  Lab 08/06/2020 1258  LABOPIA NONE  DETECTED  COCAINSCRNUR NONE DETECTED  LABBENZ NONE DETECTED  AMPHETMU NONE DETECTED  THCU NONE DETECTED  LABBARB NONE DETECTED    Alcohol Level  Recent Labs  Lab 08/02/2020 0424  ETH <10    IMAGING  CT ABDOMEN PELVIS WO CONTRAST Result Date: 07/17/2020 IMPRESSION: No acute intra-abdominal pathology identified.   DG Chest 2 View Result Date: 08/06/2020 MPRESSION: No active cardiopulmonary disease.   CT Head Wo Contrast Result Date: 07/22/2020 IMPRESSION: Large posterior right MCA territory infarct involving the posterior right temporal cortex with superimposed moderate subarachnoid hemorrhage interdigitating within the sulci of the infarcted region and tiny subdural hematoma. Mild mass effect. No midline shift.   MR ANGIO HEAD WO CONTRAST Result Date: 07/13/2020 IMPRESSION:  1. Significantly degraded by motion.  2. Narrowed/irregularity right M2 branches correlating with the acute infarct. No detectable arterial disease elsewhere.   MR BRAIN WO CONTRAST Result Date: 07/24/2020 CT. IMPRESSION:  1. Acute infarct involving the inferior division right MCA territory with petechial hemorrhage and 4 mm midline shift.  2. Diffusion only study due to patient condition.    PHYSICAL EXAM [name].  Not in distress. . Afebrile. Head is nontraumatic. Neck is supple without bruit.    Cardiac exam no murmur or gallop. Lungs are clear to auscultation. Distal pulses are well felt. Neurological Exam:  Patient is drowsy but can be aroused and opens eyes.  Using Spanish language interpreter she is able to communicate.  She has mild dysarthria.  She has right gaze deviation and unable to look to the left past midline.  She blinks to threat on the right but not on the left.  She has left lower facial weakness.  She follows commands only intermittently..  She has dense left hemiplegia with suspect hemineglect as well.  She has purposeful antigravity movements on the right side.  Right  plantar is downgoing left is equivocal.  Gait not tested.  ASSESSMENT/PLAN [name] is a 40 y.o. female with history of gestational diabetes, kidney stone  presenting with nausea, vomiting and R temple headache.  CT head demonstrated a right posterior MCA stroke with moderate subarachnoid hemorrhage interdigitating within the sulci of the infarcted region and tiny SDH with mild mass effect.   Stroke:  right posterior MCA infarct likely due to narrowing of right M2 branches   MRI  Acute infarct involving the inferior division right MCA territory with petechial hemorrhage and 4 mm midline shift.   MRA Narrowed/irregularity right M2 branches correlating with the acute infarct.  Carotid Doppler  pending  2D Echo EF 60-65%, No thrombus, wall motion abnormality or shunt found.   SARs coronavirus negative  Hypercoag work up is pending  EEG is pending,  TCD bubble study is pending  LDL 197  HgbA1c 11.7  VTE prophylaxis - SCDs    Diet   Diet NPO time specified  Swallow eval is pending  No antithrombotic/anticoagulant prior to admission,  No anticoagulant or antiplatelet medications due to subarachnoid hemorrhage and tiny SDH  Therapy recommendations:  pending  Disposition:  Pending  Cerebral Edema  On HTS at 75cc/hr.   Na 135->136->140->143  Possible seizure  EEG pending  On Keppra since 5/26, continue  Hypertension  Home meds:  none  Stable . SBP goal < 160 to prevent further hemorrhage . Long-term BP goal normotensive  Hyperlipidemia  Home meds:  none,  LDL 197 goal < 70  Consider statin at discharge in the setting of hemorrhage  Diabetes, uncontrolled   Home meds:  Metformin 500mg  daily  HgbA1c 11.7, goal < 7.0  CBGs Recent Labs    08/08/20 0405 08/08/20 0730 08/08/20 1108  GLUCAP 178* 165* 188*      SSI  Diabetes coordinator consult  Leukocytosis  WBC 20->17.5  Tmax 99.9, monitor fever curve  No symptoms of  concern  Urinary Retention  Improving, continue to monitor with serial bladder scans q 6 hours  Other Stroke Risk Factors    Other Active Problems    I have personally obtained history,examined this patient, reviewed notes, independently viewed imaging studies, participated in medical decision making and plan of care.ROS completed by me personally and pertinent positives fully documented  I have made any additions or clarifications directly to the above note. Agree with note above.  Patient's continues to be quite sleepy and lethargic with CT scan showing slight increase in cytotoxic edema.  Continue hypertonic saline with serum sodium goal 150-155 and strict blood pressure control with systolic goal below 160 and close neurological monitoring.  Speech therapy for swallow eval and she will likely need core track tube placement for nourishment and medications.  Continue ongoing stroke work-up and await results of hypercoagulable panels, vasculitic labs and TCD bubble study today.  Long discussion with patient's husband and daughter at the bedside and daughter translated and answered questions about her prognosis and evaluation and treatment plan.This patient is critically ill and at significant risk of neurological worsening, death and care requires constant monitoring of vital signs, hemodynamics,respiratory and cardiac monitoring, extensive review of multiple databases, frequent neurological assessment, discussion with family, other specialists and medical decision making of high complexity.I have made any additions or clarifications directly to the above note.This critical care time does not reflect procedure time, or teaching time or supervisory time of PA/NP/Med Resident etc but could involve care discussion time.  I spent 30 minutes of neurocritical care time  in the care of  this patient.      Delia Heady, MD Medical Director [facility] Pager: [phone] 08/08/2020 2:05  PM       To contact Stroke Continuity provider, please refer to [email or web address]. After hours, contact General Neurology

## 2020-08-08 NOTE — Evaluation (Signed)
Physical Therapy Evaluation Patient Details Name: Regina Morris MRN: [id number] DOB: [birth year] Today's Date: 08/08/2020   History of Present Illness  40 y.o. female presenting to ED on 5/25 with N/V, R temple headache and fever. Imaging (+) large R posterior MCA CVA with superimposed moderate SAH and small SDH. PMHx significant for gestational diabetes and kidney stone.  Clinical Impression  Pt lethargic on arrival and throughout session. Pt with very brief eye opening only 1x during session and will assist to lift eye lids pt with dysconjugate gaze and unable to maintain. Pt with strong RUE and pushing away therapist hand at times with pt with left and posterior push and resistance to movement at times into sitting. Pt able to state her desire for water and her name to interpreter. Pt with left weakness, impaired cognition, balance and function limited by lethargy and impulsivity. Pt will benefit from acute therapy to maximize mobility, safety and function to decrease burden of care.      Follow Up Recommendations CIR;Supervision/Assistance - 24 hour (pending progression)    Equipment Recommendations  Other (comment) (TBD)    Recommendations for Other Services       Precautions / Restrictions Precautions Precautions: Fall Precaution Comments: L weakness, L visual field deficits, expressive and receptive difficulties, LUE edema with smart watch Restrictions Weight Bearing Restrictions: No      Mobility  Bed Mobility Overal bed mobility: Needs Assistance Bed Mobility: Supine to Sit;Sit to Supine           General bed mobility comments: Total A for supine<>sit via foot egress. with max -total assist for positioning of trunk and extremities in supine as well as seated without back supported in chair position grossly 7 min    Transfers                 General transfer comment: did not attempt given lack of arousal and command following  Ambulation/Gait                 Stairs            Wheelchair Mobility    Modified Rankin (Stroke Patients Only) Modified Rankin (Stroke Patients Only) Pre-Morbid Rankin Score: No symptoms Modified Rankin: Severe disability     Balance Overall balance assessment: Needs assistance Sitting-balance support: Bilateral upper extremity supported;Feet unsupported Sitting balance-Leahy Scale: Zero Sitting balance - Comments: Requires external assist to maintain sitting balance with bed in egress. Pushes to L. Max assist for sitting balance Postural control: Left lateral lean;Posterior lean                                   Pertinent Vitals/Pain Pain Assessment:  (CPOT= 2)     Home Living Family/patient expects to be discharged to:: Private residence Living Arrangements: Spouse/significant other;Children                    Prior Function Level of Independence: Independent               Hand Dominance        Extremity/Trunk Assessment   Upper Extremity Assessment Upper Extremity Assessment: Defer to OT evaluation    Lower Extremity Assessment Lower Extremity Assessment: LLE deficits/detail;RLE deficits/detail RLE Deficits / Details: pt with grossly 3/5 strength with automatic movement and resistance LLE Deficits / Details: pt with response to noxious stimuli and very limited movement grossly 2-/5 on LLE  Cervical / Trunk Assessment Cervical / Trunk Assessment: Normal;Other exceptions Cervical / Trunk Exceptions: pt with varied trunk position to left and posterior in sitting  Communication   Communication: Prefers language other than English;Receptive difficulties;Expressive difficulties;Interpreter utilized Biomedical scientist # [phone])  Cognition Arousal/Alertness: Lethargic Behavior During Therapy: Restless Overall Cognitive Status: Impaired/Different from baseline                                 General Comments: Patient does not  follow verbal commands; maintains eyes closed throughout despite verbal/tactile cues; garbled/incomprehensible speech per interpreter via stratus; patient demonstrates automatic responses with ability to make some needs known including wanting water; pulls away from painful stimuli in all extremities      General Comments General comments (skin integrity, edema, etc.): No family present at bedside. Edematous LUE.    Exercises General Exercises - Lower Extremity Heel Slides: PROM;Both;10 reps;Supine   Assessment/Plan    PT Assessment Patient needs continued PT services  PT Problem List Decreased strength;Decreased mobility;Decreased safety awareness;Decreased activity tolerance;Decreased cognition;Decreased balance;Decreased knowledge of use of DME;Decreased coordination       PT Treatment Interventions DME instruction;Therapeutic exercise;Gait training;Balance training;Neuromuscular re-education;Functional mobility training;Cognitive remediation;Therapeutic activities;Patient/family education    PT Goals (Current goals can be found in the Care Plan section)  Acute Rehab PT Goals Patient Stated Goal: Unable to state PT Goal Formulation: Patient unable to participate in goal setting Time For Goal Achievement: 08/22/20 Potential to Achieve Goals: Fair    Frequency Min 4X/week   Barriers to discharge        Co-evaluation PT/OT/SLP Co-Evaluation/Treatment: Yes Reason for Co-Treatment: Complexity of the patient's impairments (multi-system involvement);For patient/therapist safety PT goals addressed during session: Mobility/safety with mobility         AM-PAC PT "6 Clicks" Mobility  Outcome Measure Help needed turning from your back to your side while in a flat bed without using bedrails?: A Lot Help needed moving from lying on your back to sitting on the side of a flat bed without using bedrails?: Total Help needed moving to and from a bed to a chair (including a wheelchair)?:  Total Help needed standing up from a chair using your arms (e.g., wheelchair or bedside chair)?: Total Help needed to walk in hospital room?: Total Help needed climbing 3-5 steps with a railing? : Total 6 Click Score: 7    End of Session   Activity Tolerance: Patient limited by lethargy Patient left: in bed;with call bell/phone within reach;with bed alarm set;Other (comment) (mittens reapplied) Nurse Communication: Mobility status;Precautions PT Visit Diagnosis: Other abnormalities of gait and mobility (R26.89);Difficulty in walking, not elsewhere classified (R26.2);Other symptoms and signs involving the nervous system (R29.898)    Time: 8101-7510 PT Time Calculation (min) (ACUTE ONLY): 18 min   Charges:   PT Evaluation $PT Eval Moderate Complexity: 1 Mod          Uel Davidow P, PT Acute Rehabilitation Services Pager: [phone] Office: [phone]   Elyse Prevo B Osmani Kersten 08/08/2020, 11:19 AM

## 2020-08-08 NOTE — Evaluation (Signed)
Speech Language Pathology Evaluation Patient Details Name: Regina Morris MRN: [id number] DOB: 1980/12/27 Today's Date: 08/08/2020 Time: 0925-     Problem List:  Patient Active Problem List   Diagnosis Date Noted  . Acute right MCA stroke (HCC) August 13, 2020  . Acute pyelonephritis 12/12/2019  . Sepsis (HCC) 12/12/2019  . Hyperglycemia 12/12/2019  . Anemia, unspecified 12/12/2019  . Electrolyte disturbance 12/12/2019  . History of gestational diabetes mellitus, not pregnant 03/27/2012   Past Medical History:  Past Medical History:  Diagnosis Date  . Gestational diabetes   . Kidney stone    Past Surgical History: History reviewed. No pertinent surgical history. HPI:  Pt is a 40 year old female presenting with headache and altered mental status outside the window for tPA and CT scan shows hemorrhagic right MCA temporal infarct with small intraparenchymal and subarachnoid hemorrhage.  Hemorrhage increased from 71mm to 87mm overnight with midline shift and cytotoxic edema.   Assessment / Plan / Recommendation Clinical Impression  Pt presents with moderate dysarthria and cognitive impairment following right hemorrhagic CVA. Pt will awaken easily and becomes instantly restless with RUE pulling at covers. Pt reports severe pain on the right side of her head. Pt is poorly attentive, but can follow one step commands with a brisk response. She is perseverative on requesting water and juice. She has overt lingal weakness but can increase intelligibility with request for revision. Can also repeat short words and phrases. Awareness of deficits and safety is severely impaired. Pt does not open eyes to assess vision.  Recommend CIR at d/c. Will f/u for cognitive therapy.    SLP Assessment  SLP Recommendation/Assessment: Patient needs continued Speech Lanaguage Pathology Services SLP Visit Diagnosis: Dysarthria and anarthria (R47.1)    Follow Up Recommendations  Inpatient Rehab    Frequency  and Duration min 2x/week  2 weeks      SLP Evaluation Cognition  Overall Cognitive Status: Impaired/Different from baseline Arousal/Alertness: Lethargic Orientation Level: Oriented to person;Oriented to place Attention: Focused Focused Attention: Impaired Focused Attention Impairment: Verbal basic;Functional basic Awareness: Impaired Awareness Impairment: Intellectual impairment;Emergent impairment Problem Solving: Impaired Problem Solving Impairment: Verbal basic;Functional basic Behaviors: Restless;Impulsive       Comprehension  Auditory Comprehension Overall Auditory Comprehension: Impaired Yes/No Questions: Within Functional Limits Commands: Within Functional Limits Conversation: Simple    Expression Verbal Expression Overall Verbal Expression: Impaired Initiation: No impairment Automatic Speech: Name;Social Response Level of Generative/Spontaneous Verbalization: Phrase Repetition: No impairment Naming: Not tested Pragmatics: Unable to assess   Oral / Motor  Oral Motor/Sensory Function Overall Oral Motor/Sensory Function: Moderate impairment Facial ROM: Reduced left;Suspected CN VII (facial) dysfunction Facial Symmetry: Abnormal symmetry left;Suspected CN VII (facial) dysfunction Facial Strength: Reduced left;Suspected CN VII (facial) dysfunction Lingual ROM: Reduced right;Reduced left;Suspected CN XII (hypoglossal) dysfunction Motor Speech Overall Motor Speech: Impaired Respiration: Within functional limits Phonation: Normal Resonance: Hypernasality Articulation: Impaired Level of Impairment: Word Intelligibility: Intelligibility reduced Word: 25-49% accurate Phrase: 25-49% accurate Sentence: Not tested Conversation: Not tested Motor Planning: Witnin functional limits Motor Speech Errors: Consistent   GO                   Harlon Ditty, MA CCC-SLP  Acute Rehabilitation Services Pager [phone] Office [phone]  Claudine Mouton 08/08/2020, 9:48 AM

## 2020-08-08 NOTE — Progress Notes (Signed)
TCD bubble study completed with Dr. Pearlean Brownie. Refer to "CV Proc" under chart review to view preliminary results.  08/08/2020 2:02 PM Eula Fried., MHA, RVT, RDCS, RDMS

## 2020-08-08 NOTE — Progress Notes (Signed)
Inpatient Diabetes Program Recommendations  AACE/ADA: New Consensus Statement on Inpatient Glycemic Control (2015)  Target Ranges:  Prepandial:   less than 140 mg/dL      Peak postprandial:   less than 180 mg/dL (1-2 hours)      Critically ill patients:  140 - 180 mg/dL   Lab Results  Component Value Date   GLUCAP 188 (H) 08/08/2020   HGBA1C 12.9 (H) 08/08/2020    Review of Glycemic Control Results for Regina Morris, Regina Morris (MRN [id number]) as of 08/08/2020 13:18  Ref. Range 07/29/2020 19:48 08/08/2020 00:04 08/08/2020 04:05 08/08/2020 07:30 08/08/2020 11:08  Glucose-Capillary Latest Ref Range: 70 - 99 mg/dL 482 (H) 707 (H) 867 (H) 165 (H) 188 (H)   Diabetes history: DM 2 Outpatient Diabetes medications:  Metformin 500 mg daily Current orders for Inpatient glycemic control:  Novolog moderate q 4 hours  Inpatient Diabetes Program Recommendations:    Referral received.  A1C indicates average blood sugars of 324 mg/dL.  Not appropriate for education at this time.  Patient will likely need insulin at d/c based on A1C.  Consider adding Lantus 12 units daily.    Thanks,  Beryl Meager, RN, BC-ADM Inpatient Diabetes Coordinator Pager [phone] (8a-5p)

## 2020-08-08 NOTE — Procedures (Signed)
Cortrak  Person Inserting Tube:  Meera Vasco C, RD Tube Type:  Cortrak - 43 inches Tube Location:  Left nare Initial Placement:  Stomach Secured by: Bridle Technique Used to Measure Tube Placement:  Documented cm marking at nare/ corner of mouth Cortrak Secured At:  60 cm    Cortrak Tube Team Note:  Consult received to place a Cortrak feeding tube.    X-ray is required, abdominal x-ray has been ordered by the Cortrak team. Please confirm tube placement before using the Cortrak tube.   If the tube becomes dislodged please keep the tube and contact the Cortrak team at [email or web address] ([email or web address]) for replacement.  If after hours and replacement cannot be delayed, place a NG tube and confirm placement with an abdominal x-ray.    Regina Morris P., RD, LDN, CNSC See AMiON for contact information    

## 2020-08-08 NOTE — Procedures (Signed)
Patient Name: Nyasia Baxley  MRN: [id number]  Epilepsy Attending: Charlsie Quest  Referring Physician/Provider: Thelma Comp Sharol Harness Date: 08/08/2020 Duration: 23.13 mins  Patient history: 40 year old female with right MCA infarct with hemorrhagic transformation with worsening mental status.  EEG to evaluate for seizures.  Level of alertness: Awake, asleep  AEDs during EEG study: Keppra  Technical aspects: This EEG study was done with scalp electrodes positioned according to the 10-20 International system of electrode placement. Electrical activity was acquired at a sampling rate of 500Hz  and reviewed with a high frequency filter of 70Hz  and a low frequency filter of 1Hz . EEG data were recorded continuously and digitally stored.   Description: The posterior dominant rhythm consists of 7.5-8 Hz activity of moderate voltage (25-35 uV) seen predominantly in posterior head regions, asymmetric ( right<left) and reactive to eye opening and eye closing. Sleep was characterized by vertex waves, sleep spindles (12 to 14 Hz), maximal frontocentral region. EEG showed continuous 3 to 5 Hz theta-delta slowing in right hemisphere, maximal right frontal region. Intermittent 3 to 5 Hz theta-delta slowing was also noted in left hemisphere.  Sharp waves were noted in right frontal region.  Hyperventilation and photic stimulation were not performed.     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  Additionally there is evidence of cortical dysfunction in right hemisphere, maximal right frontal region likely secondary to underlying stroke. There is also mild diffuse encephalopathy, nonspecific etiology. No seizures were seen throughout the recording.  Jonah Nestle 

## 2020-08-08 NOTE — Progress Notes (Signed)
Inpatient Rehab Admissions Coordinator Note:   Per therapy recommendations, pt was screened for CIR candidacy by Megan Salon, MS CCC-SLP. At this time, pt. Does not appear able to tolerate CIR level therapies at this time, But [facility] team will follow for tolerance and participation and place consult order if Pt. Appears to be an appropriate candidate. Please contact me with questions.    Megan Salon, MS, CCC-SLP Rehab Admissions Coordinator  [phone] (celll) [phone] (office)

## 2020-08-08 NOTE — Progress Notes (Signed)
Initial Nutrition Assessment  DOCUMENTATION CODES:   Not applicable  INTERVENTION:   -Once cortrak has been placed and placement has been verified:   Initiate Jevity 1.5 @ 55 ml/hr via cortrak tube (1320 ml/day)  45 ml Prosource TF daily  Tube feeding regimen provides 2020 kcal (100% of needs), 95 grams of protein, and 1003 ml of H2O.   NUTRITION DIAGNOSIS:   Inadequate oral intake related to inability to eat as evidenced by NPO status.  GOAL:   Patient will meet greater than or equal to 90% of their needs  MONITOR:   Diet advancement,Labs,Weight trends,TF tolerance,Skin,I & O's  REASON FOR ASSESSMENT:        ASSESSMENT:   Regina Morris is a 40 y.o. female with PMH significant for gestational diabetes, kidney stone who presented to [facility] ED with nausea, vomiting and R temple headache. Workup with CTH demonstrated a R posterior MCA stroke with moderate subarachnoid hemorrhage interdigitating within the sulci of the infarcted region and tiny SDH with mild mass effect.  Pt admitted with rt posterior MCA stroke and moderate SAH.   Reviewed I/O's: +917 ml x 24 hours  UOP: 450 ml x 24 hours  Case discussed with RN; confirmed plan for cortrak placement today.   Reviewed wt hx; wt has been stable over the past several years.   Medications reviewed and include keppra, potassium phosphate, and 5% sodium chloride solution @ 75 ml/hr.   Lab Results  Component Value Date   HGBA1C 12.9 (H) 07/31/2020   PTA DM medications are 500 mh metformin daily.   Labs reviewed: CBGS: 165-188 (inpatient orders for glycemic control are 0-15 units insulin aspart every 4 hours).   NUTRITION - FOCUSED PHYSICAL EXAM:  Flowsheet Row Most Recent Value  Orbital Region No depletion  Upper Arm Region No depletion  Thoracic and Lumbar Region No depletion  Buccal Region No depletion  Temple Region No depletion  Clavicle Bone Region No depletion  Clavicle and Acromion Bone Region No  depletion  Scapular Bone Region No depletion  Dorsal Hand No depletion  Patellar Region No depletion  Anterior Thigh Region No depletion  Posterior Calf Region No depletion  Edema (RD Assessment) None  Hair Reviewed  Eyes Reviewed  Mouth Reviewed  Skin Reviewed  Nails Reviewed       Diet Order:   Diet Order            Diet NPO time specified  Diet effective now                 EDUCATION NEEDS:   No education needs have been identified at this time  Skin:  Skin Assessment: Reviewed RN Assessment  Last BM:  Unknown  Height:   Ht Readings from Last 1 Encounters:  07/30/2020 5\' 6"  (1.676 m)    Weight:   Wt Readings from Last 1 Encounters:  07/23/2020 73.7 kg    Ideal Body Weight:  59.1 kg  BMI:  Body mass index is 26.22 kg/m.  Estimated Nutritional Needs:   Kcal:  1850-2050  Protein:  95-110 grams  Fluid:  > 1.8 L    08/09/20, RD, LDN, CDCES Registered Dietitian II Certified Diabetes Care and Education Specialist Please refer to [facility] for RD and/or RD on-call/weekend/after hours pager

## 2020-08-08 NOTE — Progress Notes (Signed)
Cortrak Tube Team Note:  Consult received to place a Cortrak feeding tube.  Tube pulled out by pt, repositioned and documented.   X-ray is required, abdominal x-ray has been ordered by the Cortrak team. Please confirm tube placement before using the Cortrak tube.   If the tube becomes dislodged please keep the tube and contact the Cortrak team at [email or web address] ([email or web address]) for replacement.  If after hours and replacement cannot be delayed, place a NG tube and confirm placement with an abdominal x-ray.    Cammy Copa., RD, LDN, CNSC See AMiON for contact information

## 2020-08-08 NOTE — Evaluation (Signed)
Occupational Therapy Evaluation Patient Details Name: Regina Morris MRN: [id number] DOB: 06-29-1980 Today's Date: 08/08/2020    History of Present Illness 40 y.o. female presenting to ED on 5/25 with N/V, R temple headache and fever. Imaging (+) large R posterior MCA CVA with superimposed moderate SAH and small SDH. PMHx significant for gestational diabetes and kidney stone.   Clinical Impression   Interpreter services via stratus. Patient maintains eyes closed throughout evaluation. Unable to provide home set-up or PLOF. PTA patient was living with her husband and 2 children presumably I. Patient currently functioning below baseline requiring Total A grossly for ADLs and bed mobility secondary to cognitive deficits. Patient also limited by deficits listed below including L-sided weakness, expressive/receptive difficulties, visual deficits, and motor deficits and would benefit from continued acute OT services in prep for safe d/c to next level of care with recommendation for CIR.     Follow Up Recommendations  CIR    Equipment Recommendations  Other (comment) (TBD)    Recommendations for Other Services Rehab consult     Precautions / Restrictions Precautions Precautions: Fall Precaution Comments: L weakness, L visual field deficits, expressive and receptive difficulties Restrictions Weight Bearing Restrictions: No      Mobility Bed Mobility               General bed mobility comments: Total A for positioning in supine before and after placing bed in egress.    Transfers                 General transfer comment: Deferred    Balance Overall balance assessment: Needs assistance Sitting-balance support: Bilateral upper extremity supported;Feet unsupported (Bed in egress position) Sitting balance-Leahy Scale: Poor Sitting balance - Comments: Requires external assist to maintain sitting balance with bed in egress. Pushes to L. Postural control: Left lateral  lean;Posterior lean                                 ADL either performed or assessed with clinical judgement   ADL Overall ADL's : Needs assistance/impaired     Grooming: Total assistance;Bed level Grooming Details (indicate cue type and reason): Total A to wash face. Patient does not attempt to assist. Eyes remain closed throughout assessment.             Lower Body Dressing: Total assistance;Bed level Lower Body Dressing Details (indicate cue type and reason): Total A to don footwear in supine. Patient does not attempt to assist.                     Vision Baseline Vision/History:  (Unknown) Patient Visual Report: Other (comment) (Unable to verbalize 2/2 decreased cognition) Vision Assessment?: Vision impaired- to be further tested in functional context Additional Comments: Patient with disconjugate gaze. Does not track stimuli in central visual field.     Perception     Praxis      Pertinent Vitals/Pain Pain Assessment: Faces Faces Pain Scale: Hurts even more     Hand Dominance     Extremity/Trunk Assessment Upper Extremity Assessment Upper Extremity Assessment: Difficult to assess due to impaired cognition (Patient unable to follow verbal commands necessary for formal assessment. Noted spontaneous movement in RUE < LUE.)   Lower Extremity Assessment Lower Extremity Assessment: Defer to PT evaluation       Communication Communication Communication: Prefers language other than English;Receptive difficulties;Expressive difficulties   Cognition Arousal/Alertness: Lethargic  Behavior During Therapy: Restless Overall Cognitive Status: Impaired/Different from baseline                                 General Comments: Patient does not follow verbal commands; maintains eyes closed throughout despite verbal/tactile cues; garbled/incomprehensible speech per interpreter via stratus; patient demonstrates automatic responses with ability  to make some needs known including wanting water; pulls away from painful stimuli in all extremities   General Comments  No family present at bedside. Edematous LUE.    Exercises     Shoulder Instructions      Home Living Family/patient expects to be discharged to:: Private residence Living Arrangements: Spouse/significant other;Children (2 children)                                      Prior Functioning/Environment Level of Independence: Independent                 OT Problem List: Decreased strength;Decreased range of motion;Decreased activity tolerance;Impaired balance (sitting and/or standing);Impaired vision/perception;Decreased coordination;Decreased cognition;Decreased safety awareness;Decreased knowledge of use of DME or AE;Decreased knowledge of precautions;Impaired UE functional use;Increased edema      OT Treatment/Interventions: Self-care/ADL training;Therapeutic exercise;Neuromuscular education;Therapeutic activities;Cognitive remediation/compensation;Visual/perceptual remediation/compensation;Balance training;Patient/family education    OT Goals(Current goals can be found in the care plan section) Acute Rehab OT Goals Patient Stated Goal: Unable to state OT Goal Formulation: Patient unable to participate in goal setting Time For Goal Achievement: 08/22/20 Potential to Achieve Goals: Good ADL Goals Additional ADL Goal #1: Patient will follow 1-step verbal commands with 50% accuracy in prep for ADLs. Additional ADL Goal #2: Patient will maintain static sitting balance at EOB with supervision A in prep for ADLs. Additional ADL Goal #3: Patient will complete bed mobility with supervision A and no more than min cues in prep for ADLs. Additional ADL Goal #4: Patient will complete 1 seated grooming task with supervision A and no more than min multimodal cues.  OT Frequency: Min 3X/week   Barriers to D/C:            Co-evaluation               AM-PAC OT "6 Clicks" Daily Activity     Outcome Measure Help from another person eating meals?: Total Help from another person taking care of personal grooming?: Total Help from another person toileting, which includes using toliet, bedpan, or urinal?: Total Help from another person bathing (including washing, rinsing, drying)?: Total Help from another person to put on and taking off regular upper body clothing?: Total Help from another person to put on and taking off regular lower body clothing?: Total 6 Click Score: 6   End of Session Nurse Communication: Mobility status  Activity Tolerance: Patient limited by lethargy;Treatment limited secondary to agitation Patient left: in bed;with call bell/phone within reach;with bed alarm set  OT Visit Diagnosis: Unsteadiness on feet (R26.81);Muscle weakness (generalized) (M62.81);Cognitive communication deficit (R41.841);Hemiplegia and hemiparesis Symptoms and signs involving cognitive functions: Nontraumatic SAH;Cerebral infarction Hemiplegia - Right/Left: Left Hemiplegia - dominant/non-dominant:  (Unknown) Hemiplegia - caused by: Nontraumatic SAH;Cerebral infarction                Time: 5638-9373 OT Time Calculation (min): 17 min Charges:  OT General Charges $OT Visit: 1 Visit OT Evaluation $OT Eval Moderate Complexity: 1 Mod  Sumer Moorehouse H. OTR/L  Supplemental OT, Department of rehab services [phone]  Aalyssa Elderkin R H. 08/08/2020, 9:43 AM

## 2020-08-09 ENCOUNTER — Inpatient Hospital Stay (HOSPITAL_COMMUNITY): Payer: Self-pay | Admitting: Certified Registered"

## 2020-08-09 ENCOUNTER — Inpatient Hospital Stay (HOSPITAL_COMMUNITY): Admission: EM | Disposition: E | Payer: Self-pay | Source: Home / Self Care | Attending: Neurology

## 2020-08-09 ENCOUNTER — Encounter (HOSPITAL_COMMUNITY): Payer: Self-pay | Admitting: Neurology

## 2020-08-09 ENCOUNTER — Inpatient Hospital Stay (HOSPITAL_COMMUNITY): Payer: Self-pay

## 2020-08-09 DIAGNOSIS — I63511 Cerebral infarction due to unspecified occlusion or stenosis of right middle cerebral artery: Secondary | ICD-10-CM

## 2020-08-09 DIAGNOSIS — R9089 Other abnormal findings on diagnostic imaging of central nervous system: Secondary | ICD-10-CM

## 2020-08-09 DIAGNOSIS — D72829 Elevated white blood cell count, unspecified: Secondary | ICD-10-CM

## 2020-08-09 DIAGNOSIS — G935 Compression of brain: Secondary | ICD-10-CM

## 2020-08-09 DIAGNOSIS — J9601 Acute respiratory failure with hypoxia: Secondary | ICD-10-CM

## 2020-08-09 DIAGNOSIS — E782 Mixed hyperlipidemia: Secondary | ICD-10-CM

## 2020-08-09 DIAGNOSIS — R569 Unspecified convulsions: Secondary | ICD-10-CM

## 2020-08-09 DIAGNOSIS — G9341 Metabolic encephalopathy: Secondary | ICD-10-CM

## 2020-08-09 DIAGNOSIS — E1165 Type 2 diabetes mellitus with hyperglycemia: Secondary | ICD-10-CM

## 2020-08-09 HISTORY — PX: CRANIOTOMY: SHX93

## 2020-08-09 LAB — POCT I-STAT 7, (LYTES, BLD GAS, ICA,H+H)
Acid-base deficit: 7 mmol/L — ABNORMAL HIGH (ref 0.0–2.0)
Acid-base deficit: 4 mmol/L — ABNORMAL HIGH (ref 0.0–2.0)
Bicarbonate: 18.5 mmol/L — ABNORMAL LOW (ref 20.0–28.0)
Bicarbonate: 19.5 mmol/L — ABNORMAL LOW (ref 20.0–28.0)
Calcium, Ion: 1.11 mmol/L — ABNORMAL LOW (ref 1.15–1.40)
Calcium, Ion: 1.1 mmol/L — ABNORMAL LOW (ref 1.15–1.40)
HCT: 22 % — ABNORMAL LOW (ref 36.0–46.0)
HCT: 20 % — ABNORMAL LOW (ref 36.0–46.0)
Hemoglobin: 7.5 g/dL — ABNORMAL LOW (ref 12.0–15.0)
Hemoglobin: 6.8 g/dL — CL (ref 12.0–15.0)
O2 Saturation: 100 %
O2 Saturation: 100 %
Patient temperature: 36.8
Patient temperature: 99.1
Potassium: 3.5 mmol/L (ref 3.5–5.1)
Potassium: 3.4 mmol/L — ABNORMAL LOW (ref 3.5–5.1)
Sodium: 157 mmol/L — ABNORMAL HIGH (ref 135–145)
Sodium: 157 mmol/L — ABNORMAL HIGH (ref 135–145)
TCO2: 20 mmol/L — ABNORMAL LOW (ref 22–32)
TCO2: 20 mmol/L — ABNORMAL LOW (ref 22–32)
pCO2 arterial: 35.5 mmHg (ref 32.0–48.0)
pCO2 arterial: 30.1 mmHg — ABNORMAL LOW (ref 32.0–48.0)
pH, Arterial: 7.323 — ABNORMAL LOW (ref 7.350–7.450)
pH, Arterial: 7.422 (ref 7.350–7.450)
pO2, Arterial: 217 mmHg — ABNORMAL HIGH (ref 83.0–108.0)
pO2, Arterial: 480 mmHg — ABNORMAL HIGH (ref 83.0–108.0)

## 2020-08-09 LAB — BASIC METABOLIC PANEL
Anion gap: 14 (ref 5–15)
BUN: 5 mg/dL — ABNORMAL LOW (ref 6–20)
CO2: 21 mmol/L — ABNORMAL LOW (ref 22–32)
Calcium: 8.9 mg/dL (ref 8.9–10.3)
Chloride: 119 mmol/L — ABNORMAL HIGH (ref 98–111)
Creatinine, Ser: 0.51 mg/dL (ref 0.44–1.00)
GFR, Estimated: >60 mL/min (ref >60)
Glucose, Bld: 182 mg/dL — ABNORMAL HIGH (ref 70–99)
Potassium: 4.2 mmol/L (ref 3.5–5.1)
Sodium: 154 mmol/L — ABNORMAL HIGH (ref 135–145)

## 2020-08-09 LAB — CBC
HCT: 32.2 % — ABNORMAL LOW (ref 36.0–46.0)
Hemoglobin: 8.6 g/dL — ABNORMAL LOW (ref 12.0–15.0)
MCH: 16.6 pg — ABNORMAL LOW (ref 26.0–34.0)
MCHC: 26.7 g/dL — ABNORMAL LOW (ref 30.0–36.0)
MCV: 62 fL — ABNORMAL LOW (ref 80.0–100.0)
Platelets: 524 10*3/uL — ABNORMAL HIGH (ref 150–400)
RBC: 5.19 MIL/uL — ABNORMAL HIGH (ref 3.87–5.11)
RDW: 20.3 % — ABNORMAL HIGH (ref 11.5–15.5)
WBC: 16.4 10*3/uL — ABNORMAL HIGH (ref 4.0–10.5)
nRBC: 0 % (ref 0.0–0.2)

## 2020-08-09 LAB — URINALYSIS, COMPLETE (UACMP) WITH MICROSCOPIC
Bacteria, UA: NONE SEEN
Bilirubin Urine: NEGATIVE
Glucose, UA: >=500 mg/dL — AB
Ketones, ur: 80 mg/dL — AB
Leukocytes,Ua: NEGATIVE
Nitrite: NEGATIVE
Protein, ur: 100 mg/dL — AB
Specific Gravity, Urine: 1.022 (ref 1.005–1.030)
pH: 5 (ref 5.0–8.0)

## 2020-08-09 LAB — GLUCOSE, CAPILLARY
Glucose-Capillary: 160 mg/dL — ABNORMAL HIGH (ref 70–99)
Glucose-Capillary: 180 mg/dL — ABNORMAL HIGH (ref 70–99)
Glucose-Capillary: 172 mg/dL — ABNORMAL HIGH (ref 70–99)
Glucose-Capillary: 245 mg/dL — ABNORMAL HIGH (ref 70–99)
Glucose-Capillary: 237 mg/dL — ABNORMAL HIGH (ref 70–99)
Glucose-Capillary: 173 mg/dL — ABNORMAL HIGH (ref 70–99)

## 2020-08-09 LAB — SODIUM
Sodium: 156 mmol/L — ABNORMAL HIGH (ref 135–145)
Sodium: 155 mmol/L — ABNORMAL HIGH (ref 135–145)
Sodium: 158 mmol/L — ABNORMAL HIGH (ref 135–145)

## 2020-08-09 LAB — ABO/RH: ABO/RH(D): A POS

## 2020-08-09 LAB — PREPARE RBC (CROSSMATCH)

## 2020-08-09 LAB — ANTINUCLEAR ANTIBODIES, IFA: ANA Ab, IFA: NEGATIVE

## 2020-08-09 SURGERY — CRANIOTOMY BONE FLAP/PROSTHETIC PLATE
Anesthesia: General | Laterality: Right

## 2020-08-09 MED ORDER — ONDANSETRON HCL 4 MG/2ML IJ SOLN
INTRAMUSCULAR | Status: AC
  Filled 2020-08-09: qty 2

## 2020-08-09 MED ORDER — FENTANYL BOLUS VIA INFUSION
50.0000 ug | INTRAVENOUS | Status: DC | PRN
  Administered 2020-08-09: 50 ug via INTRAVENOUS
  Administered 2020-08-11: 100 ug via INTRAVENOUS
  Filled 2020-08-09: qty 100

## 2020-08-09 MED ORDER — ROCURONIUM BROMIDE 10 MG/ML (PF) SYRINGE
PREFILLED_SYRINGE | INTRAVENOUS | Status: AC
  Filled 2020-08-09: qty 10

## 2020-08-09 MED ORDER — LIDOCAINE 2% (20 MG/ML) 5 ML SYRINGE
INTRAMUSCULAR | Status: DC | PRN
  Administered 2020-08-09: 80 mg via INTRAVENOUS

## 2020-08-09 MED ORDER — MIDAZOLAM HCL 2 MG/2ML IJ SOLN
INTRAMUSCULAR | Status: AC
  Filled 2020-08-09: qty 2

## 2020-08-09 MED ORDER — BUPIVACAINE HCL (PF) 0.5 % IJ SOLN
INTRAMUSCULAR | Status: AC
  Filled 2020-08-09: qty 30

## 2020-08-09 MED ORDER — PHENYLEPHRINE HCL-NACL 10-0.9 MG/250ML-% IV SOLN
INTRAVENOUS | Status: DC | PRN
  Administered 2020-08-09: 30 ug/min via INTRAVENOUS

## 2020-08-09 MED ORDER — MUPIROCIN 2 % EX OINT
TOPICAL_OINTMENT | CUTANEOUS | Status: AC
  Filled 2020-08-09: qty 22

## 2020-08-09 MED ORDER — THROMBIN 20000 UNITS EX SOLR
OROMUCOSAL | Status: DC | PRN
  Administered 2020-08-09: 5 mL via TOPICAL

## 2020-08-09 MED ORDER — ESMOLOL HCL 100 MG/10ML IV SOLN
INTRAVENOUS | Status: DC | PRN
  Administered 2020-08-09: 20 mg via INTRAVENOUS

## 2020-08-09 MED ORDER — ESMOLOL HCL 100 MG/10ML IV SOLN
INTRAVENOUS | Status: AC
  Filled 2020-08-09: qty 10

## 2020-08-09 MED ORDER — ATORVASTATIN CALCIUM 80 MG PO TABS: 80.0000 mg | ORAL_TABLET | Freq: Every day | ORAL | Status: DC

## 2020-08-09 MED ORDER — ONDANSETRON HCL 4 MG/2ML IJ SOLN
INTRAMUSCULAR | Status: DC | PRN
  Administered 2020-08-09: 4 mg via INTRAVENOUS

## 2020-08-09 MED ORDER — POLYETHYLENE GLYCOL 3350 17 G PO PACK
17.0000 g | PACK | Freq: Every day | ORAL | Status: DC
  Administered 2020-08-11 – 2020-08-12 (×2): 17 g
  Filled 2020-08-09 (×3): qty 1

## 2020-08-09 MED ORDER — SODIUM CHLORIDE 0.9 % IV SOLN: INTRAVENOUS | Status: DC | PRN

## 2020-08-09 MED ORDER — FENTANYL 2500MCG IN NS 250ML (10MCG/ML) PREMIX INFUSION
50.0000 ug/h | INTRAVENOUS | Status: DC
  Administered 2020-08-09: 50 ug/h via INTRAVENOUS
  Filled 2020-08-09 (×2): qty 250

## 2020-08-09 MED ORDER — THROMBIN 5000 UNITS EX SOLR
CUTANEOUS | Status: AC
  Filled 2020-08-09: qty 5000

## 2020-08-09 MED ORDER — THROMBIN 20000 UNITS EX SOLR
CUTANEOUS | Status: DC | PRN
  Administered 2020-08-09: 20 mL via TOPICAL

## 2020-08-09 MED ORDER — LIDOCAINE 2% (20 MG/ML) 5 ML SYRINGE
INTRAMUSCULAR | Status: AC
  Filled 2020-08-09: qty 5

## 2020-08-09 MED ORDER — BUPIVACAINE HCL (PF) 0.5 % IJ SOLN
INTRAMUSCULAR | Status: DC | PRN
  Administered 2020-08-09: 15 mL

## 2020-08-09 MED ORDER — FENTANYL CITRATE (PF) 100 MCG/2ML IJ SOLN
50.0000 ug | Freq: Once | INTRAMUSCULAR | Status: AC
  Administered 2020-08-09: 50 ug via INTRAVENOUS

## 2020-08-09 MED ORDER — DEXAMETHASONE SODIUM PHOSPHATE 10 MG/ML IJ SOLN
INTRAMUSCULAR | Status: DC | PRN
  Administered 2020-08-09: 10 mg via INTRAVENOUS

## 2020-08-09 MED ORDER — LIDOCAINE-EPINEPHRINE 1 %-1:100000 IJ SOLN
INTRAMUSCULAR | Status: AC
  Filled 2020-08-09: qty 1

## 2020-08-09 MED ORDER — FENTANYL CITRATE (PF) 250 MCG/5ML IJ SOLN
INTRAMUSCULAR | Status: AC
  Filled 2020-08-09: qty 5

## 2020-08-09 MED ORDER — PROPOFOL 10 MG/ML IV BOLUS
INTRAVENOUS | Status: DC | PRN
  Administered 2020-08-09: 70 mg via INTRAVENOUS

## 2020-08-09 MED ORDER — SODIUM CHLORIDE 0.9 % IV SOLN
INTRAVENOUS | Status: DC | PRN
  Administered 2020-08-09: 250 mL via INTRAVENOUS

## 2020-08-09 MED ORDER — ALBUMIN HUMAN 5 % IV SOLN: INTRAVENOUS | Status: DC | PRN

## 2020-08-09 MED ORDER — FENTANYL CITRATE (PF) 100 MCG/2ML IJ SOLN
INTRAMUSCULAR | Status: DC | PRN
  Administered 2020-08-09 (×2): 100 ug via INTRAVENOUS
  Administered 2020-08-09: 50 ug via INTRAVENOUS

## 2020-08-09 MED ORDER — VANCOMYCIN HCL IN DEXTROSE 1-5 GM/200ML-% IV SOLN
1000.0000 mg | Freq: Once | INTRAVENOUS | Status: AC
  Administered 2020-08-09: 1000 mg via INTRAVENOUS

## 2020-08-09 MED ORDER — NICARDIPINE HCL IN NACL 20-0.86 MG/200ML-% IV SOLN: 3.0000 mg/h | INTRAVENOUS | Status: DC

## 2020-08-09 MED ORDER — BACITRACIN ZINC 500 UNIT/GM EX OINT
TOPICAL_OINTMENT | CUTANEOUS | Status: AC
  Filled 2020-08-09: qty 28.35

## 2020-08-09 MED ORDER — VANCOMYCIN HCL IN DEXTROSE 1-5 GM/200ML-% IV SOLN
INTRAVENOUS | Status: AC
  Filled 2020-08-09: qty 200

## 2020-08-09 MED ORDER — THROMBIN 20000 UNITS EX SOLR
CUTANEOUS | Status: AC
  Filled 2020-08-09: qty 20000

## 2020-08-09 MED ORDER — DOCUSATE SODIUM 50 MG/5ML PO LIQD
100.0000 mg | Freq: Two times a day (BID) | ORAL | Status: DC
  Administered 2020-08-09: 100 mg
  Filled 2020-08-09 (×2): qty 10

## 2020-08-09 MED ORDER — PROPOFOL 500 MG/50ML IV EMUL
INTRAVENOUS | Status: DC | PRN
  Administered 2020-08-09: 50 ug/kg/min via INTRAVENOUS

## 2020-08-09 MED ORDER — HYDRALAZINE HCL 20 MG/ML IJ SOLN
10.0000 mg | INTRAMUSCULAR | Status: DC | PRN
  Administered 2020-08-11: 10 mg via INTRAVENOUS
  Filled 2020-08-09: qty 1

## 2020-08-09 MED ORDER — SUCCINYLCHOLINE CHLORIDE 200 MG/10ML IV SOSY
PREFILLED_SYRINGE | INTRAVENOUS | Status: AC
  Filled 2020-08-09: qty 10

## 2020-08-09 MED ORDER — CHLORHEXIDINE GLUCONATE 0.12% ORAL RINSE (MEDLINE KIT)
15.0000 mL | Freq: Two times a day (BID) | OROMUCOSAL | Status: DC
  Administered 2020-08-09 – 2020-08-16 (×14): 15 mL via OROMUCOSAL

## 2020-08-09 MED ORDER — SODIUM CHLORIDE 0.9% IV SOLUTION: Freq: Once | INTRAVENOUS | Status: DC

## 2020-08-09 MED ORDER — LIDOCAINE-EPINEPHRINE 1 %-1:100000 IJ SOLN
INTRAMUSCULAR | Status: DC | PRN
  Administered 2020-08-09: 15 mL

## 2020-08-09 MED ORDER — 0.9 % SODIUM CHLORIDE (POUR BTL) OPTIME
TOPICAL | Status: DC | PRN
  Administered 2020-08-09 (×5): 1000 mL

## 2020-08-09 MED ORDER — ORAL CARE MOUTH RINSE
15.0000 mL | OROMUCOSAL | Status: DC
  Administered 2020-08-09 – 2020-08-16 (×70): 15 mL via OROMUCOSAL

## 2020-08-09 MED ORDER — PROPOFOL 10 MG/ML IV BOLUS
INTRAVENOUS | Status: AC
  Filled 2020-08-09: qty 40

## 2020-08-09 MED ORDER — ROCURONIUM BROMIDE 10 MG/ML (PF) SYRINGE
PREFILLED_SYRINGE | INTRAVENOUS | Status: DC | PRN
  Administered 2020-08-09: 30 mg via INTRAVENOUS
  Administered 2020-08-09: 60 mg via INTRAVENOUS
  Administered 2020-08-09: 40 mg via INTRAVENOUS
  Administered 2020-08-09: 20 mg via INTRAVENOUS

## 2020-08-09 MED ORDER — DEXAMETHASONE SODIUM PHOSPHATE 10 MG/ML IJ SOLN
INTRAMUSCULAR | Status: AC
  Filled 2020-08-09: qty 1

## 2020-08-09 SURGICAL SUPPLY — 79 items
BAND RUBBER #18 3X1/16 STRL (MISCELLANEOUS) ×3 IMPLANT
BASKET BONE COLLECTION (BASKET) IMPLANT
BIT DRILL WIRE PASS 1.3MM (BIT) IMPLANT
BNDG GAUZE ELAST 4 BULKY (GAUZE/BANDAGES/DRESSINGS) IMPLANT
BNDG STRETCH 4X75 STRL LF (GAUZE/BANDAGES/DRESSINGS) IMPLANT
BUR ACORN 6.0 PRECISION (BURR) ×2 IMPLANT
BUR ACORN 6.0MM PRECISION (BURR) ×1
BUR SPIRAL ROUTER 2.3 (BUR) ×2 IMPLANT
BUR SPIRAL ROUTER 2.3MM (BUR) ×1
CANISTER SUCT 3000ML PPV (MISCELLANEOUS) ×6 IMPLANT
CARTRIDGE OIL MAESTRO DRILL (MISCELLANEOUS) ×1 IMPLANT
CATH ROBINSON RED A/P 12FR (CATHETERS) IMPLANT
CLIP RANEY DISP (INSTRUMENTS) ×3 IMPLANT
CLIP VESOCCLUDE MED 6/CT (CLIP) IMPLANT
COVER WAND RF STERILE (DRAPES) ×3 IMPLANT
DECANTER SPIKE VIAL GLASS SM (MISCELLANEOUS) ×6 IMPLANT
DERMABOND ADVANCED (GAUZE/BANDAGES/DRESSINGS) ×2
DERMABOND ADVANCED .7 DNX12 (GAUZE/BANDAGES/DRESSINGS) ×1 IMPLANT
DIFFUSER DRILL AIR PNEUMATIC (MISCELLANEOUS) ×3 IMPLANT
DRAIN PENROSE 1/2X12 LTX STRL (WOUND CARE) IMPLANT
DRAPE INCISE IOBAN 66X45 STRL (DRAPES) ×3 IMPLANT
DRAPE NEUROLOGICAL W/INCISE (DRAPES) ×3 IMPLANT
DRAPE WARM FLUID 44X44 (DRAPES) ×3 IMPLANT
DRILL WIRE PASS 1.3MM (BIT)
DRSG OPSITE POSTOP 4X10 (GAUZE/BANDAGES/DRESSINGS) ×3 IMPLANT
DRSG OPSITE POSTOP 4X6 (GAUZE/BANDAGES/DRESSINGS) ×3 IMPLANT
DRSG OPSITE POSTOP 4X8 (GAUZE/BANDAGES/DRESSINGS) ×3 IMPLANT
DRSG PAD ABDOMINAL 8X10 ST (GAUZE/BANDAGES/DRESSINGS) IMPLANT
DURAMATRIX ONLAY 3X3 (Plate) ×9 IMPLANT
DURAPREP 26ML APPLICATOR (WOUND CARE) ×3 IMPLANT
DURAPREP 6ML APPLICATOR 50/CS (WOUND CARE) ×3 IMPLANT
ELECT REM PT RETURN 9FT ADLT (ELECTROSURGICAL) ×3
ELECTRODE REM PT RTRN 9FT ADLT (ELECTROSURGICAL) ×1 IMPLANT
EVACUATOR SILICONE 100CC (DRAIN) IMPLANT
FORCEPS BIPOLAR SPETZLER 8 1.0 (NEUROSURGERY SUPPLIES) ×3 IMPLANT
GAUZE 4X4 16PLY RFD (DISPOSABLE) IMPLANT
GAUZE SPONGE 4X4 12PLY STRL (GAUZE/BANDAGES/DRESSINGS) IMPLANT
GLOVE BIO SURGEON STRL SZ8 (GLOVE) ×3 IMPLANT
GLOVE BIOGEL PI IND STRL 8.5 (GLOVE) ×1 IMPLANT
GLOVE BIOGEL PI INDICATOR 8.5 (GLOVE) ×2
GLOVE ECLIPSE 8.0 STRL XLNG CF (GLOVE) ×3 IMPLANT
GLOVE EXAM NITRILE XL STR (GLOVE) IMPLANT
GLOVE SRG 8 PF TXTR STRL LF DI (GLOVE) ×1 IMPLANT
GLOVE SURG UNDER POLY LF SZ6.5 (GLOVE) ×3 IMPLANT
GLOVE SURG UNDER POLY LF SZ7 (GLOVE) ×9 IMPLANT
GLOVE SURG UNDER POLY LF SZ8 (GLOVE) ×3
GOWN STRL REUS W/ TWL LRG LVL3 (GOWN DISPOSABLE) ×2 IMPLANT
GOWN STRL REUS W/ TWL XL LVL3 (GOWN DISPOSABLE) ×2 IMPLANT
GOWN STRL REUS W/TWL 2XL LVL3 (GOWN DISPOSABLE) IMPLANT
GOWN STRL REUS W/TWL LRG LVL3 (GOWN DISPOSABLE) ×6
GOWN STRL REUS W/TWL XL LVL3 (GOWN DISPOSABLE) ×6
HEMOSTAT POWDER KIT SURGIFOAM (HEMOSTASIS) ×3 IMPLANT
HEMOSTAT SURGICEL 2X14 (HEMOSTASIS) IMPLANT
KIT BASIN OR (CUSTOM PROCEDURE TRAY) ×3 IMPLANT
KIT TURNOVER KIT B (KITS) ×3 IMPLANT
MARKER SKIN DUAL TIP RULER LAB (MISCELLANEOUS) ×3 IMPLANT
NEEDLE HYPO 25X1 1.5 SAFETY (NEEDLE) ×3 IMPLANT
NS IRRIG 1000ML POUR BTL (IV SOLUTION) ×15 IMPLANT
OIL CARTRIDGE MAESTRO DRILL (MISCELLANEOUS) ×3
PACK CRANIOTOMY CUSTOM (CUSTOM PROCEDURE TRAY) ×3 IMPLANT
PAD ARMBOARD 7.5X6 YLW CONV (MISCELLANEOUS) ×6 IMPLANT
PATTIES SURGICAL .5 X.5 (GAUZE/BANDAGES/DRESSINGS) IMPLANT
PATTIES SURGICAL .5 X3 (DISPOSABLE) IMPLANT
PATTIES SURGICAL 1X1 (DISPOSABLE) IMPLANT
PIN MAYFIELD SKULL DISP (PIN) ×3 IMPLANT
SPECIMEN JAR SMALL (MISCELLANEOUS) IMPLANT
SPONGE NEURO XRAY DETECT 1X3 (DISPOSABLE) IMPLANT
SPONGE SURGIFOAM ABS GEL 100 (HEMOSTASIS) ×3 IMPLANT
STAPLER SKIN PROX WIDE 3.9 (STAPLE) ×9 IMPLANT
SUT ETHILON 3 0 PS 1 (SUTURE) IMPLANT
SUT NURALON 4 0 TR CR/8 (SUTURE) ×6 IMPLANT
SUT VIC AB 2-0 CP2 18 (SUTURE) ×15 IMPLANT
SUT VIC AB 3-0 SH 8-18 (SUTURE) ×6 IMPLANT
SYR CONTROL 10ML LL (SYRINGE) IMPLANT
TOWEL GREEN STERILE (TOWEL DISPOSABLE) ×3 IMPLANT
TOWEL GREEN STERILE FF (TOWEL DISPOSABLE) ×3 IMPLANT
TRAY FOLEY MTR SLVR 16FR STAT (SET/KITS/TRAYS/PACK) IMPLANT
UNDERPAD 30X36 HEAVY ABSORB (UNDERPADS AND DIAPERS) ×3 IMPLANT
WATER STERILE IRR 1000ML POUR (IV SOLUTION) ×3 IMPLANT

## 2020-08-09 NOTE — Transfer of Care (Signed)
Immediate Anesthesia Transfer of Care Note  Patient: Regina Morris Texta  Procedure(s) Performed: Decompressive CRANIECTOMY (Right )  Patient Location: PACU and ICU  Anesthesia Type:General  Level of Consciousness: sedated and Patient remains intubated per anesthesia plan  Airway & Oxygen Therapy: Patient remains intubated per anesthesia plan and Patient placed on Ventilator (see vital sign flow sheet for setting)  Post-op Assessment: Report given to RN and Post -op Vital signs reviewed and stable  Post vital signs: Reviewed and stable  Last Vitals:  Vitals Value Taken Time  BP    Temp    Pulse 82 07/30/2020 1613  Resp 17 07/26/2020 1615  SpO2 100 % 08/08/2020 1613  Vitals shown include unvalidated device data.  Last Pain:  Vitals:   08/01/2020 1216  TempSrc: Oral  PainSc:          Complications: No complications documented.

## 2020-08-09 NOTE — Anesthesia Preprocedure Evaluation (Signed)
Anesthesia Evaluation  Patient identified by MRN, date of birth, ID band Patient unresponsive    Reviewed: Allergy & Precautions, NPO status , Patient's Chart, lab work & pertinent test results  Airway Mallampati: II  TM Distance: >3 FB Neck ROM: Full    Dental  (+) Teeth Intact, Dental Advisory Given   Pulmonary neg pulmonary ROS,    Pulmonary exam normal breath sounds clear to auscultation       Cardiovascular hypertension, Normal cardiovascular exam Rhythm:Regular Rate:Normal     Neuro/Psych Seizures -,  R MCA stroke CVA, Residual Symptoms    GI/Hepatic negative GI ROS, Neg liver ROS,   Endo/Other  diabetes, Poorly Controlled, Type 2  Renal/GU negative Renal ROS     Musculoskeletal negative musculoskeletal ROS (+)   Abdominal   Peds  Hematology  (+) Blood dyscrasia, anemia ,   Anesthesia Other Findings Day of surgery medications reviewed with the patient.  Reproductive/Obstetrics                             Anesthesia Physical Anesthesia Plan  ASA: V and emergent  Anesthesia Plan: General   Post-op Pain Management:    Induction: Intravenous  PONV Risk Score and Plan: 3 and Propofol infusion, Ondansetron and Dexamethasone  Airway Management Planned: Oral ETT  Additional Equipment: Arterial line  Intra-op Plan:   Post-operative Plan: Post-operative intubation/ventilation  Informed Consent:     Only emergency history available  Plan Discussed with: CRNA  Anesthesia Plan Comments:         Anesthesia Quick Evaluation

## 2020-08-09 NOTE — Op Note (Signed)
07/21/2020  4:12 PM  PATIENT:  Regina Morris  40 y.o. female  PRE-OPERATIVE DIAGNOSIS: Right hemisphere hemorrhagic Stroke with herniation  POST-OPERATIVE DIAGNOSIS:  Right hemisphere hemorrhagic Stroke with herniation  PROCEDURE:  Procedure(s): Decompressive CRANIECTOMY (Right) with bone flap in abdomen  SURGEON:  Surgeon(s) and Role:    Maeola Harman, MD - Primary  PHYSICIAN ASSISTANT: Julien Girt, NP  ASSISTANTS: none   ANESTHESIA:   general  EBL:  400 mL   BLOOD ADMINISTERED:none  DRAINS: none   LOCAL MEDICATIONS USED:  MARCAINE    and LIDOCAINE   SPECIMEN:  No Specimen  DISPOSITION OF SPECIMEN:  N/A  COUNTS:  YES  TOURNIQUET:  * No tourniquets in log *  DICTATION: Patient is 40 year old woman with large right hemisphere stroke with hemorrhagic conversion and enlarging right pupil consistent with impending herniation.  it was therefore elected to take her to surgery for decompressive craniectomy.  Procedure:  Patient was intubated in OR, patient was placed in left semi-lateral position with blanket roll   Head was placed in pins and right fronto-temporo-parietal scalp was shaved and prepped and draped in usual sterile fashion. The right side of the abdomen was also prepped and draped.   Area of planned incision was infiltrated with lidocaine. A curvilinear incision was made and carried through temporalis fascia and muscle to expose calvarium.  A large craniotomy flap was elevated. Skull flap was elevated exposing dura under tension with dead hemorrhagic brain visible beneath.  Dura was opened and dead brain fungated out through dural defect.  I resected non-viable brain in the fronatl and temporal region and then obtained hemostasis with Surgifoam and irrigation.   Hemostasis was assured.  The dura was placed back over the brain and 3 large sheets of dural matrix graft were placed over the brain.The brain bulged out of craniotomy defect, but there did not appear to be  uncontrollable swelling.  The galea was closed with 2-0 vicryl stitches and the skin was re approximated with staples. Bone flap was then placed in a separate incision in the subcutaneous tissues of the right side of the abdomen and this wound was closed in a similar fashion.  Sterile occlusive dressings were placed.  Sterile occlusive dressings were placed and patient was taken to Neuro ICU still intubated.  She appeared to tolerate her surgery without difficulty.    PLAN OF CARE: Admit to inpatient   PATIENT DISPOSITION:  ICU - intubated and critically ill.   Delay start of Pharmacological VTE agent (>24hrs) due to surgical blood loss or risk of bleeding: yes

## 2020-08-09 NOTE — Progress Notes (Signed)
STROKE TEAM PROGRESS NOTE   INTERVAL HISTORY RN at the bedside. Overnight CT showed worsening midline shift to 67mm, Dr. Derry Lory was informed and repeat CT again 5am this am showed stable midline shift around 8-70mm. Overnight, pt still has agitation and restless, on restrain of right UE to avoid pulling off cortrak. Her HR dropped from previous 70s to now 50s. She has right eye outward deviation and enlarged although still has pupillary reflexes. Left sided hemiparesis but withdraw to pain. Able to protect airway now. On 3% saline with Na 156 now. EEG showed potential seizure focus, now on keppra. Discussed with significant other via interpretation of her daughter (78 yo), about potential decompressive hemicrani, they are in agreement to proceed. I then talked with Dr. Venetia Maxon and he will plan for surgery after talking to family.  Vitals:   2020-08-28 0700 28-Aug-2020 0800 2020-08-28 0900 Aug 28, 2020 1000  BP:  (!) 147/70 132/70 131/73  Pulse:  76 69 60  Resp:  20 19 20   Temp: 99.3 F (37.4 C)     TempSrc: Oral     SpO2:  96% 98% 96%  Weight:      Height:       CBC:  Recent Labs  Lab 08/08/20 0253 2020-08-28 0250  WBC 17.6* 16.4*  HGB 8.5* 8.6*  HCT 32.5* 32.2*  MCV 61.7* 62.0*  PLT 549* 524*   Basic Metabolic Panel:  Recent Labs  Lab 08/08/20 0253 08/08/20 0939 Aug 28, 2020 0250 08-28-2020 0914  NA 143   < > 154* 156*  K 3.6  --  4.2  --   CL 113*  --  119*  --   CO2 20*  --  21*  --   GLUCOSE 194*  --  182*  --   BUN 10  --  5*  --   CREATININE 0.53  --  0.51  --   CALCIUM 8.2*  --  8.9  --   MG 2.0  --   --   --   PHOS 1.9*  --   --   --    < > = values in this interval not displayed.   Lipid Panel:  Recent Labs  Lab 08/01/2020 0755  CHOL 276*  TRIG 168*  HDL 45  CHOLHDL 6.1  VLDL 34  LDLCALC 08/10/2020*   HgbA1c:  Recent Labs  Lab 07/23/2020 0755  HGBA1C 12.9*   Urine Drug Screen:  Recent Labs  Lab 07/26/2020 1258  LABOPIA NONE DETECTED  COCAINSCRNUR NONE DETECTED  LABBENZ  NONE DETECTED  AMPHETMU NONE DETECTED  THCU NONE DETECTED  LABBARB NONE DETECTED    Alcohol Level  Recent Labs  Lab 08/06/2020 0424  ETH <10    IMAGING  CT ABDOMEN PELVIS WO CONTRAST Result Date: 08/12/2020 IMPRESSION: No acute intra-abdominal pathology identified.   DG Chest 2 View Result Date: 07/30/2020 MPRESSION: No active cardiopulmonary disease.   CT Head Wo Contrast Result Date: 08/01/2020 IMPRESSION: Large posterior right MCA territory infarct involving the posterior right temporal cortex with superimposed moderate subarachnoid hemorrhage interdigitating within the sulci of the infarcted region and tiny subdural hematoma. Mild mass effect. No midline shift.   MR ANGIO HEAD WO CONTRAST Result Date: 08/10/2020 IMPRESSION:  1. Significantly degraded by motion.  2. Narrowed/irregularity right M2 branches correlating with the acute infarct. No detectable arterial disease elsewhere.   MR BRAIN WO CONTRAST Result Date: 07/27/2020 CT. IMPRESSION:  1. Acute infarct involving the inferior division right MCA territory with petechial hemorrhage and 4  mm midline shift.  2. Diffusion only study due to patient condition.    PHYSICAL EXAM  Temp:  [98.8 F (37.1 C)-100 F (37.8 C)] 99.3 F (37.4 C) (05/28 0700) Pulse Rate:  [55-91] 60 (05/28 1000) Resp:  [14-26] 20 (05/28 1000) BP: (120-153)/(65-92) 131/73 (05/28 1000) SpO2:  [96 %-100 %] 96 % (05/28 1000) Weight:  [71 kg] 71 kg (05/28 0500)  General - Well nourished, well developed, restless.  Ophthalmologic - fundi not visualized due to noncooperation.  Cardiovascular - Regular rate but mild bradycardia.  Neuro - lethargic and restelss, eyes open with repetitive stimulation, nonverbal and not following simple commands. Left eye mid position, right eye outward gaze, not blinking to visual threat bilaterally, right pupil 31mm->3mm, left pupil 3.96mm->2mm. Left nasolabial fold flattening. Tongue protrusion not cooperative. RUE  and RLE spontaneously movement, strong. LUE and LLE withdraw to pain 2/5. Sensation, coordination not cooperative and gait not tested.    ASSESSMENT/PLAN Regina Morris is a 40 y.o. female with history of gestational diabetes, kidney stone  presenting with nausea, vomiting and R temple headache.  CT head demonstrated a right posterior MCA stroke with moderate subarachnoid hemorrhage interdigitating within the sulci of the infarcted region and tiny SDH with mild mass effect.   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   MRA Narrowed/irregularity right M2 branches correlating with the acute infarct.  Carotid Doppler unremarkable  2D Echo EF 60-65%   SARs coronavirus negative  Hypercoag work up pending  TCD bubble study no PFO  LDL 197  HgbA1c 11.7  VTE prophylaxis - SCDs  No antithrombotic/anticoagulant prior to admission, no antithrombotics now due to hemorrhagic conversion  Therapy recommendations:  pending  Disposition:  Pending  Cerebral Edema Uncal herniation  CT repeat showed MLS worsened to 8-25mm  Exam concerning for early uncal herniation on the right  On HTS at 75cc/hr   Na 135->136->140->143->149->154->156  Discussed with Dr. Venetia Maxon NSG, will [facility]  Potential seizure  EEG potential seizure focus on the right  On Keppra 500mg  bid  Seizure precautions  Hypertension  Home meds:  none  Stable . SBP goal < 160 due to hemorrhagic conversion . Long-term BP goal normotensive  Hyperlipidemia  Home meds:  none  LDL 197 goal < 70  On lipitor 80  Continue statin on discharge  Diabetes, uncontrolled   Home meds:  Metformin 500mg  daily  HgbA1c 11.7, goal < 7.0  CBGs  SSI  Diabetes coordinator  consult  Leukocytosis Low grade fever  WBC 20->17.5->16.4  Tmax 99.9->100, monitor fever curve  UA pending  CXR pending  Urinary Retention  Improving, continue to monitor with serial bladder scans q 6 hours  Other Stroke Risk Factors    Other Active Problems  Thrombocytosis platelet 715->524  Anemia Hb 9.3->8.6  This patient is critically ill due to large right MCA infarct, cerebral edema, seizure, uncontrolled DM, fever, leukocytosis and at significant risk of neurological worsening, death form brain herniation, hemorrhagic conversion, status epilepticus, DKA, sepsis. This patient's care requires constant monitoring of vital signs, hemodynamics, respiratory and cardiac monitoring, review of multiple databases, neurological assessment, discussion with family, other specialists and medical decision making of high complexity. I spent 45 minutes of neurocritical care time in the care of this patient. I had long discussion  with pt significant other over the phone via daughter as interpreter, updated pt current condition, treatment plan and potential prognosis, and answered all the questions. He expressed understanding and appreciation. I also discussed with Dr. Venetia Maxon NSG.  Marvel Plan, MD PhD Stroke Neurology 24-Aug-2020 11:34 AM   To contact Stroke Continuity provider, please refer to [email or web address]. After hours, contact General Neurology

## 2020-08-09 NOTE — Consult Note (Addendum)
NAME:  Regina Morris, MRN:  [id number], DOB:  November 24, 1980, LOS: 2 ADMISSION DATE:  07/25/2020, CONSULTATION DATE:  07/18/2020 REFERRING MD:  Stroke, Md, MD, CHIEF COMPLAINT:  Respiratory failure, tsroke  History of Present Illness:  Regina Morris is a 40 y.o. woman who presented with headache nausea and vomiting to [facility] on 5/26. 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 Transferred to [facility]. Started on keppra. Mental status worsened 5/27 evening and the morning of 5/28 CT head showed worsening midline shift. She was taken to the OR for decompressive hemicraniotomy with a pupillar exam concerning for impending herniation. She had rsection of non-viable brain tissue during hemicraniotomy and a bone flap was placed when she was closed.   History is obtained from chart review as the patient is intubated and not able to provide response.   Pertinent  Medical History  Gestational diabetes Nephrolithiasis  Significant Hospital Events: Including procedures, antibiotic start and stop dates in addition to other pertinent events   . 5/26 Presented to [facility] with headache, nausea, vomiting . 5/26 transferred to [facility], started on keppra and HTS . 5/27 possible neurologic decline, repeat CT head shows stability of bleeding, but increase in edema . 5/27 EEG epileptogenecity in the right frontal region and cortical dysfunction of the right hemisphere, no seizures . 5/27 cortrak placed . 5/28 overnight worsening midline shift and change in neuro exam, plan for decompressive hemicraniotomy  Interim History / Subjective:    Objective   Blood pressure 110/64, pulse 90, temperature 97.9 F (36.6 C), temperature source Axillary, resp. rate 19, height 5\' 6"  (1.676 m), weight 71 kg, last menstrual period 08/05/2020, SpO2 100 %.    Vent Mode: PRVC FiO2 (%):  [30 %-100 %] 30 % Set  Rate:  [16 bmp] 16 bmp Vt Set:  [470 mL] 470 mL PEEP:  [5 cmH20] 5 cmH20 Plateau Pressure:  [14 cmH20] 14 cmH20   Intake/Output Summary (Last 24 hours) at 08/01/2020 1716 Last data filed at 07/17/2020 1549 Gross per 24 hour  Intake 4309.2 ml  Output 2650 ml  Net 1659.2 ml   Filed Weights   08/04/2020 1400 07/16/2020 0500 07/28/2020 1216  Weight: 73.7 kg 71 kg 71 kg    Examination: General: intubated, sedated HENT: right hemicraniotomy with bone flap in place Lungs: ctab no wheezes or crackles Cardiovascular:tachycardic, regular Abdomen: soft, nontender Extremities: no edema Neuro: not responsive  Labs/imaging that I havepersonally reviewed  (right click and "Reselect all SmartList Selections" daily)  CT head personally reviewed from today right MCA infarct with hemorrhage with midline shift  Resolved Hospital Problem list     Assessment & Plan:   Acute right MCA territory hemorrhage with edema, midline shift POD #0 right decompressive hemicraniotomy with bone flap Acute hypoxemic respiratory failure - Returned to ICU intubated post-op. Maintain lung protective ventilation - Na goal 150-155, currently 157 - start nicardipine goal SBP<160 - start fentanyl instead of propofol  Acute blood loss anemia -Acute illness, intracranial hemorrhage with post-operative losses -transfuse goal>7  I have updated the patient's husband and 21 year old daughter at the bedside with the assistance of an interpreter. Will have neurosurgery team come as well.  Very poor prognosis Will need to consult palliative in am   Best practice (right click and "Reselect all SmartList Selections" daily)  Diet:  Tube Feed  Pain/Anxiety/Delirium protocol (  if indicated): Yes (RASS goal -1) VAP protocol (if indicated): Yes DVT prophylaxis: Contraindicated GI prophylaxis: H2B Glucose control:  SSI Yes Central venous access:  N/A Arterial line:  Yes, and it is still needed Foley:  Yes, and it is still  needed Mobility:  bed rest  PT consulted: N/A Last date of multidisciplinary goals of care discussion []  Code Status:  full code Disposition: ICU  Labs   CBC: Recent Labs  Lab 08/06/20 2048 08/08/20 0253 08/24/2020 0250 2020-08-24 1654  WBC 20.5* 17.6* 16.4*  --   HGB 9.3* 8.5* 8.6* 6.8*  HCT 34.2* 32.5* 32.2* 20.0*  MCV 60.4* 61.7* 62.0*  --   PLT 715* 549* 524*  --     Basic Metabolic Panel: Recent Labs  Lab 08/06/20 2048 08/05/2020 1520 08/08/20 0253 08/08/20 0939 08/08/20 1456 08/08/20 2103 Aug 24, 2020 0250 08-24-2020 0914 2020/08/24 1654  NA 135   < > 143   < > 150* 149* 154* 156* 157*  K 3.7  --  3.6  --   --   --  4.2  --  3.4*  CL 100  --  113*  --   --   --  119*  --   --   CO2 20*  --  20*  --   --   --  21*  --   --   GLUCOSE 290*  --  194*  --   --   --  182*  --   --   BUN 9  --  10  --   --   --  5*  --   --   CREATININE 0.61  --  0.53  --   --   --  0.51  --   --   CALCIUM 8.6*  --  8.2*  --   --   --  8.9  --   --   MG  --   --  2.0  --   --   --   --   --   --   PHOS  --   --  1.9*  --   --   --   --   --   --    < > = values in this interval not displayed.   GFR: Estimated Creatinine Clearance: 89.3 mL/min (by C-G formula based on SCr of 0.51 mg/dL). Recent Labs  Lab 08/06/20 2048 08/08/20 0253 24-Aug-2020 0250  PROCALCITON <0.10  --   --   WBC 20.5* 17.6* 16.4*    Liver Function Tests: Recent Labs  Lab 08/06/20 2048  AST 16  ALT 13  ALKPHOS 89  BILITOT 1.1  PROT 7.7  ALBUMIN 3.5   Recent Labs  Lab 08/06/20 2048  LIPASE 21   No results for input(s): AMMONIA in the last 168 hours.  ABG    Component Value Date/Time   PHART 7.422 Aug 24, 2020 1654   PCO2ART 30.1 (L) 08/24/2020 1654   PO2ART 480 (H) Aug 24, 2020 1654   HCO3 19.5 (L) 2020/08/24 1654   TCO2 20 (L) 24-Aug-2020 1654   ACIDBASEDEF 4.0 (H) 2020-08-24 1654   O2SAT 100.0 24-Aug-2020 1654     Coagulation Profile: Recent Labs  Lab 07/25/2020 0410  INR 1.1    Cardiac  Enzymes: No results for input(s): CKTOTAL, CKMB, CKMBINDEX, TROPONINI in the last 168 hours.  HbA1C: Hgb A1c MFr Bld  Date/Time Value Ref Range Status  07/31/2020 07:55 AM 12.9 (H) 4.8 - 5.6 % Final  Comment:    (NOTE)         Prediabetes: 5.7 - 6.4         Diabetes: >6.4         Glycemic control for adults with diabetes: <7.0   12/12/2019 09:57 PM 11.7 (H) 4.8 - 5.6 % Final    Comment:    (NOTE) Pre diabetes:          5.7%-6.4%  Diabetes:              >6.4%  Glycemic control for   <7.0% adults with diabetes     CBG: Recent Labs  Lab 08/08/20 2354 07/18/2020 0331 08/05/2020 0747 07/23/2020 1135 07/27/2020 1708  GLUCAP 220* 160* 180* 172* 245*    Review of Systems:   Unable to obtain  Past Medical History:  She,  has a past medical history of Gestational diabetes and Kidney stone.   Surgical History:  History reviewed. No pertinent surgical history.   Social History:   reports that she has never smoked. She has never used smokeless tobacco. She reports that she does not drink alcohol and does not use drugs.   Family History:  Her family history includes Diabetes in her maternal grandfather; Uterine cancer in her mother.   Allergies Allergies  Allergen Reactions  . Penicillins Rash     Home Medications  Prior to Admission medications   Medication Sig Start Date End Date Taking? Authorizing Provider  ciprofloxacin (CIPRO) 500 MG tablet TOMAR UNA TABLETA POR BOCA DOS VECES AL DIA POR 7 DIAS 12/14/19 12/13/20  Lynn Ito, MD  ferrous sulfate 325 (65 FE) MG tablet Take 1 tablet (325 mg total) by mouth daily with breakfast. 12/16/19 01/15/20  Lynn Ito, MD  metFORMIN (GLUCOPHAGE) 500 MG tablet Take 1 tablet (500 mg total) by mouth daily with breakfast. 12/15/19 01/14/20  Lynn Ito, MD     Critical care time: 65    The patient is critically ill due to respiratory failure, brain herniation, subarachnoid hemorrhage.  Critical care was necessary to treat or  prevent imminent or life-threatening deterioration.  Critical care was time spent personally by me on the following activities: development of treatment plan with patient and/or surrogate as well as nursing, discussions with consultants, evaluation of patient's response to treatment, examination of patient, obtaining history from patient or surrogate, ordering and performing treatments and interventions, ordering and review of laboratory studies, ordering and review of radiographic studies, pulse oximetry, re-evaluation of patient's condition and participation in multidisciplinary rounds.   Critical Care Time devoted to patient care services described in this note is 38 minutes. This time reflects time of care of this signee Charlott Holler . This critical care time does not reflect separately billable procedures or procedure time, teaching time or supervisory time of PA/NP/Med student/Med Resident etc but could involve care discussion time.       Charlott Holler [facility] 07/29/2020 5:27 PM  Pager: see AMION  If no response to pager , please call critical care on call (see AMION) until 7pm After 7:00 pm call Elink

## 2020-08-09 NOTE — Plan of Care (Signed)
  Problem: Safety: Goal: Non-violent Restraint(s) Outcome: Progressing   Problem: Safety: Goal: Non-violent Restraint(s) 08/17/2020 2128 by Laurann Montana, RN Outcome: Progressing 08/17/2020 2128 by Laurann Montana, RN Outcome: Progressing

## 2020-08-09 NOTE — Anesthesia Postprocedure Evaluation (Signed)
Anesthesia Post Note  Patient: Regina Morris Texta  Procedure(s) Performed: Decompressive CRANIECTOMY (Right )     Patient location during evaluation: SICU Anesthesia Type: General Level of consciousness: sedated Pain management: pain level controlled Vital Signs Assessment: post-procedure vital signs reviewed and stable Respiratory status: patient remains intubated per anesthesia plan Cardiovascular status: stable Postop Assessment: no apparent nausea or vomiting Anesthetic complications: no   No complications documented.  Last Vitals:  Vitals:   07/18/2020 1700 08/03/2020 1900  BP: 110/64 (!) 91/53  Pulse: 90 82  Resp: 19 18  Temp: 36.6 C   SpO2: 100% 100%    Last Pain:  Vitals:   07/22/2020 1700  TempSrc: Axillary  PainSc:                  Cecile Hearing

## 2020-08-09 NOTE — Brief Op Note (Signed)
08/08/2020  4:12 PM  PATIENT:  Regina Morris  39 y.o. female  PRE-OPERATIVE DIAGNOSIS: Right hemisphere hemorrhagic Stroke with herniation  POST-OPERATIVE DIAGNOSIS:  Right hemisphere hemorrhagic Stroke with herniation  PROCEDURE:  Procedure(s): Decompressive CRANIECTOMY (Right) with bone flap in abdomen  SURGEON:  Surgeon(s) and Role:    * Mykel Sponaugle, MD - Primary  PHYSICIAN ASSISTANT: McDaniel, NP  ASSISTANTS: none   ANESTHESIA:   general  EBL:  400 mL   BLOOD ADMINISTERED:none  DRAINS: none   LOCAL MEDICATIONS USED:  MARCAINE    and LIDOCAINE   SPECIMEN:  No Specimen  DISPOSITION OF SPECIMEN:  N/A  COUNTS:  YES  TOURNIQUET:  * No tourniquets in log *  DICTATION: Patient is 39 year old woman with large right hemisphere stroke with hemorrhagic conversion and enlarging right pupil consistent with impending herniation.  it was therefore elected to take her to surgery for decompressive craniectomy.  Procedure:  Patient was intubated in OR, patient was placed in left semi-lateral position with blanket roll   Head was placed in pins and right fronto-temporo-parietal scalp was shaved and prepped and draped in usual sterile fashion. The right side of the abdomen was also prepped and draped.   Area of planned incision was infiltrated with lidocaine. A curvilinear incision was made and carried through temporalis fascia and muscle to expose calvarium.  A large craniotomy flap was elevated. Skull flap was elevated exposing dura under tension with dead hemorrhagic brain visible beneath.  Dura was opened and dead brain fungated out through dural defect.  I resected non-viable brain in the fronatl and temporal region and then obtained hemostasis with Surgifoam and irrigation.   Hemostasis was assured.  The dura was placed back over the brain and 3 large sheets of dural matrix graft were placed over the brain.The brain bulged out of craniotomy defect, but there did not appear to be  uncontrollable swelling.  The galea was closed with 2-0 vicryl stitches and the skin was re approximated with staples. Bone flap was then placed in a separate incision in the subcutaneous tissues of the right side of the abdomen and this wound was closed in a similar fashion.  Sterile occlusive dressings were placed.  Sterile occlusive dressings were placed and patient was taken to Neuro ICU still intubated.  She appeared to tolerate her surgery without difficulty.    PLAN OF CARE: Admit to inpatient   PATIENT DISPOSITION:  ICU - intubated and critically ill.   Delay start of Pharmacological VTE agent (>24hrs) due to surgical blood loss or risk of bleeding: yes  

## 2020-08-09 NOTE — Anesthesia Procedure Notes (Signed)
Procedure Name: Intubation Date/Time: 07/17/2020 1:11 PM Performed by: Trinna Post., CRNA Pre-anesthesia Checklist: Patient identified, Emergency Drugs available, Suction available, Patient being monitored and Timeout performed Patient Re-evaluated:Patient Re-evaluated prior to induction Oxygen Delivery Method: Circle system utilized Preoxygenation: Pre-oxygenation with 100% oxygen Induction Type: IV induction Ventilation: Mask ventilation without difficulty Laryngoscope Size: Mac and 3 Grade View: Grade I Tube type: Oral Tube size: 7.0 mm Number of attempts: 1 Airway Equipment and Method: Stylet Placement Confirmation: ETT inserted through vocal cords under direct vision,  positive ETCO2 and breath sounds checked- equal and bilateral Secured at: 23 cm Tube secured with: Tape Dental Injury: Teeth and Oropharynx as per pre-operative assessment

## 2020-08-09 NOTE — Consult Note (Signed)
Reason for Consult: Cerebral edema Referring Physician: Marvel Plan, MD   HPI: Regina Morris is an 40 year old female with right MCA infarct with hemorrhagic transformation with worsening mental status and increasing cerebral edema and MLS. Neurosurgery was contacted to evaluate patient for potential decompressive hemicraniectomy.   Past Medical History:  Diagnosis Date  . Gestational diabetes   . Kidney stone     History reviewed. No pertinent surgical history.  Family History  Problem Relation Age of Onset  . Uterine cancer Mother   . Diabetes Maternal Grandfather     Social History:  reports that she has never smoked. She has never used smokeless tobacco. She reports that she does not drink alcohol and does not use drugs.  Allergies:  Allergies  Allergen Reactions  . Penicillins Rash    Medications: I have reviewed the patient's current medications.  Results for orders placed or performed during the hospital encounter of 07/29/2020 (from the past 48 hour(s))  Rapid urine drug screen (hospital performed)     Status: None   Collection Time: 08/06/2020 12:58 PM  Result Value Ref Range   Opiates NONE DETECTED NONE DETECTED   Cocaine NONE DETECTED NONE DETECTED   Benzodiazepines NONE DETECTED NONE DETECTED   Amphetamines NONE DETECTED NONE DETECTED   Tetrahydrocannabinol NONE DETECTED NONE DETECTED   Barbiturates NONE DETECTED NONE DETECTED    Comment: (NOTE) DRUG SCREEN FOR MEDICAL PURPOSES ONLY.  IF CONFIRMATION IS NEEDED FOR ANY PURPOSE, NOTIFY LAB WITHIN 5 DAYS.  LOWEST DETECTABLE LIMITS FOR URINE DRUG SCREEN Drug Class                     Cutoff (ng/mL) Amphetamine and metabolites    1000 Barbiturate and metabolites    200 Benzodiazepine                 200 Tricyclics and metabolites     300 Opiates and metabolites        300 Cocaine and metabolites        300 THC                            50 Performed at [facility], [address]  [place], [place] [address]   MRSA PCR Screening     Status: None   Collection Time: 08/11/2020  2:08 PM   Specimen: Nasal Mucosa; Nasopharyngeal  Result Value Ref Range   MRSA by PCR NEGATIVE NEGATIVE    Comment:        The GeneXpert MRSA Assay (FDA approved for NASAL specimens only), is one component of a comprehensive MRSA colonization surveillance program. It is not intended to diagnose MRSA infection nor to guide or monitor treatment for MRSA infections. Performed at [facility], [address] [place], [place] [address]   Glucose, capillary     Status: Abnormal   Collection Time: 07/26/2020  2:11 PM  Result Value Ref Range   Glucose-Capillary 238 (H) 70 - 99 mg/dL    Comment: Glucose reference range applies only to samples taken after fasting for at least 8 hours.  Sedimentation rate     Status: Abnormal   Collection Time: 07/15/2020  2:58 PM  Result Value Ref Range   Sed Rate 26 (H) 0 - 22 mm/hr    Comment: Performed at [facility], [address] [place], [place] [address]  Sodium     Status:  None   Collection Time: 07/30/2020  3:20 PM  Result Value Ref Range   Sodium 136 135 - 145 mmol/L    Comment: Performed at [facility], [address], [place], [place] [address]  Glucose, capillary     Status: Abnormal   Collection Time: 08/11/2020  4:25 PM  Result Value Ref Range   Glucose-Capillary 236 (H) 70 - 99 mg/dL    Comment: Glucose reference range applies only to samples taken after fasting for at least 8 hours.  Glucose, capillary     Status: Abnormal   Collection Time: 07/29/2020  7:48 PM  Result Value Ref Range   Glucose-Capillary 180 (H) 70 - 99 mg/dL    Comment: Glucose reference range applies only to samples taken after fasting for at least 8 hours.  Sodium     Status: None   Collection Time: 07/31/2020  8:27 PM  Result Value Ref Range   Sodium 140 135 - 145 mmol/L    Comment: Performed at [facility], [address], [place], [place] [address]   Glucose, capillary     Status: Abnormal   Collection Time: 08/08/20 12:04 AM  Result Value Ref Range   Glucose-Capillary 208 (H) 70 - 99 mg/dL    Comment: Glucose reference range applies only to samples taken after fasting for at least 8 hours.   Comment 1 Notify RN   CBC     Status: Abnormal   Collection Time: 08/08/20  2:53 AM  Result Value Ref Range   WBC 17.6 (H) 4.0 - 10.5 K/uL   RBC 5.27 (H) 3.87 - 5.11 MIL/uL   Hemoglobin 8.5 (L) 12.0 - 15.0 g/dL    Comment: Reticulocyte Hemoglobin testing may be clinically indicated, consider ordering this additional test [id number]    HCT 32.5 (L) 36.0 - 46.0 %   MCV 61.7 (L) 80.0 - 100.0 fL   MCH 16.1 (L) 26.0 - 34.0 pg   MCHC 26.2 (L) 30.0 - 36.0 g/dL   RDW 78.2 (H) 95.6 - 21.3 %   Platelets 549 (H) 150 - 400 K/uL    Comment: REPEATED TO VERIFY   nRBC 0.0 0.0 - 0.2 %    Comment: Performed at [facility], [address] [place], [place] [address]  Basic metabolic panel     Status: Abnormal   Collection Time: 08/08/20  2:53 AM  Result Value Ref Range   Sodium 143 135 - 145 mmol/L   Potassium 3.6 3.5 - 5.1 mmol/L   Chloride 113 (H) 98 - 111 mmol/L   CO2 20 (L) 22 - 32 mmol/L   Glucose, Bld 194 (H) 70 - 99 mg/dL    Comment: Glucose reference range applies only to samples taken after fasting for at least 8 hours.   BUN 10 6 - 20 mg/dL   Creatinine, Ser 8.46 0.44 - 1.00 mg/dL   Calcium 8.2 (L) 8.9 - 10.3 mg/dL   GFR, Estimated >96 >29 mL/min    Comment: (NOTE) Calculated using the CKD-EPI Creatinine Equation (2021)    Anion gap 10 5 - 15    Comment: Performed at [facility], [address], [place], [place] [address]  Magnesium     Status: None   Collection Time: 08/08/20  2:53 AM  Result Value Ref Range   Magnesium 2.0 1.7 - 2.4 mg/dL    Comment: Performed at [facility], [address] [place], [place] [address]  Phosphorus  Status: Abnormal   Collection Time: 08/08/20  2:53 AM  Result Value Ref  Range   Phosphorus 1.9 (L) 2.5 - 4.6 mg/dL    Comment: Performed at [facility], [address] [place], [place] [address]  Glucose, capillary     Status: Abnormal   Collection Time: 08/08/20  4:05 AM  Result Value Ref Range   Glucose-Capillary 178 (H) 70 - 99 mg/dL    Comment: Glucose reference range applies only to samples taken after fasting for at least 8 hours.   Comment 1 Notify RN   Glucose, capillary     Status: Abnormal   Collection Time: 08/08/20  7:30 AM  Result Value Ref Range   Glucose-Capillary 165 (H) 70 - 99 mg/dL    Comment: Glucose reference range applies only to samples taken after fasting for at least 8 hours.  Sodium     Status: Abnormal   Collection Time: 08/08/20  9:39 AM  Result Value Ref Range   Sodium 148 (H) 135 - 145 mmol/L    Comment: Performed at [facility], [address], [place], [place] [address]  Glucose, capillary     Status: Abnormal   Collection Time: 08/08/20 11:08 AM  Result Value Ref Range   Glucose-Capillary 188 (H) 70 - 99 mg/dL    Comment: Glucose reference range applies only to samples taken after fasting for at least 8 hours.  Sodium     Status: Abnormal   Collection Time: 08/08/20  2:56 PM  Result Value Ref Range   Sodium 150 (H) 135 - 145 mmol/L    Comment: Performed at [facility], [address] [place], [place] [address]  Glucose, capillary     Status: Abnormal   Collection Time: 08/08/20  3:53 PM  Result Value Ref Range   Glucose-Capillary 164 (H) 70 - 99 mg/dL    Comment: Glucose reference range applies only to samples taken after fasting for at least 8 hours.  Glucose, capillary     Status: Abnormal   Collection Time: 08/08/20  7:56 PM  Result Value Ref Range   Glucose-Capillary 195 (H) 70 - 99 mg/dL    Comment: Glucose reference range applies only to samples taken after fasting for at least 8 hours.  Sodium     Status: Abnormal   Collection Time: 08/08/20  9:03 PM  Result Value Ref Range   Sodium  149 (H) 135 - 145 mmol/L    Comment: Performed at [facility], [address], [place], [place] [address]  Glucose, capillary     Status: Abnormal   Collection Time: 08/08/20 11:54 PM  Result Value Ref Range   Glucose-Capillary 220 (H) 70 - 99 mg/dL    Comment: Glucose reference range applies only to samples taken after fasting for at least 8 hours.  CBC     Status: Abnormal   Collection Time: 07/25/2020  2:50 AM  Result Value Ref Range   WBC 16.4 (H) 4.0 - 10.5 K/uL   RBC 5.19 (H) 3.87 - 5.11 MIL/uL   Hemoglobin 8.6 (L) 12.0 - 15.0 g/dL    Comment: Reticulocyte Hemoglobin testing may be clinically indicated, consider ordering this additional test [id number]    HCT 32.2 (L) 36.0 - 46.0 %   MCV 62.0 (L) 80.0 - 100.0 fL   MCH 16.6 (L) 26.0 - 34.0 pg   MCHC 26.7 (L) 30.0 - 36.0 g/dL   RDW 86.5 (H) 78.4 - 69.6 %  Platelets 524 (H) 150 - 400 K/uL    Comment: REPEATED TO VERIFY   nRBC 0.0 0.0 - 0.2 %    Comment: Performed at [facility], [address] [place], [place] [address]  Basic metabolic panel     Status: Abnormal   Collection Time: 07/31/2020  2:50 AM  Result Value Ref Range   Sodium 154 (H) 135 - 145 mmol/L   Potassium 4.2 3.5 - 5.1 mmol/L   Chloride 119 (H) 98 - 111 mmol/L   CO2 21 (L) 22 - 32 mmol/L   Glucose, Bld 182 (H) 70 - 99 mg/dL    Comment: Glucose reference range applies only to samples taken after fasting for at least 8 hours.   BUN 5 (L) 6 - 20 mg/dL   Creatinine, Ser 3.25 0.44 - 1.00 mg/dL   Calcium 8.9 8.9 - 49.8 mg/dL   GFR, Estimated >26 >41 mL/min    Comment: (NOTE) Calculated using the CKD-EPI Creatinine Equation (2021)    Anion gap 14 5 - 15    Comment: Performed at [facility], [address], [place], [place] [address]  Glucose, capillary     Status: Abnormal   Collection Time: 07/26/2020  3:31 AM  Result Value Ref Range   Glucose-Capillary 160 (H) 70 - 99 mg/dL    Comment: Glucose reference range applies only to samples taken  after fasting for at least 8 hours.  Glucose, capillary     Status: Abnormal   Collection Time: 07/24/2020  7:47 AM  Result Value Ref Range   Glucose-Capillary 180 (H) 70 - 99 mg/dL    Comment: Glucose reference range applies only to samples taken after fasting for at least 8 hours.  Sodium     Status: Abnormal   Collection Time: 07/13/2020  9:14 AM  Result Value Ref Range   Sodium 156 (H) 135 - 145 mmol/L    Comment: Performed at [facility], [address], [place], [place] [address]  Glucose, capillary     Status: Abnormal   Collection Time: 08/10/2020 11:35 AM  Result Value Ref Range   Glucose-Capillary 172 (H) 70 - 99 mg/dL    Comment: Glucose reference range applies only to samples taken after fasting for at least 8 hours.    CT HEAD WO CONTRAST  Result Date: 07/18/2020 CLINICAL DATA:  Cerebral hemorrhage follow-up EXAM: CT HEAD WITHOUT CONTRAST TECHNIQUE: Contiguous axial images were obtained from the base of the skull through the vertex without intravenous contrast. COMPARISON:  Yesterday FINDINGS: Brain: Cytotoxic edema from infarct in the inferior division right MCA territory with unchanged superficial hemorrhage. Minimal subarachnoid hemorrhage at the right parietal convexity. Midline shift measures 8 mm. No entrapment suspected. No evidence of new infarct. Vascular: Negative Skull: Negative Sinuses/Orbits: Negative IMPRESSION: Unchanged hemorrhagic right MCA territory infarct. Midline shift is unchanged at 8 mm. Electronically Signed   By: Marnee Spring M.D.   On: 08/08/2020 05:31   CT HEAD WO CONTRAST  Result Date: 08/08/2020 CLINICAL DATA:  Acute stroke, follow-up evaluation EXAM: CT HEAD WITHOUT CONTRAST TECHNIQUE: Contiguous axial images were obtained from the base of the skull through the vertex without intravenous contrast. COMPARISON:  08/08/2020 at 7:37 p.m. FINDINGS: Brain: There is a grossly stable right MCA territory hemorrhagic infarct, with continued edema and  internal hemorrhage unchanged since prior study. There is persistent mass effect, with leftward midline shift measuring 9 mm at the level of the septum pellucidum unchanged since prior  study. Continued effacement of the right lateral ventricle. Stable mild dilation of the temporal and occipital horns of the left lateral ventricle. Basilar cisterns remain patent. Vascular: No hyperdense vessel or unexpected calcification. Skull: Normal. Negative for fracture or focal lesion. Sinuses/Orbits: Minimal fluid within the right mastoid air cells. Mild mucoperiosteal thickening in the ethmoid air cells unchanged. Other: None. IMPRESSION: 1. Grossly stable hemorrhagic right MCA territory infarct, with similar mass effect and leftward midline shift unchanged since prior exam. Electronically Signed   By: Sharlet Salina M.D.   On: 08/08/2020 23:56   CT HEAD WO CONTRAST  Result Date: 08/08/2020 CLINICAL DATA:  Follow-up examination for acute stroke. EXAM: CT HEAD WITHOUT CONTRAST TECHNIQUE: Contiguous axial images were obtained from the base of the skull through the vertex without intravenous contrast. COMPARISON:  Prior CT from earlier the same day performed at 2:42 a.m. FINDINGS: Brain: Continued interval evolution of right MCA distribution infarct, overall relatively similar in size as compared to previous exam. Associated hemorrhagic transformation with internal hemorrhage, relatively similar. No evidence for interval bleeding. Associated mass effect has slightly worsened, with minimally increased right-to-left shift now measuring up to 9 mm, previously 8 mm. Additionally, there is slightly increased dilatation of the atrium and temporal horn of the left lateral ventricle as compared to previous trace periventricular hypodensity about the left atrium suggest a degree of transependymal flow of CSF, new and/or more conspicuous as compared to previous exam (series 3, image 16). Basilar cisterns are crowded but remain patent.  No transtentorial herniation. Trace extra-axial extension of blood products overlying the right frontal convexity again noted, measuring no more than 2-3 mm in maximal thickness, relatively unchanged. No significant mass effect. No other new acute large vessel territory infarct or hemorrhage. Remainder the brain otherwise unremarkable. Vascular: No hyperdense vessel. Skull: Scalp soft tissues and calvarium demonstrate no new abnormality. Sinuses/Orbits: Globes and orbital soft tissues within normal limits. Nasogastric tube in place. Paranasal sinuses are largely clear. Small right mastoid effusion noted. Other: None. IMPRESSION: 1. Continued interval evolution of hemorrhagic right MCA distribution infarct, overall relatively similar in size as compared to previous exam. Associated mass effect has slightly worsened, with minimally increased right-to-left midline shift now measuring up to 9 mm, previously 8 mm. 2. Slightly increased dilatation of the atrium and temporal horn of the left lateral ventricle as compared to previous. Mild periventricular hypodensity about the left atrium suggests a degree of transependymal flow of CSF. 3. No other new acute intracranial abnormality. Electronically Signed   By: Rise Mu M.D.   On: 08/08/2020 19:57   CT HEAD WO CONTRAST  Result Date: 08/08/2020 CLINICAL DATA:  Follow-up examination for stroke. EXAM: CT HEAD WITHOUT CONTRAST TECHNIQUE: Contiguous axial images were obtained from the base of the skull through the vertex without intravenous contrast. COMPARISON:  Prior CT from 07/15/2020 at 8:17 p.m. FINDINGS: Brain: Continued interval evolution of right MCA distribution infarct, similar in size with slightly more well-defined and hypodense as compared to previous exam. Internal hemorrhage within the area of infarction is relatively similar. Associated mass effect with up to 8 mm of right-to-left shift is not significantly changed. Ventricular size and morphology  is also relatively similar without hydrocephalus or trapping. Basilar cisterns remain patent. Probable extra-axial extension of hemorrhage with trace blood seen overlying the right frontal convexity (series 4, image 25). This was also present on prior exam in retrospect. This measures no more than 2 mm in maximal thickness without significant mass effect. No other  new infarct or hemorrhage. Remainder the brain is otherwise stable and negative. Vascular: No hyperdense vessel. Skull: Scalp soft tissues demonstrate no acute finding. Calvarium intact. Sinuses/Orbits: Globes orbital soft tissues within normal limits. Scattered mucosal thickening noted within the ethmoidal air cells. Small right mastoid effusion noted. Other: None. IMPRESSION: 1. Continued interval evolution of hemorrhagic right MCA distribution infarct, similar in size but slightly more well-defined and hypodense as compared to previous exam. Associated mass effect with up to 8 mm of right-to-left shift is not significantly changed. 2. Probable extra-axial extension of hemorrhage with trace blood overlying the right frontal convexity, similar to previous. No significant mass effect. 3. No other new acute intracranial abnormality. Electronically Signed   By: Rise Mu M.D.   On: 08/08/2020 03:50   CT HEAD WO CONTRAST  Result Date: 07/26/2020 CLINICAL DATA:  Stroke follow-up. EXAM: CT HEAD WITHOUT CONTRAST TECHNIQUE: Contiguous axial images were obtained from the base of the skull through the vertex without intravenous contrast. COMPARISON:  Head CT 07/23/2020 at 9:23 a.m. FINDINGS: Brain: A large acute right inferior division MCA infarct is again seen. Cytotoxic edema has mildly progressed with increased leftward midline shift, now measuring 8 mm (previously 4 mm). Confluent petechial type hemorrhage along the infarct and likely interspersed subarachnoid hemorrhage are unchanged. There has been no significant interval infarct extension. No  new infarct is identified. The ventricles are unchanged in size. Vascular: No hyperdense vessel. Skull: No fracture or suspicious osseous lesion. Sinuses/Orbits: Small right mastoid effusion. Clear paranasal sinuses. Unremarkable orbits. Other: None. IMPRESSION: Evolving hemorrhagic right MCA infarct with increased leftward midline shift, now 8 mm. Electronically Signed   By: Sebastian Ache M.D.   On: 08/05/2020 20:30   DG Abd Portable 1V  Result Date: 08/08/2020 CLINICAL DATA:  40 year old female status post feeding tube placement. EXAM: PORTABLE ABDOMEN - 1 VIEW COMPARISON:  Earlier radiograph dated 08/08/2020 FINDINGS: Feeding tube with tip likely in the region of the duodenal bulb. No other interval change. IMPRESSION: Feeding tube with tip likely in the region of the duodenal bulb. Electronically Signed   By: Elgie Collard M.D.   On: 08/08/2020 16:30   DG Abd Portable 1V  Result Date: 08/08/2020 CLINICAL DATA:  Feeding tube placement EXAM: PORTABLE ABDOMEN - 1 VIEW COMPARISON:  CT 08/10/2020 FINDINGS: Tip of a transesophageal feeding tube terminates to the right of midline, near the gastric antrum/duodenal bulb. Lung bases are clear. Bowel gas pattern is unremarkable. No acute or worrisome osseous or other soft tissue abnormalities. Telemetry leads overlie the abdomen and chest. IMPRESSION: Feeding tube tip terminates near the gastric antrum/duodenal bulb. Electronically Signed   By: Kreg Shropshire M.D.   On: 08/08/2020 15:32   EEG adult  Result Date: 08/08/2020 Charlsie Quest, MD     08/08/2020  1:23 PM Patient Name: Laniya Friedl MRN: [id number] Epilepsy Attending: Charlsie Quest Referring Physician/Provider: Thelma Comp Sharol Harness Date: 08/08/2020 Duration: 23.13 mins Patient history: 40 year old female with right MCA infarct with hemorrhagic transformation with worsening mental status.  EEG to evaluate for seizures. Level of alertness: Awake, asleep AEDs during EEG study: Keppra  Technical aspects: This EEG study was done with scalp electrodes positioned according to the 10-20 International system of electrode placement. Electrical activity was acquired at a sampling rate of  and reviewed with a high frequency filter of  and a low frequency filter of . EEG data were recorded continuously and digitally stored. Description: The posterior dominant rhythm consists of 7.5-8  Hz activity of moderate voltage (25-35 uV) seen predominantly in posterior head regions, asymmetric ( right<left) and reactive to eye opening and eye closing. Sleep was characterized by vertex waves, sleep spindles (12 to 14 Hz), maximal frontocentral region. EEG showed continuous 3 to 5 Hz theta-delta slowing in right hemisphere, maximal right frontal region. Intermittent 3 to 5 Hz theta-delta slowing was also noted in left hemisphere.  Sharp waves were noted in right frontal region.  Hyperventilation and photic stimulation were not performed.   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  Additionally there is evidence of cortical dysfunction in right hemisphere, maximal right frontal region likely secondary to underlying stroke. There is also mild diffuse encephalopathy, nonspecific etiology. No seizures were seen throughout the recording. Priyanka O Yadav   VAS [place] TRANSCRANIAL DOPPLER W BUBBLES  Result Date: 08/08/2020  Transcranial Doppler with Bubble Patient Name:  JAMILYN PIGEON TEXTA  Date of Exam:   08/08/2020 Medical Rec #: [id number]            Accession #:    [id number] Date of Birth: 11-15-1980           Patient Gender: F Patient Age:   39Y Exam Location:  [facility] Procedure:      VAS [place] TRANSCRANIAL Demetrios Isaacs Referring Phys: [id number] PRAMOD S SETHI  --------------------------------------------------------------------------------  Indications: Subarachnoid hemorrhage. Limitations: Patient unable to cooperate Comparison Study: No prior study Performing Technologist: Gertie Fey MHA, RDMS, RVT, RDCS  Examination Guidelines: A complete evaluation includes B-mode imaging, spectral Doppler, color Doppler, and power Doppler as needed of all accessible portions of each vessel. Bilateral testing is considered an integral part of a complete examination. Limited examinations for reoccurring indications may be performed as noted.  Summary: No HITS at rest or during manual Valsalva. Negative transcranial Doppler Bubble study with no evidence of right to left intracardiac communication.  A vascular evaluation was performed. The right middle cerebral artery was studied. An IV was inserted into the patient's right forearm. Verbal informed consent was obtained.  *See table(s) above for TCD measurements and observations.    Preliminary    ECHOCARDIOGRAM COMPLETE BUBBLE STUDY  Result Date: 07/15/2020    ECHOCARDIOGRAM REPORT   Patient Name:   KAETLYN NOA TEXTA Date of Exam: 07/28/2020 Medical Rec #:  [id number]           Height:       66.0 in Accession #:    [id number]          Weight:       164.9 lb Date of Birth:  Mar 21, 1980          BSA:          1.842 m Patient Age:    39 years            BP:           131/74 mmHg Patient Gender: F                   HR:           73 bpm. Exam Location:  Inpatient Procedure: 2D Echo, Cardiac Doppler and Color Doppler Indications:    Stroke  History:        Patient has no prior history of Echocardiogram examinations.  Signs/Symptoms:Altered Mental Status.  Sonographer:    Roosvelt Maser RDCS Referring Phys: [id number] [facility] IMPRESSIONS  1. Left ventricular ejection fraction, by estimation, is 60 to 65%. The left ventricle has normal function. The left ventricle has no regional wall motion abnormalities. Left  ventricular diastolic parameters were normal.  2. Right ventricular systolic function is normal. The right ventricular size is normal. Tricuspid regurgitation signal is inadequate for assessing PA pressure.  3. The mitral valve is grossly normal. No evidence of mitral valve regurgitation. No evidence of mitral stenosis.  4. The aortic valve is tricuspid. Aortic valve regurgitation is not visualized. No aortic stenosis is present.  5. The inferior vena cava is normal in size with greater than 50% respiratory variability, suggesting right atrial pressure of 3 mmHg.  6. Agitated saline contrast bubble study was negative, with no evidence of any interatrial shunt. Conclusion(s)/Recommendation(s): No intracardiac source of embolism detected on this transthoracic study. A transesophageal echocardiogram is recommended to exclude cardiac source of embolism if clinically indicated. FINDINGS  Left Ventricle: Left ventricular ejection fraction, by estimation, is 60 to 65%. The left ventricle has normal function. The left ventricle has no regional wall motion abnormalities. The left ventricular internal cavity size was normal in size. There is  no left ventricular hypertrophy. Left ventricular diastolic parameters were normal. Right Ventricle: The right ventricular size is normal. No increase in right ventricular wall thickness. Right ventricular systolic function is normal. Tricuspid regurgitation signal is inadequate for assessing PA pressure. Left Atrium: Left atrial size was normal in size. Right Atrium: Right atrial size was normal in size. Pericardium: Trivial pericardial effusion is present. Mitral Valve: The mitral valve is grossly normal. No evidence of mitral valve regurgitation. No evidence of mitral valve stenosis. Tricuspid Valve: The tricuspid valve is grossly normal. Tricuspid valve regurgitation is not demonstrated. No evidence of tricuspid stenosis. Aortic Valve: The aortic valve is tricuspid. Aortic valve  regurgitation is not visualized. No aortic stenosis is present. Aortic valve mean gradient measures 4.0 mmHg. Aortic valve peak gradient measures 6.9 mmHg. Aortic valve area, by VTI measures 2.74 cm. Pulmonic Valve: The pulmonic valve was grossly normal. Pulmonic valve regurgitation is not visualized. No evidence of pulmonic stenosis. Aorta: The aortic root and ascending aorta are structurally normal, with no evidence of dilitation. Venous: The right upper pulmonary vein is normal. The inferior vena cava is normal in size with greater than 50% respiratory variability, suggesting right atrial pressure of 3 mmHg. IAS/Shunts: The atrial septum is grossly normal. Agitated saline contrast was given intravenously to evaluate for intracardiac shunting. Agitated saline contrast bubble study was negative, with no evidence of any interatrial shunt.  LEFT VENTRICLE PLAX 2D LVIDd:         5.30 cm  Diastology LVIDs:         3.30 cm  LV e' medial:    10.40 cm/s LV PW:         0.50 cm  LV E/e' medial:  7.2 LV IVS:        0.50 cm  LV e' lateral:   11.50 cm/s LVOT diam:     1.90 cm  LV E/e' lateral: 6.5 LV SV:         73 LV SV Index:   40 LVOT Area:     2.84 cm  RIGHT VENTRICLE             IVC RV S prime:     18.50 cm/s  IVC diam: 1.40 cm TAPSE (  M-mode): 1.9 cm LEFT ATRIUM             Index       RIGHT ATRIUM          Index LA diam:        3.70 cm 2.01 cm/m  RA Area:     7.98 cm LA Vol (A2C):   37.5 ml 20.35 ml/m RA Volume:   13.30 ml 7.22 ml/m LA Vol (A4C):   23.9 ml 12.97 ml/m LA Biplane Vol: 30.0 ml 16.28 ml/m  AORTIC VALVE AV Area (Vmax):    2.58 cm AV Area (Vmean):   2.40 cm AV Area (VTI):     2.74 cm AV Vmax:           131.00 cm/s AV Vmean:          89.700 cm/s AV VTI:            0.268 m AV Peak Grad:      6.9 mmHg AV Mean Grad:      4.0 mmHg LVOT Vmax:         119.00 cm/s LVOT Vmean:        75.900 cm/s LVOT VTI:          0.259 m LVOT/AV VTI ratio: 0.97  AORTA Ao Root diam: 2.80 cm Ao Asc diam:  2.60 cm MITRAL VALVE  MV Area (PHT): 4.68 cm    SHUNTS MV Decel Time: 162 msec    Systemic VTI:  0.26 m MV E velocity: 75.00 cm/s  Systemic Diam: 1.90 cm MV A velocity: 45.00 cm/s MV E/A ratio:  1.67 Lennie Odor MD Electronically signed by Lennie Odor MD Signature Date/Time: 08/10/2020/3:24:30 PM    Final    VAS US CAROTID  Result Date: 08/08/2020 Carotid Arterial Duplex Study Patient Name:  DARNESHA DILORETO TEXTA  Date of Exam:   07/29/2020 Medical Rec #: [id number]            Accession #:    [id number] Date of Birth: 03-07-1981           Patient Gender: F Patient Age:   76Y Exam Location:  [facility] Procedure:      VAS US CAROTID Referring Phys: [id number] [facility] --------------------------------------------------------------------------------  Indications:       CVA. Other Factors:     History of gestational diabetes, no other significant medical                    history noted. Comparison Study:  No prior studies. Performing Technologist: Jean Rosenthal RDMS,RVT  Examination Guidelines: A complete evaluation includes B-mode imaging, spectral Doppler, color Doppler, and power Doppler as needed of all accessible portions of each vessel. Bilateral testing is considered an integral part of a complete examination. Limited examinations for reoccurring indications may be performed as noted.  Right Carotid Findings: +----------+--------+--------+--------+------------------+------------------+           PSV cm/sEDV cm/sStenosisPlaque DescriptionComments           +----------+--------+--------+--------+------------------+------------------+ CCA Prox  129     17                                intimal thickening +----------+--------+--------+--------+------------------+------------------+ CCA Distal115     25                                                   +----------+--------+--------+--------+------------------+------------------+  ICA Prox  95      24                                                    +----------+--------+--------+--------+------------------+------------------+ ICA Distal93      33                                                   +----------+--------+--------+--------+------------------+------------------+ ECA       142     25                                                   +----------+--------+--------+--------+------------------+------------------+ +----------+--------+-------+----------------+-------------------+           PSV cm/sEDV cmsDescribe        Arm Pressure (mmHG) +----------+--------+-------+----------------+-------------------+ Subclavian240            Multiphasic, WNL                    +----------+--------+-------+----------------+-------------------+ +---------+--------+--+--------+--+---------+ VertebralPSV cm/s70EDV cm/s19Antegrade +---------+--------+--+--------+--+---------+  Left Carotid Findings: +----------+--------+--------+--------+------------------+------------------+           PSV cm/sEDV cm/sStenosisPlaque DescriptionComments           +----------+--------+--------+--------+------------------+------------------+ CCA Prox  107     22                                intimal thickening +----------+--------+--------+--------+------------------+------------------+ CCA Distal90      21                                                   +----------+--------+--------+--------+------------------+------------------+ ICA Prox  64      18                                                   +----------+--------+--------+--------+------------------+------------------+ ICA Distal75      29                                                   +----------+--------+--------+--------+------------------+------------------+ ECA       104     20                                                   +----------+--------+--------+--------+------------------+------------------+  +----------+--------+--------+----------------+-------------------+           PSV cm/sEDV cm/sDescribe        Arm Pressure (mmHG) +----------+--------+--------+----------------+-------------------+ [id number]  Multiphasic, WNL                    +----------+--------+--------+----------------+-------------------+ +---------+--------+--+--------+--+---------+ VertebralPSV cm/s68EDV cm/s17Antegrade +---------+--------+--+--------+--+---------+   Summary: Right Carotid: The extracranial vessels were near-normal with only minimal wall                thickening or plaque. Left Carotid: The extracranial vessels were near-normal with only minimal wall               thickening or plaque. Vertebrals:  Bilateral vertebral arteries demonstrate antegrade flow. Subclavians: Normal flow hemodynamics were seen in bilateral subclavian              arteries. *See table(s) above for measurements and observations.  Electronically signed by Delia Heady MD on 08/08/2020 at 1:55:36 PM.    Final     Review of Systems  Unable to perform ROS: Acuity of condition   Blood pressure (!) 148/56, pulse (!) 49, temperature 99.3 F (37.4 C), temperature source Oral, resp. rate (!) 23, height  (1.676 m), weight 71 kg, last menstrual period 08/05/2020, SpO2 96 %.  Physical Exam:  Patient is not alert but responds to noxious stimuli and is restless. She will open her eyes with repeated stimuli but does not make eye contact.  Left eye mid position, right eye outward gaze, no blink to threat bilaterally, right pupil 60mm->3mm, left pupil 3.22mm->2mm. Left nasolabial fold flattening.  LLE with no movement. LUE with movement with gravity eliminated. Spontaneous on the right with movement against gravity only. She in nonverbal and is unable to follow commands.        Assessment/Plan: 40 y.o. 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 Her neuro examination and repeat  head were CT concerning for increasing cerebral edema and MLS as well as early signs of impending uncal herniation on the right. On hypertonic saline at 75 ml/hr. Spanish translator used to call the patient's husband to discuss surgery plan and obtain consent. However, the patient's husband did not answer and his voicemail was not set up. Plan for right-sided decompressive hemicraniectomy with bone flap implanted in the abdomen. Transfer to neuro ICU after surgery.  Council Mechanic, DNP, NP-C 08/08/2020, 11:58 AM

## 2020-08-09 NOTE — Anesthesia Procedure Notes (Signed)
Arterial Line Insertion Start/End2022/06/11 1:15 PM, 23-Aug-2020 1:33 PM Performed by: Jodell Cipro, CRNA, CRNA  Patient location: OR. Preanesthetic checklist: patient identified, IV checked, site marked, monitors and equipment checked, pre-op evaluation and timeout performed Emergency situation Patient sedated Right, radial was placed Catheter size: 20 G Hand hygiene performed , maximum sterile barriers used  and Seldinger technique used Allen's test indicative of satisfactory collateral circulation Attempts: 3 (2 attempts to L radial unable to thread cath) Procedure performed without using ultrasound guided technique. Following insertion, dressing applied and Biopatch. Post procedure assessment: normal  Patient tolerated the procedure well with no immediate complications. Additional procedure comments: Placed under anesthesia .

## 2020-08-10 ENCOUNTER — Inpatient Hospital Stay (HOSPITAL_COMMUNITY): Payer: Self-pay

## 2020-08-10 DIAGNOSIS — D649 Anemia, unspecified: Secondary | ICD-10-CM

## 2020-08-10 DIAGNOSIS — G08 Intracranial and intraspinal phlebitis and thrombophlebitis: Secondary | ICD-10-CM

## 2020-08-10 DIAGNOSIS — R509 Fever, unspecified: Secondary | ICD-10-CM

## 2020-08-10 LAB — CBC
HCT: 23.8 % — ABNORMAL LOW (ref 36.0–46.0)
Hemoglobin: 6.2 g/dL — CL (ref 12.0–15.0)
MCH: 16.4 pg — ABNORMAL LOW (ref 26.0–34.0)
MCHC: 26.1 g/dL — ABNORMAL LOW (ref 30.0–36.0)
MCV: 63.1 fL — ABNORMAL LOW (ref 80.0–100.0)
Platelets: 400 10*3/uL (ref 150–400)
RBC: 3.77 MIL/uL — ABNORMAL LOW (ref 3.87–5.11)
RDW: 19.8 % — ABNORMAL HIGH (ref 11.5–15.5)
WBC: 21.3 10*3/uL — ABNORMAL HIGH (ref 4.0–10.5)
nRBC: 0.1 % (ref 0.0–0.2)

## 2020-08-10 LAB — BASIC METABOLIC PANEL
BUN: 9 mg/dL (ref 6–20)
CO2: 23 mmol/L (ref 22–32)
Calcium: 8 mg/dL — ABNORMAL LOW (ref 8.9–10.3)
Chloride: >130 mmol/L (ref 98–111)
Creatinine, Ser: 0.7 mg/dL (ref 0.44–1.00)
GFR, Estimated: >60 mL/min (ref >60)
Glucose, Bld: 125 mg/dL — ABNORMAL HIGH (ref 70–99)
Potassium: 2.6 mmol/L — CL (ref 3.5–5.1)
Sodium: 160 mmol/L — ABNORMAL HIGH (ref 135–145)

## 2020-08-10 LAB — GLUCOSE, CAPILLARY
Glucose-Capillary: 140 mg/dL — ABNORMAL HIGH (ref 70–99)
Glucose-Capillary: 144 mg/dL — ABNORMAL HIGH (ref 70–99)
Glucose-Capillary: 147 mg/dL — ABNORMAL HIGH (ref 70–99)
Glucose-Capillary: 171 mg/dL — ABNORMAL HIGH (ref 70–99)
Glucose-Capillary: 175 mg/dL — ABNORMAL HIGH (ref 70–99)
Glucose-Capillary: 148 mg/dL — ABNORMAL HIGH (ref 70–99)

## 2020-08-10 LAB — HEMOGLOBIN AND HEMATOCRIT, BLOOD
HCT: 28.7 % — ABNORMAL LOW (ref 36.0–46.0)
Hemoglobin: 8 g/dL — ABNORMAL LOW (ref 12.0–15.0)

## 2020-08-10 LAB — SODIUM
Sodium: 161 mmol/L (ref 135–145)
Sodium: 158 mmol/L — ABNORMAL HIGH (ref 135–145)
Sodium: 157 mmol/L — ABNORMAL HIGH (ref 135–145)

## 2020-08-10 LAB — ANTIPHOSPHOLIPID SYNDROME EVAL, BLD
Anticardiolipin IgA: <9 APL U/mL (ref 0–11)
Anticardiolipin IgG: <9 GPL U/mL (ref 0–14)
Anticardiolipin IgM: 10 MPL U/mL (ref 0–12)
DRVVT: 41.9 s (ref 0.0–47.0)
PTT Lupus Anticoagulant: 29 s (ref 0.0–51.9)
Phosphatydalserine, IgA: 1 APS Units (ref 0–19)
Phosphatydalserine, IgG: 9 Units (ref 0–30)
Phosphatydalserine, IgM: 10 Units (ref 0–30)

## 2020-08-10 LAB — PREPARE RBC (CROSSMATCH)

## 2020-08-10 LAB — MAGNESIUM: Magnesium: 1.8 mg/dL (ref 1.7–2.4)

## 2020-08-10 IMAGING — DX DG CHEST 1V PORT
1 series · 1 of 1 positions shown · non-contrast
Comparison: [DATE]

CLINICAL DATA: Intracranial hemorrhage.

EXAM:
PORTABLE CHEST 1 VIEW

[chest ap]
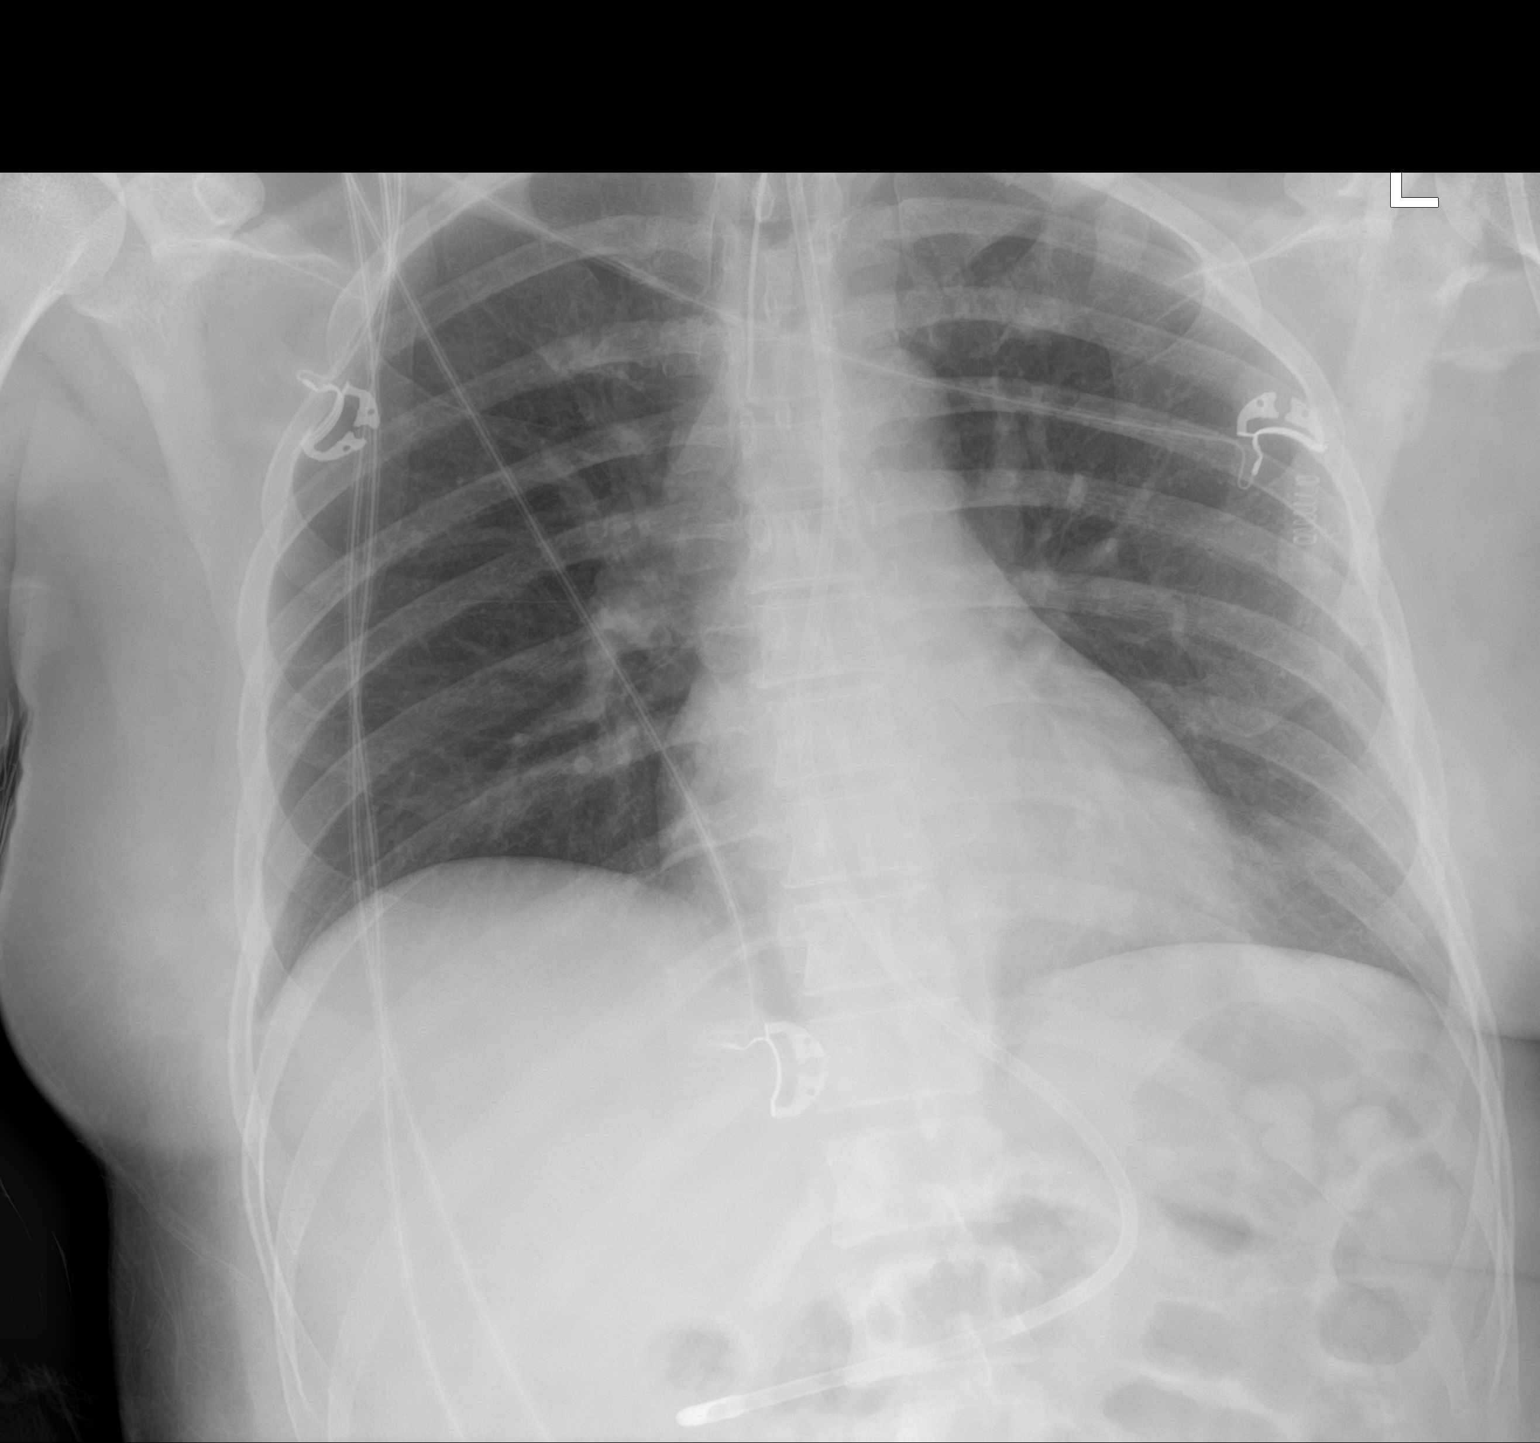

[1 of 1 positions shown; findings below may reference images not displayed]

FINDINGS: Endotracheal tube in satisfactory position. Enteric catheter with
tip overlying the expected location of the gastric antrum.

Cardiomediastinal silhouette is normal. Mediastinal contours appear
intact.

There is no evidence of focal airspace consolidation, pleural
effusion or pneumothorax.

Osseous structures are without acute abnormality. Soft tissues are
grossly normal.
IMPRESSION: No active disease.

## 2020-08-10 IMAGING — CT CT ANGIO HEAD-NECK (W OR W/O PERF)
1 of 8 series · 14 of 47 positions shown · IV contrast (omnipaque)
Comparison: MRA from 3 days ago

CLINICAL DATA: Worsening brain edema

EXAM:
CT ANGIOGRAPHY HEAD AND NECK
TECHNIQUE: Multidetector CT imaging of the head and neck was performed using
the standard protocol during bolus administration of intravenous
contrast. Multiplanar CT image reconstructions and MIPs were
obtained to evaluate the vascular anatomy. Carotid stenosis
measurements (when applicable) are obtained utilizing NASCET
criteria, using the distal internal carotid diameter as the
denominator.
CONTRAST:  75mL OMNIPAQUE IOHEXOL 350 MG/ML SOLN

[Series 12: thin · axial · 0.51mm/px · z∈[-304,-8]mm · 14 of 686 slices shown]
[im 46/686  brain]
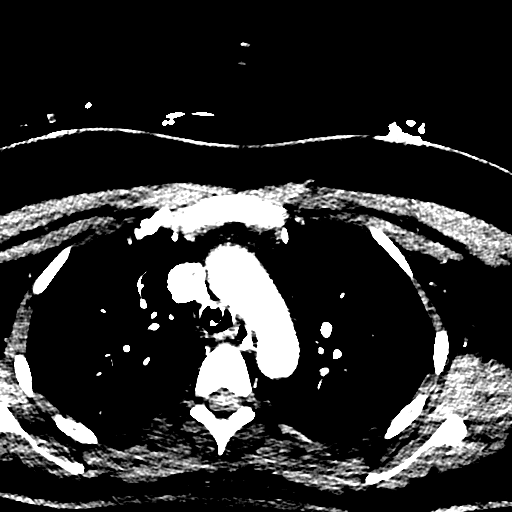
[im 92/686  bone]
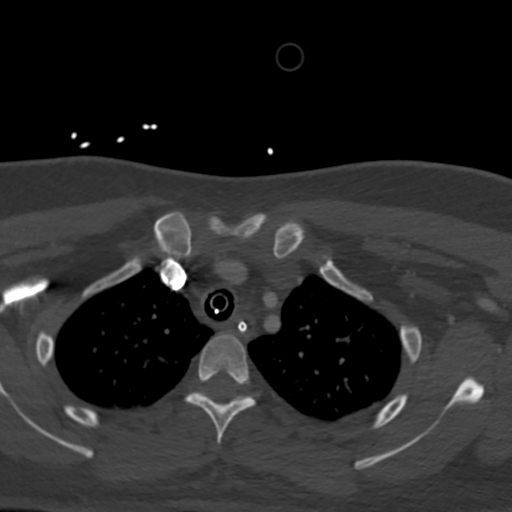
[im 138/686  brain]
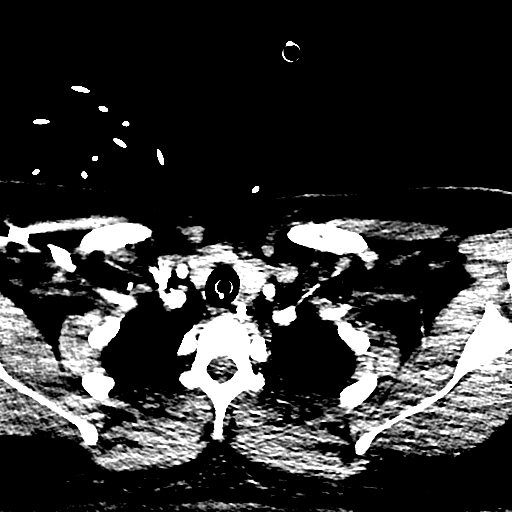
[im 183/686  bone]
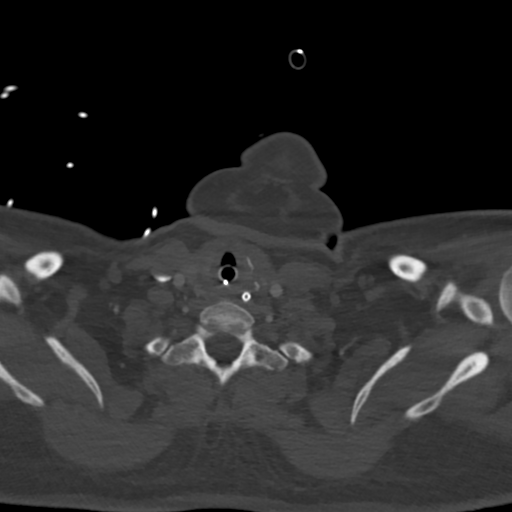
[im 229/686  brain]
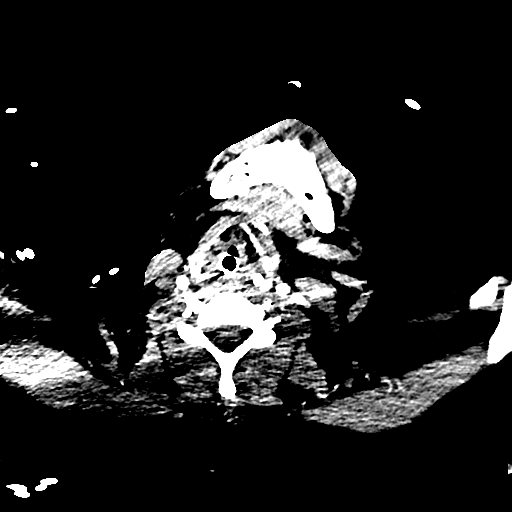
[im 275/686  bone]
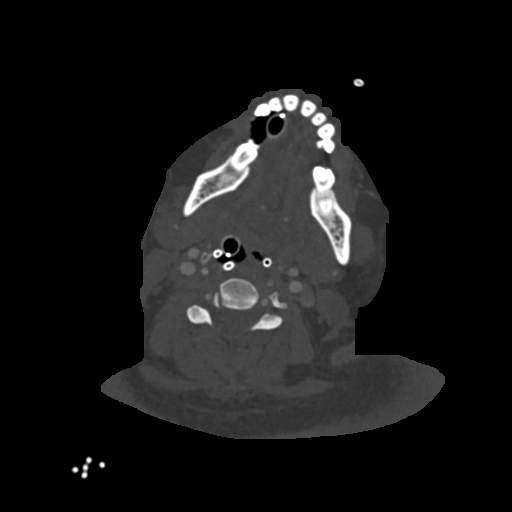
[im 320/686  brain]
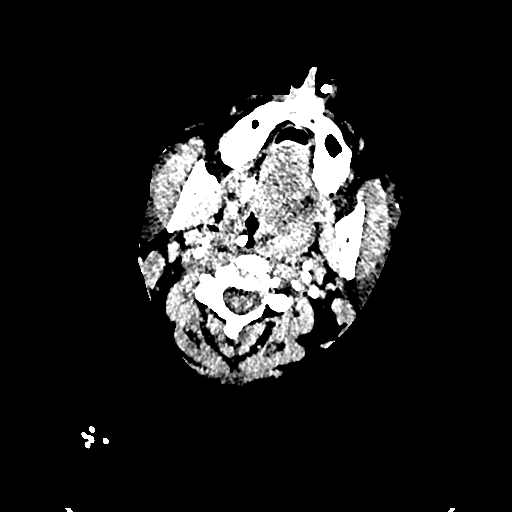
[im 366/686  bone]
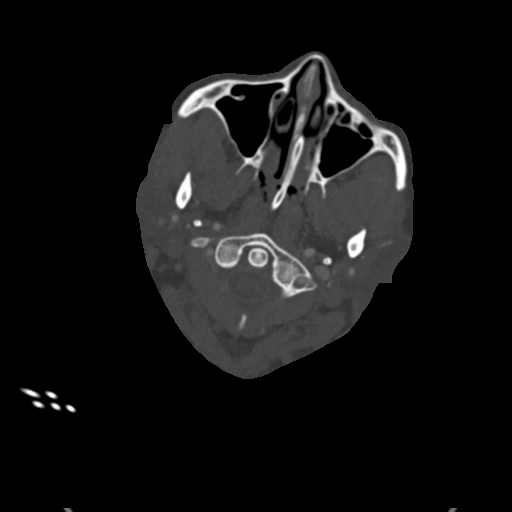
[im 412/686  brain]
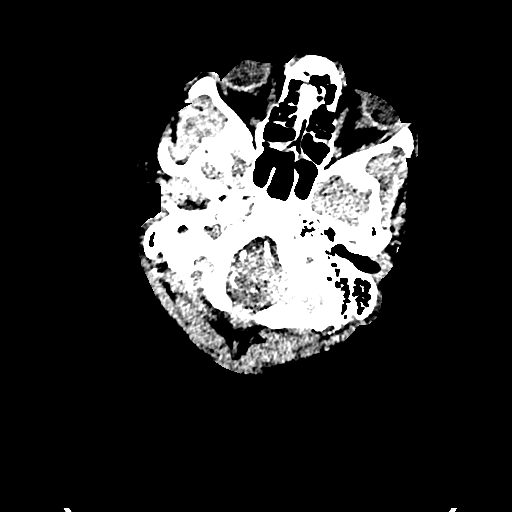
[im 457/686  bone]
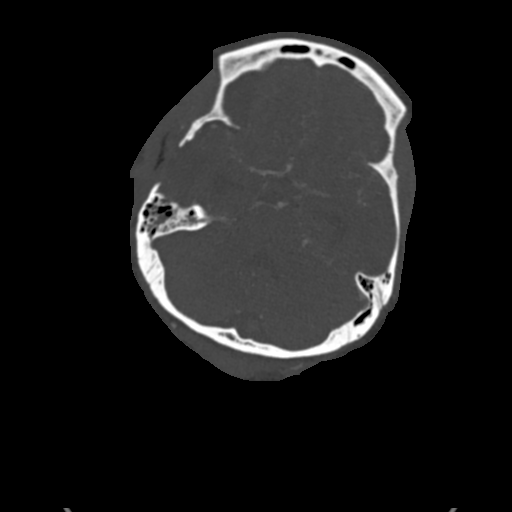
[im 503/686  brain]
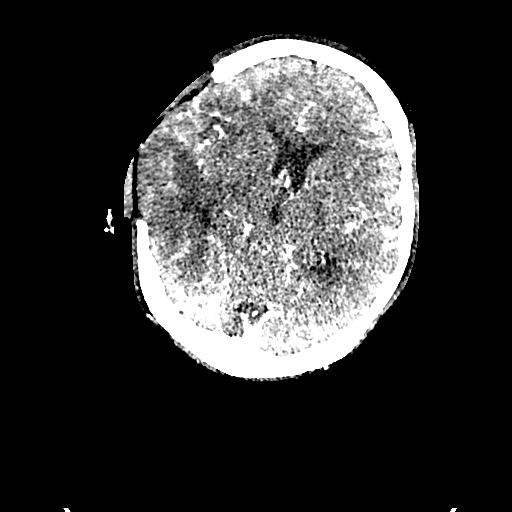
[im 549/686  bone]
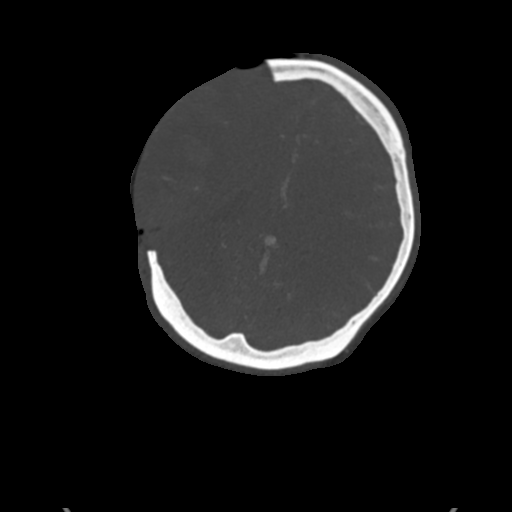
[im 594/686  brain]
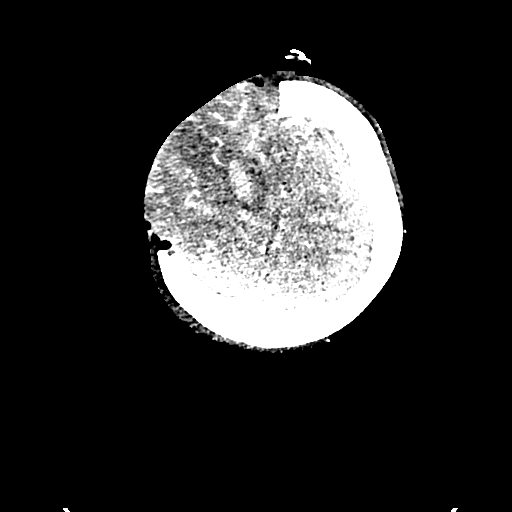
[im 640/686  bone]
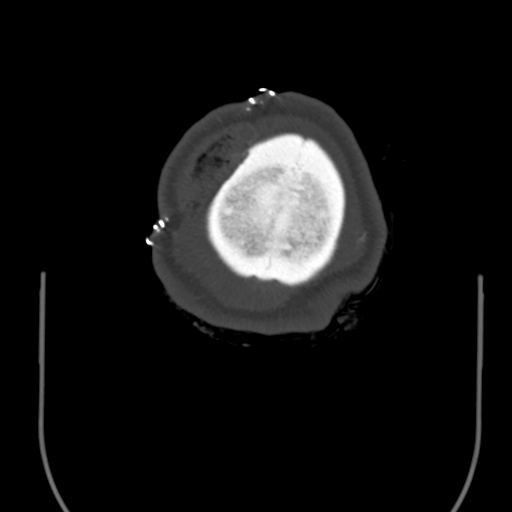

[14 of 47 positions shown; findings below may reference images not displayed]

FINDINGS: CTA NECK FINDINGS

Aortic arch: Negative

Right carotid system: Vessels are smooth and widely patent

Left carotid system: Vessels are smooth and widely patent

Vertebral arteries: No proximal subclavian stenosis. Codominant
vertebral arteries are smooth and widely patent to the dura.

Skeleton: Craniectomy on the right.

Other neck: Negative

Upper chest: Negative

Review of the MIP images confirms the above findings

CTA HEAD FINDINGS

Anterior circulation: Smoothly contoured and widely patent carotid
siphons. Attenuated right MCA branches similar to prior MRA with
very stenotic at the M1 segment. No cortical artery enhancement at
the inferior division where there was cytotoxic edema seen on
initial imaging. The degree of narrowing is more than expected from
pure edema and there has been no visible subarachnoid hemorrhage at
this location to imply vasospasm. There is dural sinus thrombosis on
contemporaneous CT, but a primary arterial insult is also of
concern. The contralateral branch vessels are enhancing as expected.
Negative for aneurysm.

Posterior circulation: The vertebral and basilar arteries are smooth
and widely patent. No branch occlusion, beading, or aneurysm.

Venous sinuses: Reported separately

Anatomic variants: None detected.

Review of the MIP images confirms the above findings
IMPRESSION: 1. Very attenuated right MCA branches similar to prior MRA with
high-grade right M1 segment narrowing and no enhancing vessels at
the level of initially visualized infarct. Although there is dural
sinus thrombosis, there may also be underlying arterial pathology.
2. No stenosis or embolic source seen in the neck.

## 2020-08-10 IMAGING — CT CT VENOGRAM HEAD
3 of 7 series · 10 of 36 positions shown · IV contrast (OMNI)
Comparison: Head CT from earlier today.

CLINICAL DATA: Dural venous sinus thrombosis suspected.

EXAM:
CT VENOGRAM HEAD
TECHNIQUE: Venous phase CT of the head was performed after bolus administration
of iodinated contrast.
CONTRAST:  75mL OMNIPAQUE IOHEXOL 350 MG/ML SOLN

[Series 1: head w · axial · 0.39mm/px · z∈[-100,-50]mm · 2 of 76 slices shown]
[im 26/76  soft-tissue]
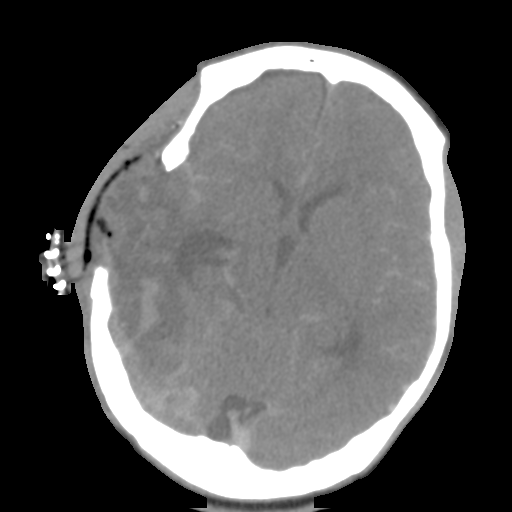
[im 51/76  soft-tissue]
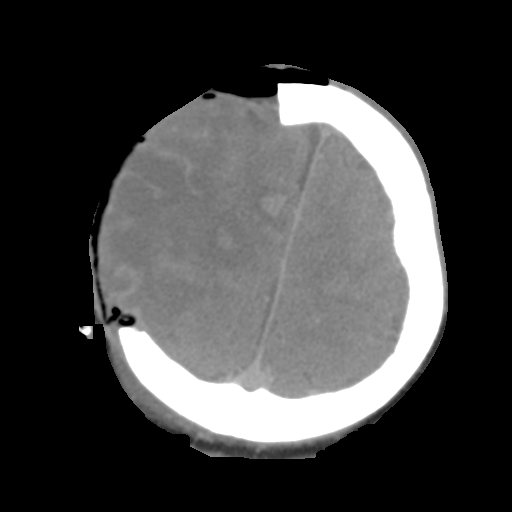

[Series 4: ax ax thin · axial · 0.34mm/px · z∈[-119,-11]mm · 6 of 153 slices shown]
[im 22/153  soft-tissue]
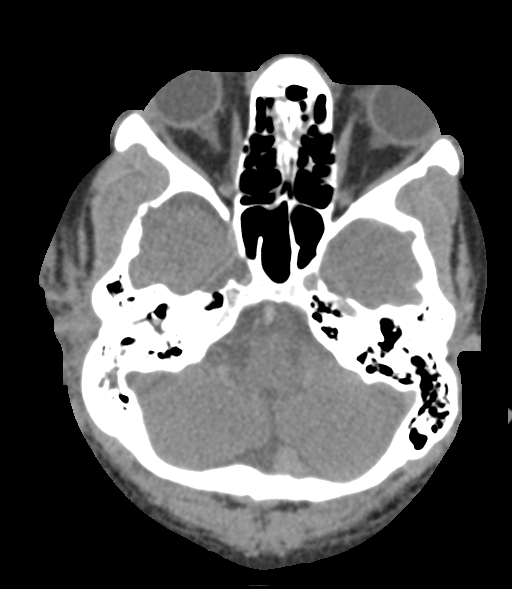
[im 44/153  bone]
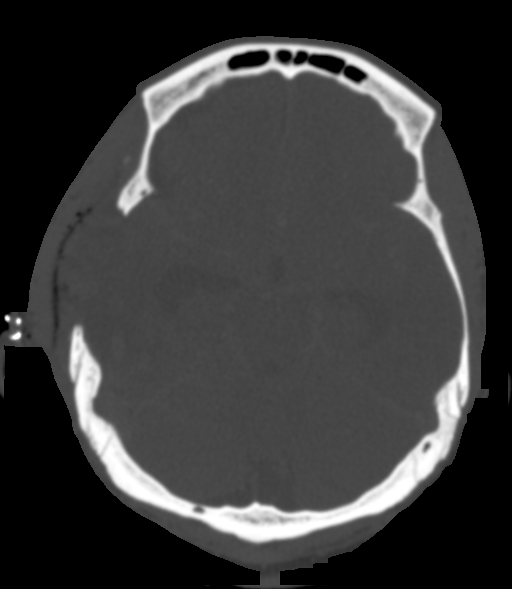
[im 66/153  soft-tissue]
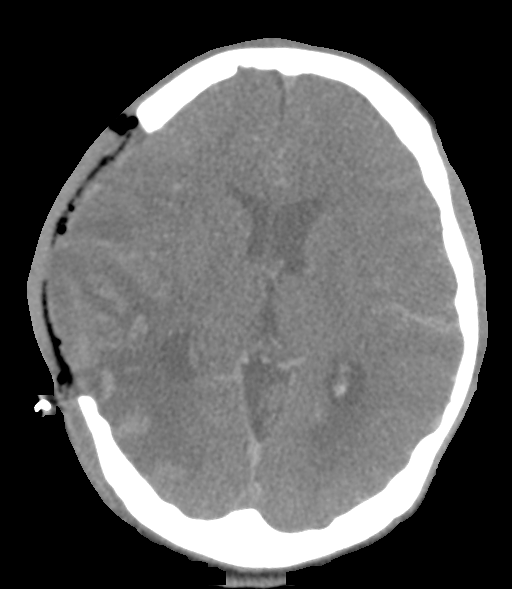
[im 87/153  bone]
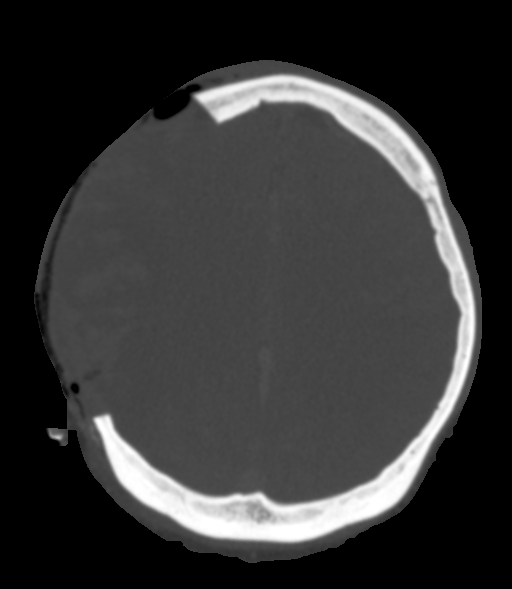
[im 109/153  soft-tissue]
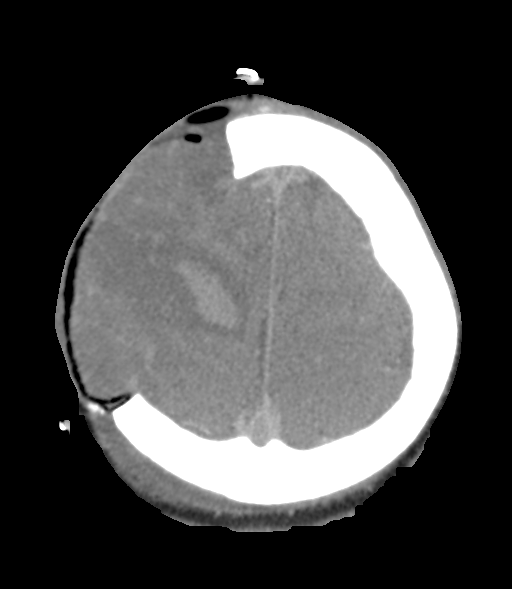
[im 131/153  bone]
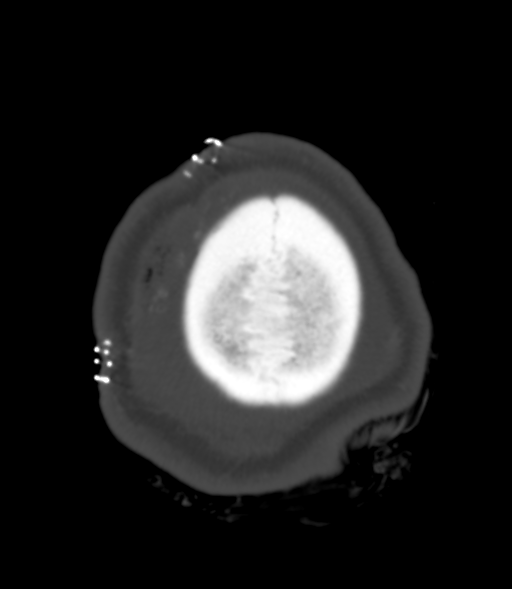

[Series 6: sag thin · sagittal · 0.30mm/px · 2 of 175 slices shown]
[im 6/175  soft-tissue]
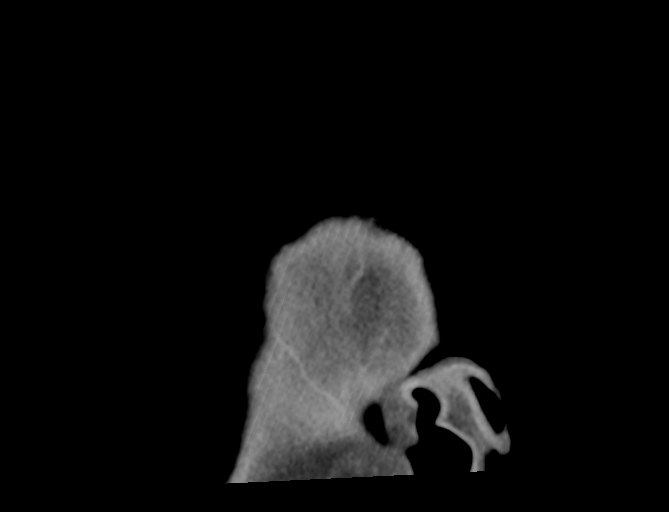
[im 170/175  soft-tissue]
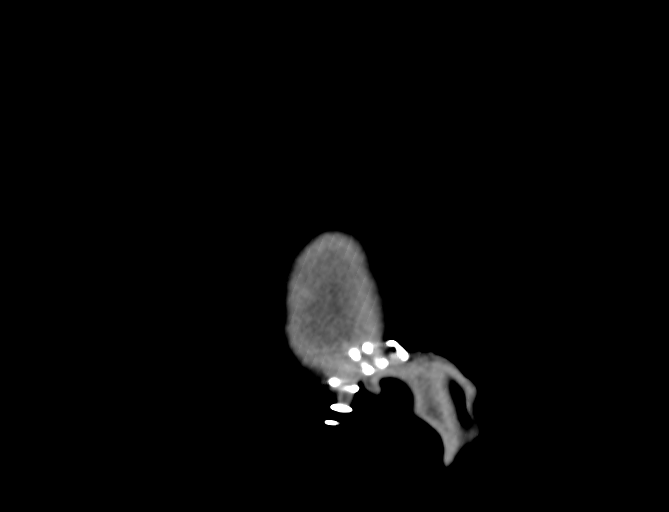

[10 of 36 positions shown; findings below may reference images not displayed]

FINDINGS: No enhancement of the superior sagittal sinus along most of its
length with no enhancement seen in the right transverse, sigmoid,
and upper IJ on the right-the dominant side. Cortical veins are
largely enhancing, as are the paired deep veins. These results were
called by telephone at the time of interpretation on [DATE] at
[DATE] to provider ALES , who verbally acknowledged
these results.
IMPRESSION: Positive for dural venous sinus thrombosis affecting the superior
sagittal sinus and dominant right-sided transverse-sigmoid drainage.

## 2020-08-10 IMAGING — DX DG ABD PORTABLE 1V
1 series · 1 of 1 positions shown · non-contrast
Comparison: Radiograph dated [DATE]

CLINICAL DATA: 39-year-old female status post feeding tube
placement.

EXAM:
PORTABLE ABDOMEN - 1 VIEW

[abdomen supine]
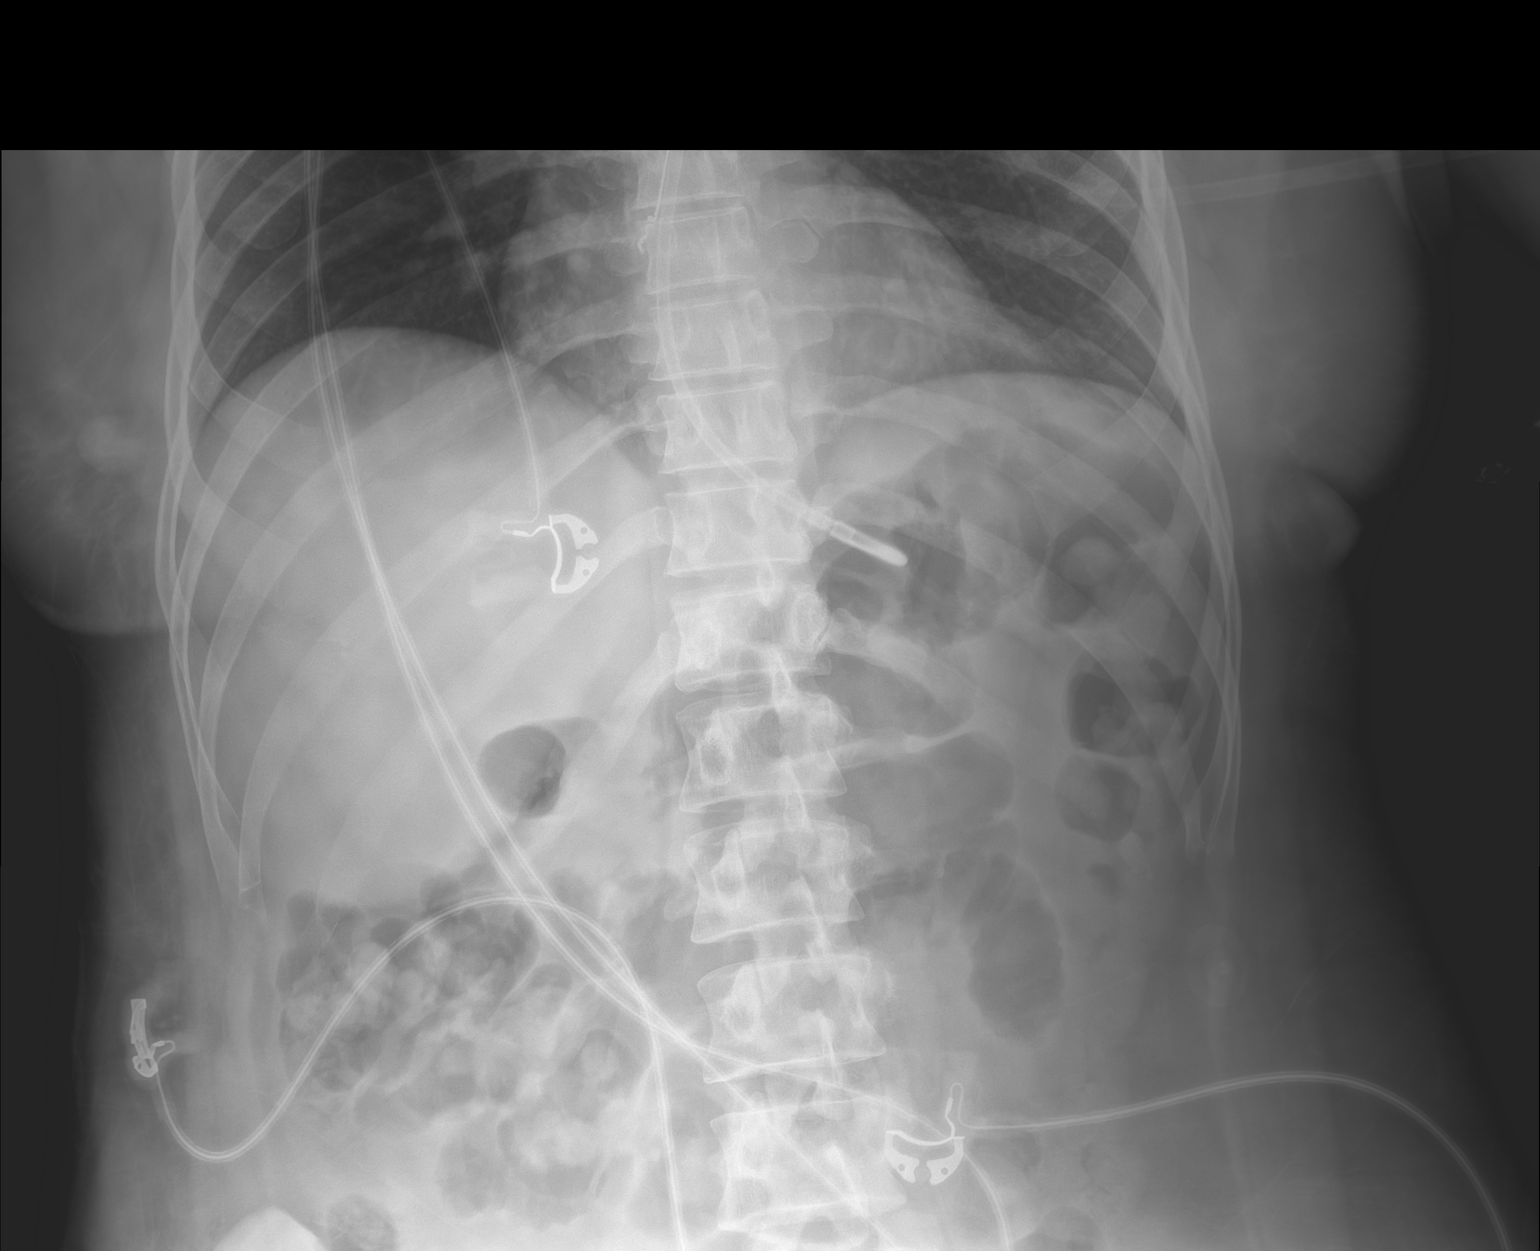

[1 of 1 positions shown; findings below may reference images not displayed]

FINDINGS: Feeding tube with tip just distal to the GE junction. Recommend
further advancing. No bowel dilatation. The osseous structures and
soft tissues are unremarkable.
IMPRESSION: Feeding tube with tip just distal to the GE junction. Recommend
further advancing.

## 2020-08-10 IMAGING — CT CT HEAD W/O CM
4 series · 17 of 47 positions shown, 19 images · non-contrast
Comparison: Yesterday

CLINICAL DATA: Stroke follow-up

EXAM:
CT HEAD WITHOUT CONTRAST
TECHNIQUE: Contiguous axial images were obtained from the base of the skull
through the vertex without intravenous contrast.

[Series 2: head without · axial · non-contrast · 0.42mm/px · z∈[-310,-165]mm · 7 of 39 slices shown, 9 images]
[im 5/39  brain]
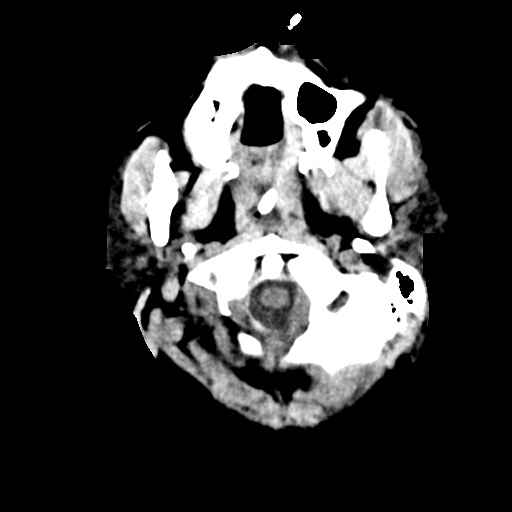
[im 5/39  bone]
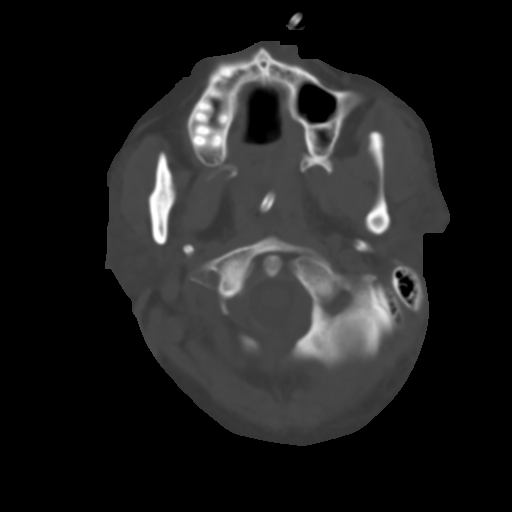
[im 10/39  brain]
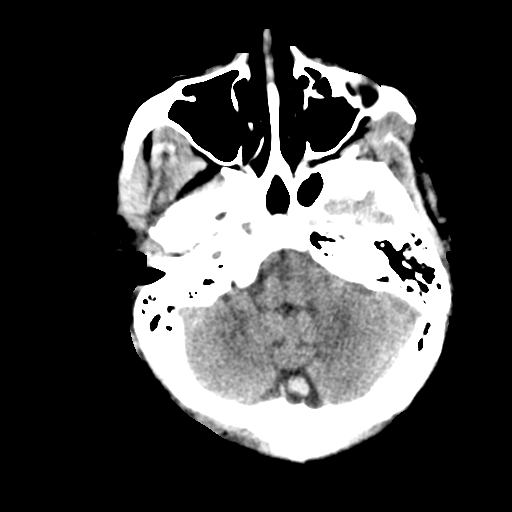
[im 15/39  brain]
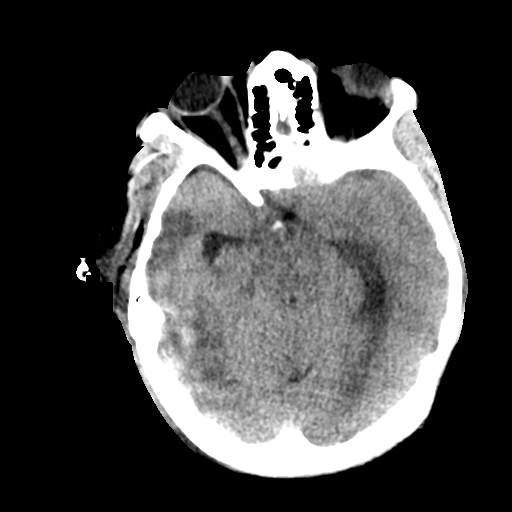
[im 20/39  brain]
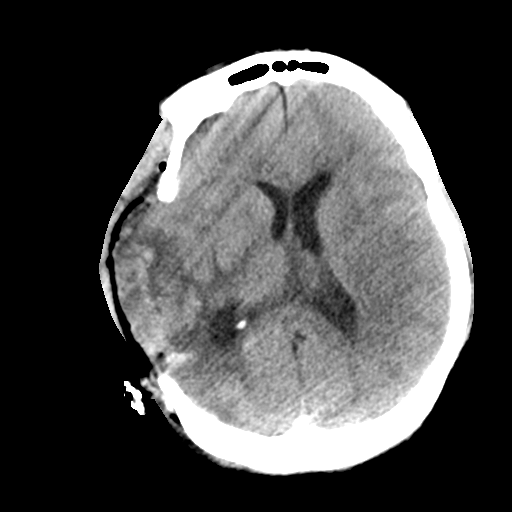
[im 24/39  brain]
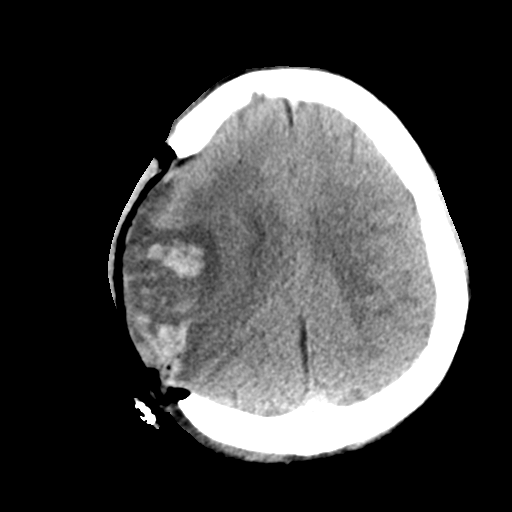
[im 24/39  bone]
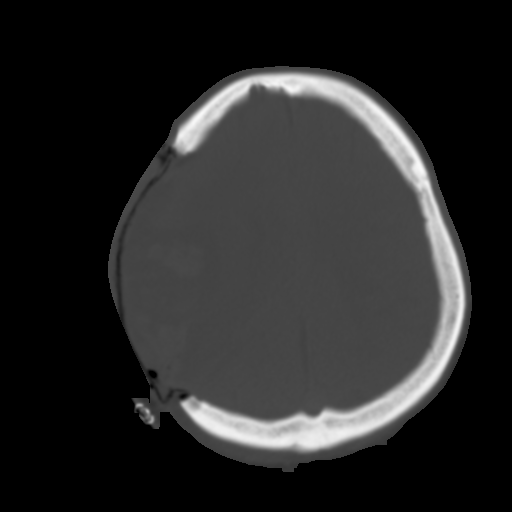
[im 29/39  brain]
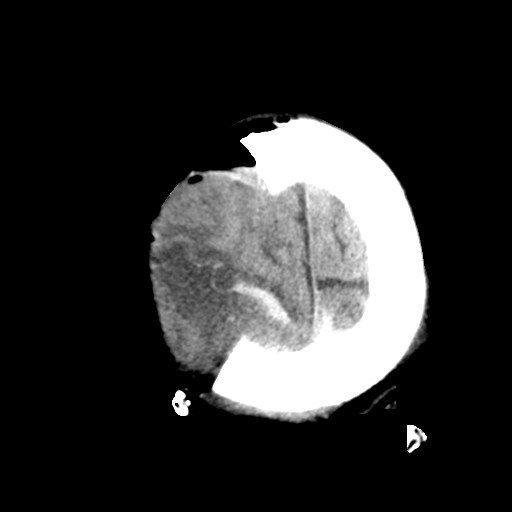
[im 34/39  brain]
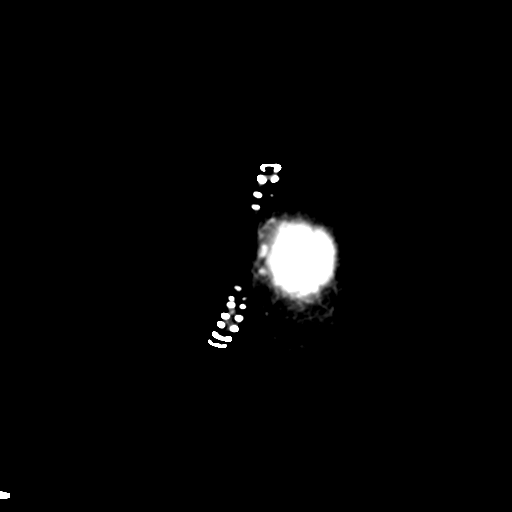

[Series 3: head bone · axial · 0.42mm/px · z∈[-312,-246]mm · 4 of 96 slices shown]
[im 10/96  bone]
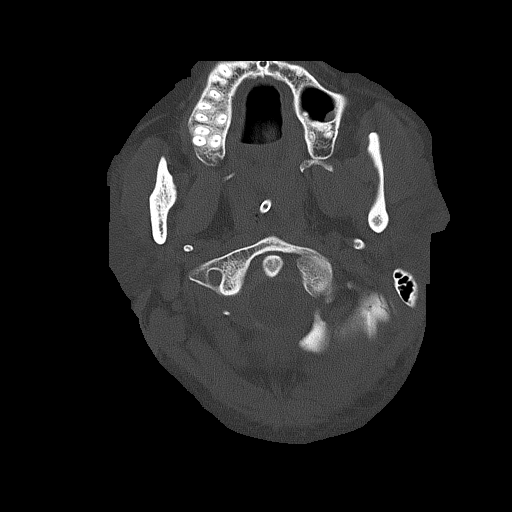
[im 20/96  bone]
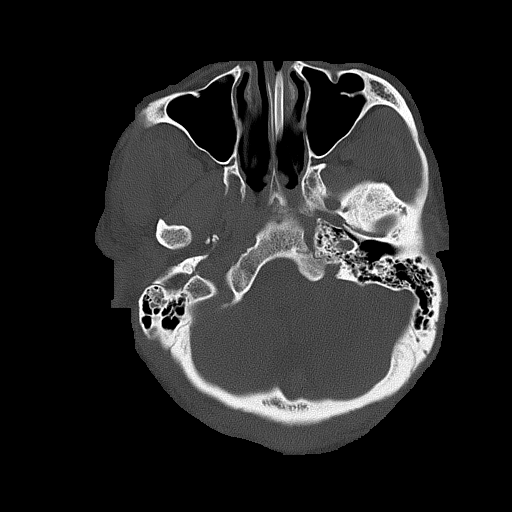
[im 29/96  bone]
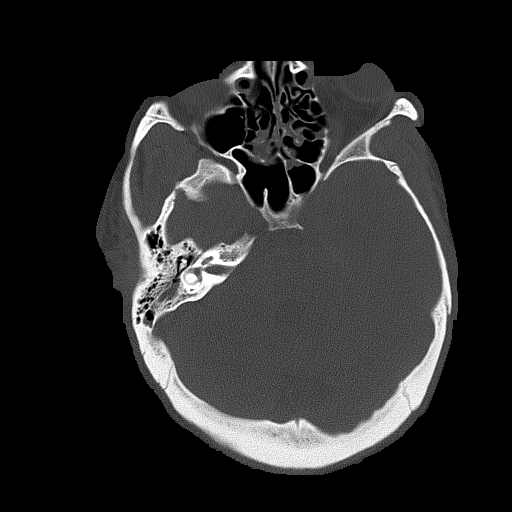
[im 43/96  bone]
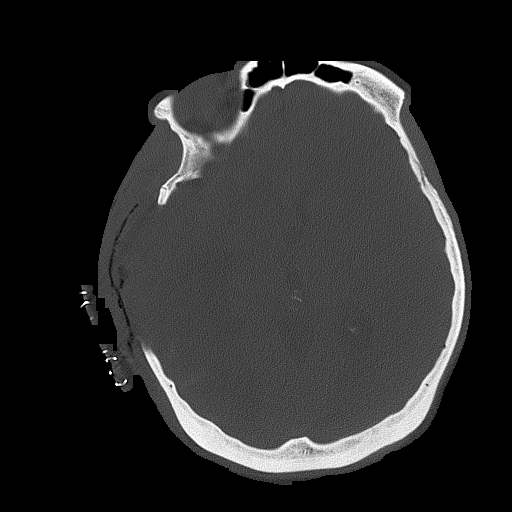

[Series 4: head without cor · coronal · non-contrast · 0.33mm/px · 3 of 67 slices shown]
[im 23/67  brain]
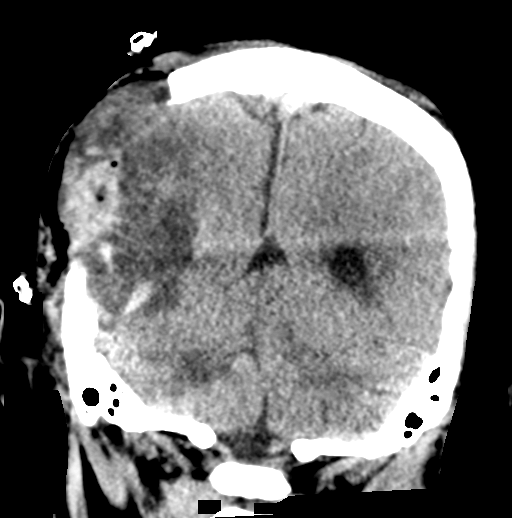
[im 30/67  brain]
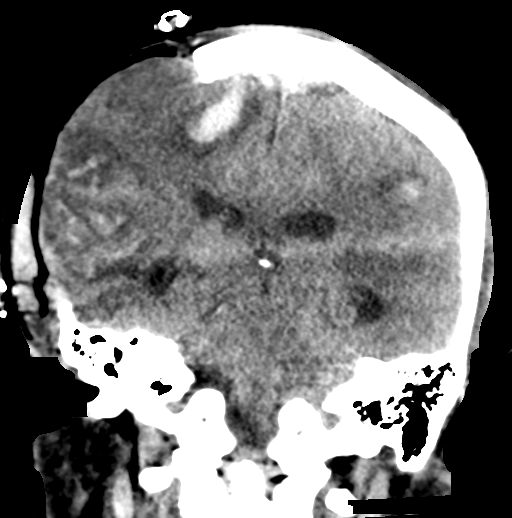
[im 37/67  brain]
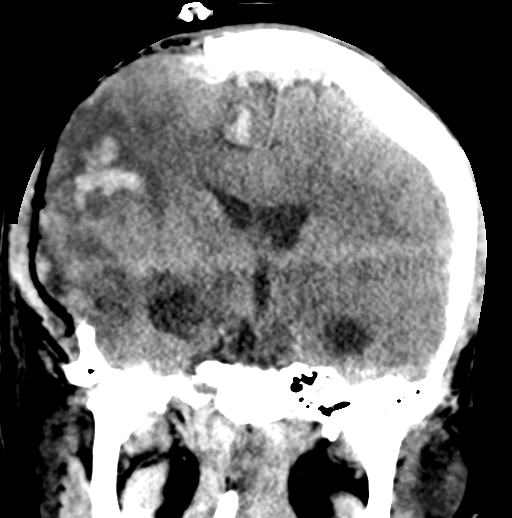

[Series 5: head without sag · sagittal · non-contrast · 0.37mm/px · 3 of 59 slices shown]
[im 20/59  brain]
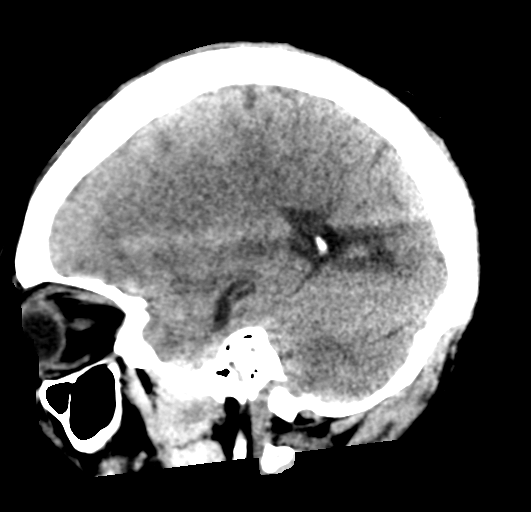
[im 30/59  brain]
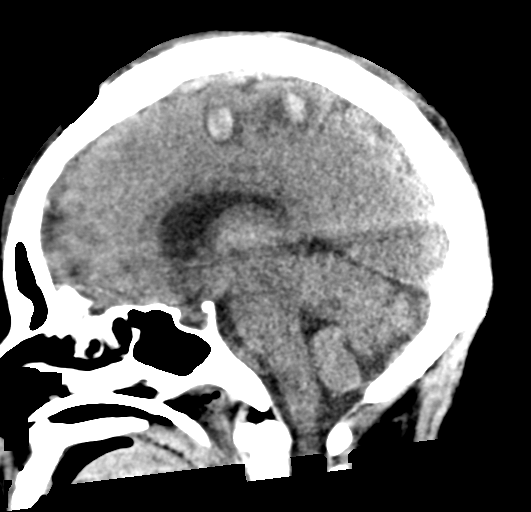
[im 39/59  brain]
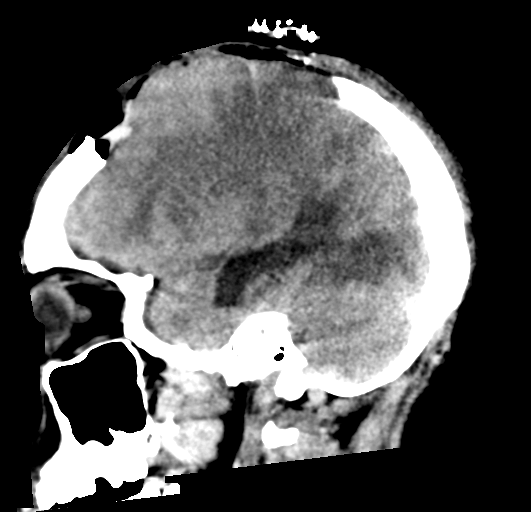

[17 of 47 positions shown; findings below may reference images not displayed]

FINDINGS: Brain: Interval craniectomy flap on the right with improved midline
positioning. A small subcortical hemorrhage has developed in the
subcortical left frontal parietal region a small volume of
hemorrhage is seen behind the cerebellar vermis. Progressive edema
with hemorrhage hemorrhage superiorly into the right frontal
parietal region. Similar degree of hemorrhage at the previously
infarcted area more inferiorly in the right hemisphere. Now
symmetric lateral ventricular volume. There is a degree of lateral
ventriculomegaly without visible obstructing process.

Vascular: The superior sagittal sinus measures denser than the
others dural sinuses and recommendation for interrogation
recommended.

Skull: Craniectomy flap.

Sinuses/Orbits: Negative

These results were called by telephone at the time of interpretation
on [DATE] at [DATE] to Dr NOSHEEN, Who verbally acknowledged
these results.
IMPRESSION: 1. Progressive edema with hemorrhage into the right frontal parietal
region now reaching the ACA territory. Newly seen subcortical
hemorrhage in the left frontal region. Findings are not clearly
explained by resolved midline shift, recommend venous interrogation
to exclude dural sinus thrombosis.
2. Craniectomy with essentially resolved midline shift.
3. Mild lateral ventriculomegaly.

## 2020-08-10 IMAGING — DX DG ABD PORTABLE 1V
1 series · 1 of 1 positions shown · non-contrast
Comparison: Earlier radiograph dated [DATE].

CLINICAL DATA: 39-year-old female status post enteric tube
placement.

EXAM:
PORTABLE ABDOMEN - 1 VIEW

[abdomen kub]
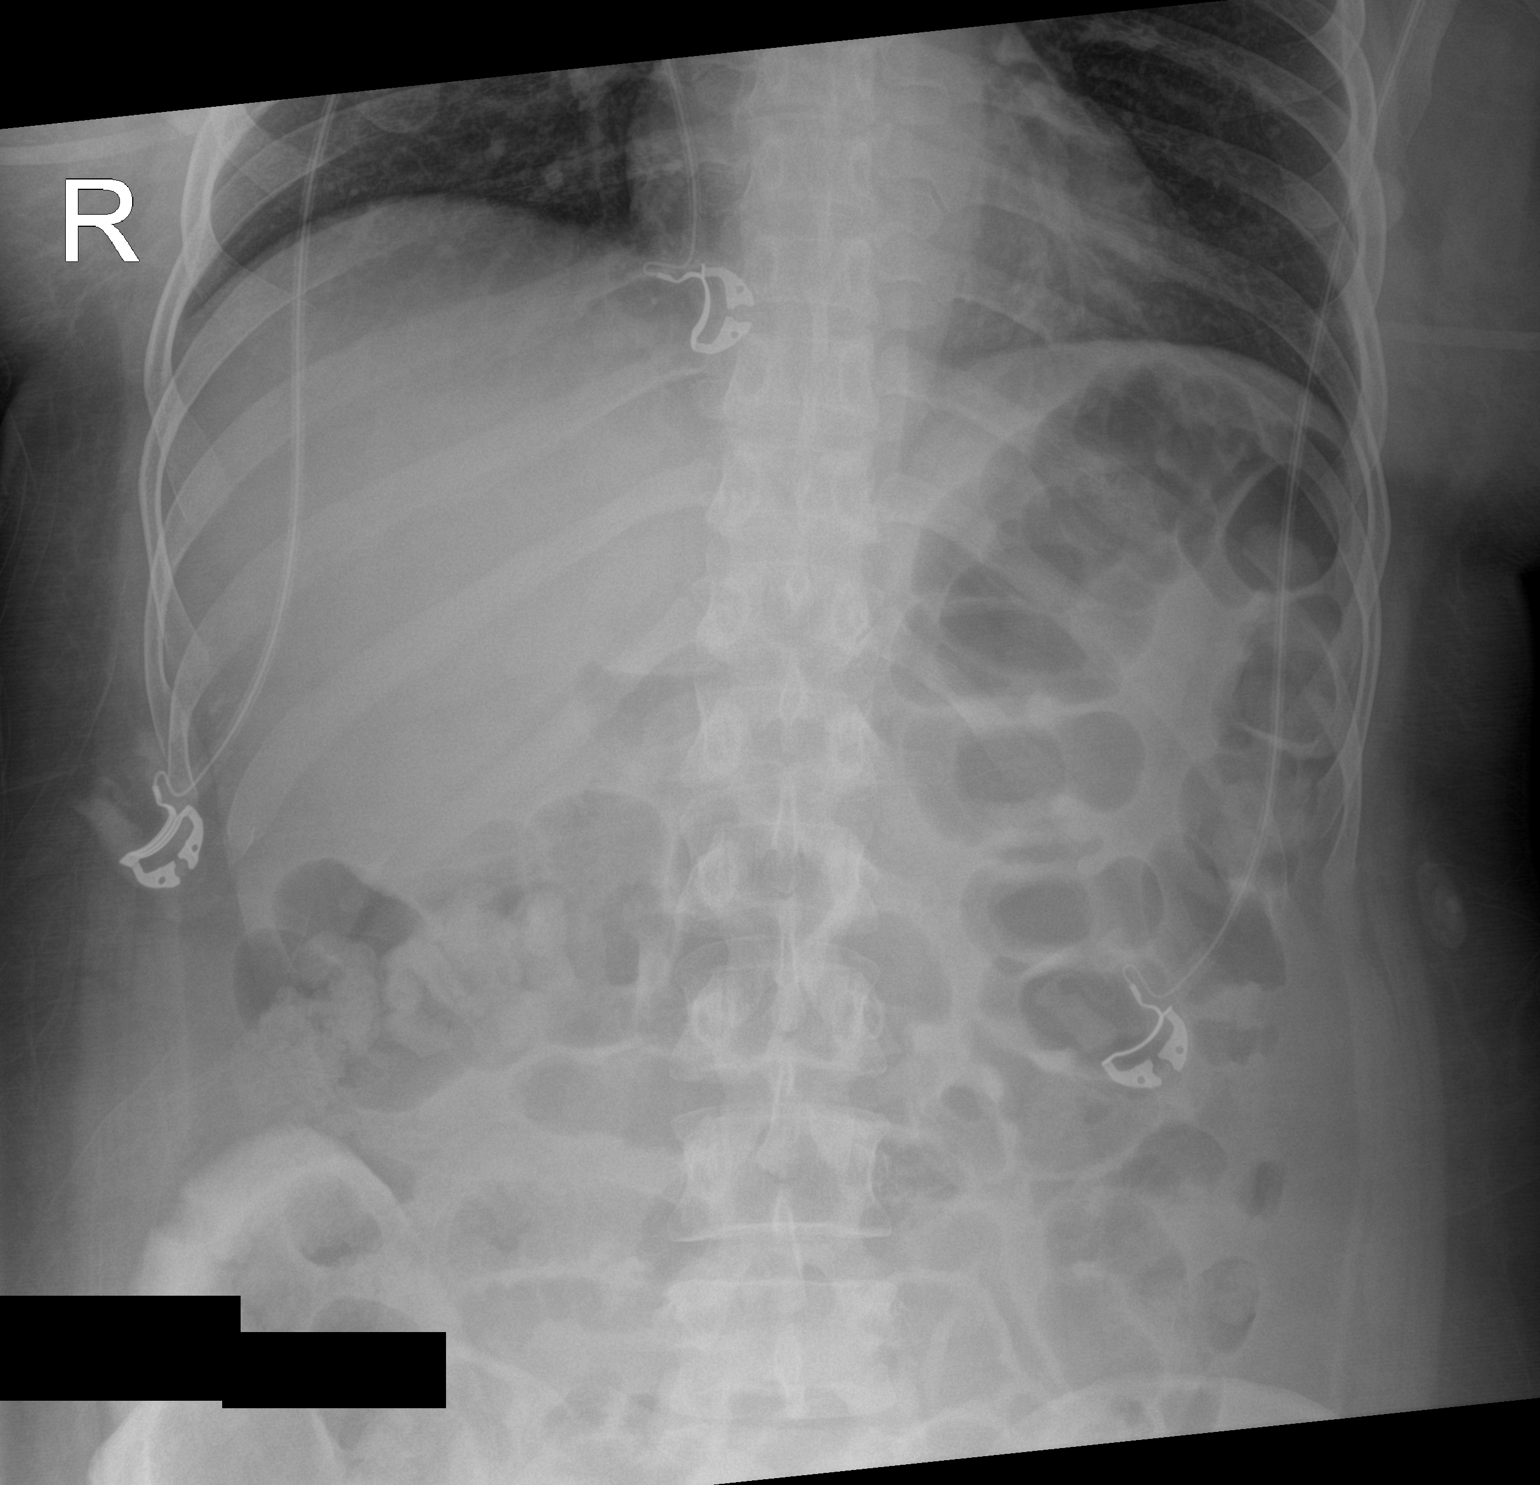

[1 of 1 positions shown; findings below may reference images not displayed]

FINDINGS: The previously seen feeding tube has been removed and not visualized
on this radiograph. No bowel dilatation or evidence of obstruction.
Cutaneous surgical clips noted over the right lower quadrant. The
osseous structures are intact.
IMPRESSION: Interval removal of the feeding tube. No bowel dilatation.

## 2020-08-10 IMAGING — DX DG ABDOMEN 1V
1 series · 1 of 1 positions shown · non-contrast
Comparison: [DATE]

CLINICAL DATA: NG tube placement.

EXAM:
ABDOMEN - 1 VIEW

[abdomen supine]
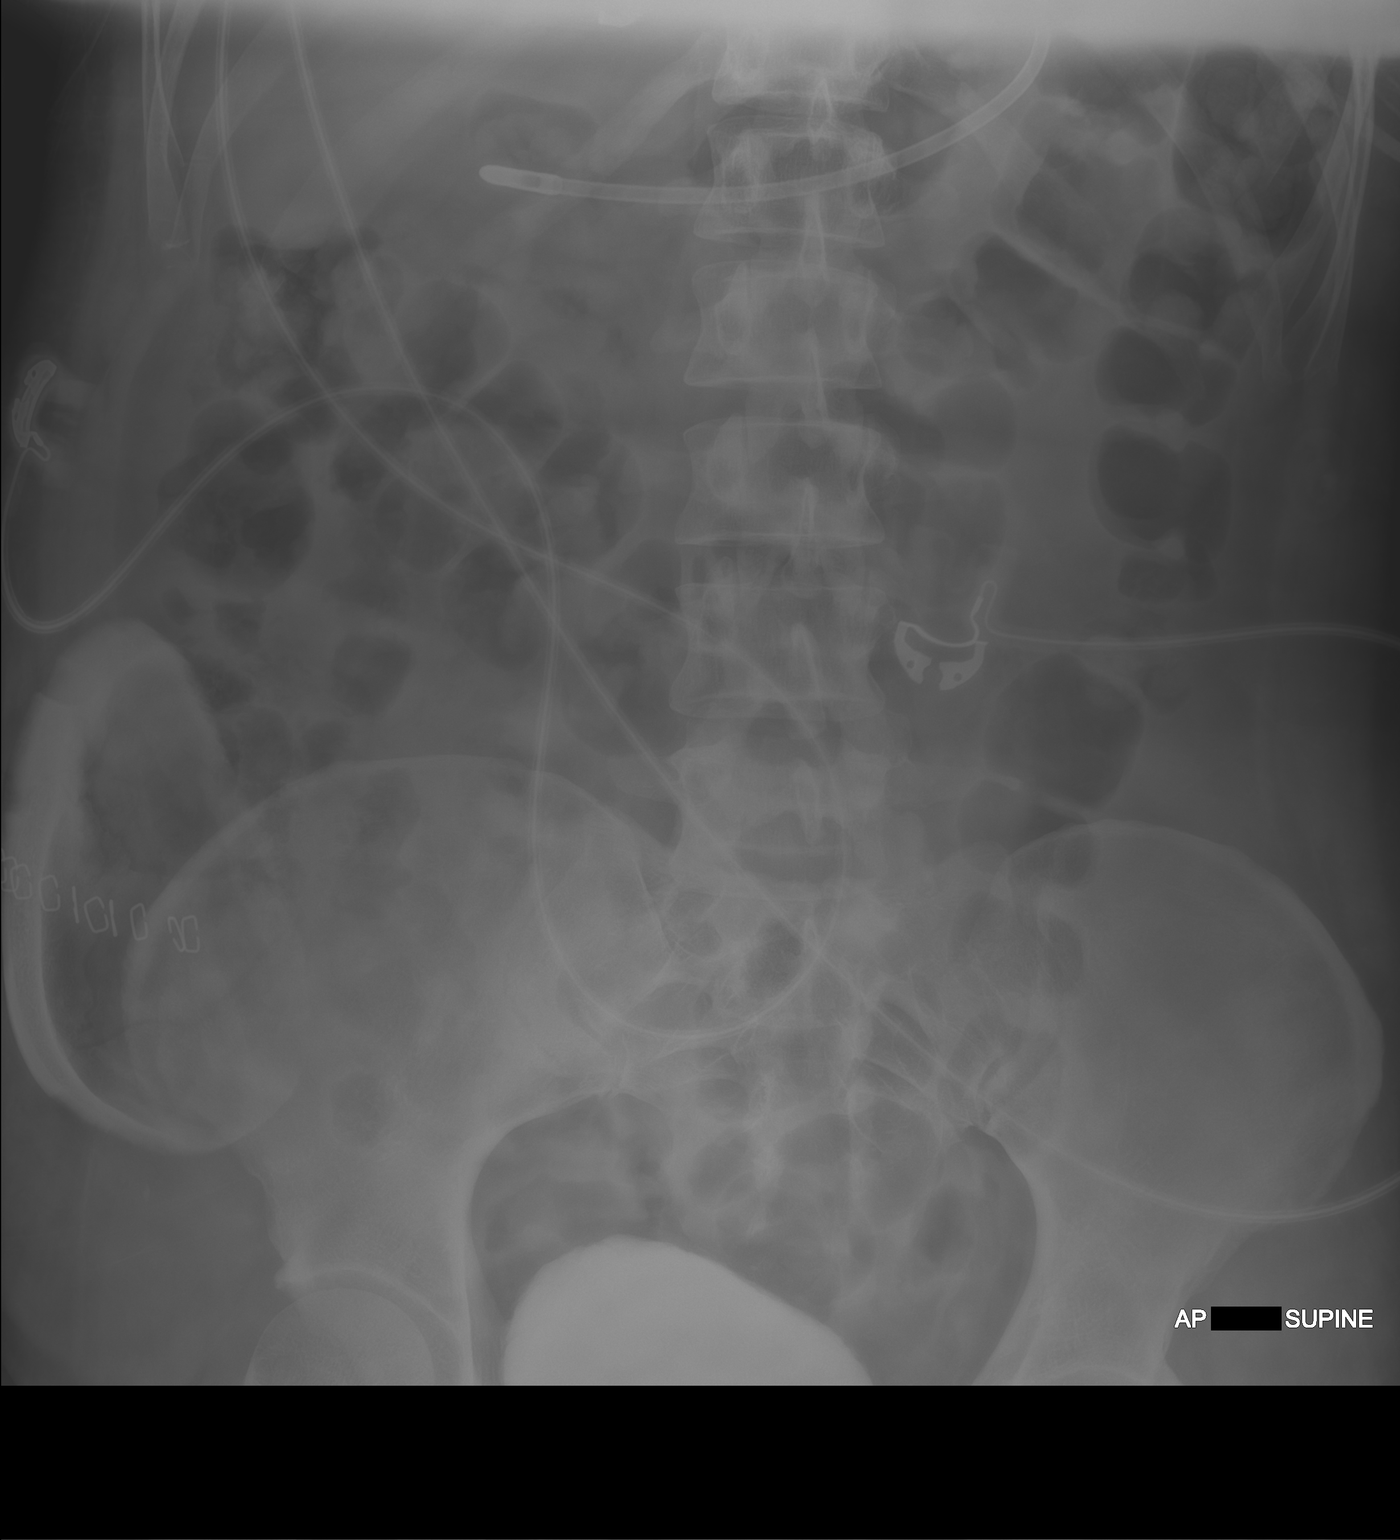

[1 of 1 positions shown; findings below may reference images not displayed]

FINDINGS: Limited view of the abdomen demonstrates the tip of feeding catheter
at the expected location of the gastric antrum.
Nonobstructive/nonspecific bowel gas pattern.

Contrast opacification of the urinary bladder.

No radio-opaque calculi or other significant radiographic
abnormality are seen.
IMPRESSION: Tip of feeding catheter at the expected location of the gastric
antrum.

## 2020-08-10 MED ORDER — PANCRELIPASE (LIP-PROT-AMYL) 10440-39150 UNITS PO TABS
20880.0000 [IU] | ORAL_TABLET | Freq: Once | ORAL | Status: DC
  Filled 2020-08-10: qty 2

## 2020-08-10 MED ORDER — SODIUM BICARBONATE 650 MG PO TABS
650.0000 mg | ORAL_TABLET | Freq: Once | ORAL | Status: DC
  Filled 2020-08-10: qty 1

## 2020-08-10 MED ORDER — SODIUM CHLORIDE 0.45 % IV SOLN: INTRAVENOUS | Status: DC

## 2020-08-10 MED ORDER — POTASSIUM CHLORIDE 20 MEQ PO PACK: 40.0000 meq | PACK | ORAL | Status: DC

## 2020-08-10 MED ORDER — LABETALOL HCL 5 MG/ML IV SOLN
5.0000 mg | INTRAVENOUS | Status: DC | PRN
  Administered 2020-08-11: 20 mg via INTRAVENOUS
  Administered 2020-08-11: 5 mg via INTRAVENOUS
  Administered 2020-08-11: 10 mg via INTRAVENOUS
  Administered 2020-08-12 – 2020-08-13 (×7): 20 mg via INTRAVENOUS
  Filled 2020-08-10 (×10): qty 4

## 2020-08-10 MED ORDER — SENNOSIDES-DOCUSATE SODIUM 8.6-50 MG PO TABS
1.0000 | ORAL_TABLET | Freq: Two times a day (BID) | ORAL | Status: DC
  Administered 2020-08-11 – 2020-08-13 (×4): 1
  Filled 2020-08-10 (×5): qty 1

## 2020-08-10 MED ORDER — POTASSIUM CHLORIDE 10 MEQ/100ML IV SOLN
10.0000 meq | INTRAVENOUS | Status: AC
  Administered 2020-08-10 (×4): 10 meq via INTRAVENOUS
  Filled 2020-08-10 (×4): qty 100

## 2020-08-10 MED ORDER — PANTOPRAZOLE SODIUM 40 MG PO PACK
40.0000 mg | PACK | Freq: Every day | ORAL | Status: DC
  Administered 2020-08-11 – 2020-08-16 (×6): 40 mg
  Filled 2020-08-10 (×6): qty 20

## 2020-08-10 MED ORDER — SODIUM CHLORIDE 0.9% IV SOLUTION: Freq: Once | INTRAVENOUS | Status: AC

## 2020-08-10 MED ORDER — ATORVASTATIN CALCIUM 80 MG PO TABS
80.0000 mg | ORAL_TABLET | Freq: Every day | ORAL | Status: DC
  Administered 2020-08-11 – 2020-08-14 (×4): 80 mg
  Filled 2020-08-10 (×4): qty 1

## 2020-08-10 MED ORDER — POTASSIUM CHLORIDE 20 MEQ PO PACK
40.0000 meq | PACK | ORAL | Status: DC
  Filled 2020-08-10: qty 2

## 2020-08-10 MED ORDER — IOHEXOL 350 MG/ML SOLN
75.0000 mL | Freq: Once | INTRAVENOUS | Status: AC | PRN
  Administered 2020-08-10: 75 mL via INTRAVENOUS

## 2020-08-10 NOTE — Progress Notes (Addendum)
Patient cortrak occluded, MD notified and unclogging protocol followed. Attempted to instill enzymatic solution in cortrak x2 with no success. OG tube placed, will obtain imaging to verify placement per protocol.  1530: OG tube advanced per imaging recommendations, cortrak removed and placed in bio bag at bedside  1700: OG not flushing despite advancement, tube re-adjusted and imaging now showing OG to not be in place. Will remove OG. CCM MD notified, ortrak team available tomorrow and can explore replacing cortrak.  Aris Lot, RN

## 2020-08-10 NOTE — Progress Notes (Signed)
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 Will discuss with neurosurgery about anticoagulation, embolectomy is another option.  Erick Blinks [facility] Pager Number [phone]

## 2020-08-10 NOTE — Progress Notes (Signed)
NAME:  Regina Morris, MRN:  [id number], DOB:  11-Sep-1980, LOS: 3 ADMISSION DATE:  07/27/2020, CONSULTATION DATE:  08/10/2020 REFERRING MD:  Stroke, Md, MD, CHIEF COMPLAINT:  Respiratory failure, tsroke  History of Present Illness:  Regina Morris is a 40 y.o. woman who presented with headache nausea and vomiting to [facility] on 5/26. 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 Transferred to [facility]. Started on keppra. Mental status worsened 5/27 evening and the morning of 5/28 CT head showed worsening midline shift. She was taken to the OR for decompressive hemicraniotomy with a pupillar exam concerning for impending herniation. She had rsection of non-viable brain tissue during hemicraniotomy and a bone flap was placed when she was closed.   History is obtained from chart review as the patient is intubated and not able to provide response.   Pertinent  Medical History  Gestational diabetes Nephrolithiasis  Significant Hospital Events: Including procedures, antibiotic start and stop dates in addition to other pertinent events   . 5/26 Presented to [facility] with headache, nausea, vomiting . 5/26 transferred to [facility], started on keppra and HTS . 5/27 possible neurologic decline, repeat CT head shows stability of bleeding, but increase in edema . 5/27 EEG epileptogenecity in the right frontal region and cortical dysfunction of the right hemisphere, no seizures . 5/27 cortrak placed . 5/28 overnight worsening midline shift and change in neuro exam, plan for decompressive hemicraniotomy  Interim History / Subjective:    Objective   Blood pressure 140/71, pulse (!) 117, temperature (!) 100.8 F (38.2 C), temperature source Axillary, resp. rate (!) 21, height 5\' 6"  (1.676 m), weight 71 kg, last menstrual period 08/05/2020, SpO2 100 %.    Vent Mode: PSV FiO2 (%):  [30 %-100 %]  30 % Set Rate:  [16 bmp] 16 bmp Vt Set:  [470 mL] 470 mL PEEP:  [5 cmH20] 5 cmH20 Pressure Support:  [8 cmH20] 8 cmH20 Plateau Pressure:  [11 cmH20-14 cmH20] 11 cmH20   Intake/Output Summary (Last 24 hours) at 08/10/2020 1144 Last data filed at 08/10/2020 0700 Gross per 24 hour  Intake 2898.36 ml  Output 1875 ml  Net 1023.36 ml   Filed Weights   08/12/2020 1400 07/27/2020 0500 07/16/2020 1216  Weight: 73.7 kg 71 kg 71 kg    Examination: General: intubated, sedated HENT: right hemicraniotomy with bone flap in place Lungs: ctab no wheezes or crackles Cardiovascular:tachycardic, regular Abdomen: soft, nontender Extremities: no edema Neuro: not responsive  Labs/imaging that I havepersonally reviewed  (right click and "Reselect all SmartList Selections" daily)  CT head personally reviewed from today right MCA infarct with hemorrhage with midline shift CT venogram with dural venous sinus thrombus  Resolved Hospital Problem list     Assessment & Plan:   Acute right MCA territory hemorrhage with edema, midline shift POD #1 right decompressive hemicraniotomy with bone flap Acute hypoxemic respiratory failure Dural venous sinus thrombus - Returned to ICU intubated post-op. Maintain lung protective ventilation - Na goal 150-155, currently 157 - continue nicardipine goal SBP<160 - continue fentanyl gtt - starting heparin gtt for thrombus after discussion with nsgy and neurology. Risks vs benefits of anticoagulation discussed. Poor prognosis.   Acute blood loss anemia -Acute illness, intracranial hemorrhage with post-operative losses -transfuse goal>7 - received 1 unit prbc this morning.    Best practice (right click and "Reselect all SmartList  Selections" daily)  Diet:  Tube Feed  Pain/Anxiety/Delirium protocol (if indicated): Yes (RASS goal -1) VAP protocol (if indicated): Yes DVT prophylaxis: Contraindicated GI prophylaxis: H2B Glucose control:  SSI Yes Central venous  access:  N/A Arterial line:  Yes, and it is still needed Foley:  Yes, and it is still needed Mobility:  bed rest  PT consulted: N/A Last date of multidisciplinary goals of care discussion [updated husband and daughter at bedside 5/28] Code Status:  full code Disposition: ICU  Labs   CBC: Recent Labs  Lab 08/06/20 2048 08/08/20 0253 08/11/20 0250 08/11/20 1533 11-Aug-2020 1654 08/10/20 0619  WBC 20.5* 17.6* 16.4*  --   --  21.3*  HGB 9.3* 8.5* 8.6* 7.5* 6.8* 6.2*  HCT 34.2* 32.5* 32.2* 22.0* 20.0* 23.8*  MCV 60.4* 61.7* 62.0*  --   --  63.1*  PLT 715* 549* 524*  --   --  400    Basic Metabolic Panel: Recent Labs  Lab 08/06/20 2048 07/22/2020 1520 08/08/20 0253 08/08/20 0939 Aug 11, 2020 0250 2020/08/11 0914 2020/08/11 1533 11-Aug-2020 1653 08-11-2020 1654 2020-08-11 2114 08/10/20 0147 08/10/20 0619  NA 135   < > 143   < > 154*   < > 157* 155* 157* 158* 161* 160*  K 3.7  --  3.6  --  4.2  --  3.5  --  3.4*  --   --  2.6*  CL 100  --  113*  --  119*  --   --   --   --   --   --  >130*  CO2 20*  --  20*  --  21*  --   --   --   --   --   --  23  GLUCOSE 290*  --  194*  --  182*  --   --   --   --   --   --  125*  BUN 9  --  10  --  5*  --   --   --   --   --   --  9  CREATININE 0.61  --  0.53  --  0.51  --   --   --   --   --   --  0.70  CALCIUM 8.6*  --  8.2*  --  8.9  --   --   --   --   --   --  8.0*  MG  --   --  2.0  --   --   --   --   --   --   --   --   --   PHOS  --   --  1.9*  --   --   --   --   --   --   --   --   --    < > = values in this interval not displayed.   GFR: Estimated Creatinine Clearance: 89.3 mL/min (by C-G formula based on SCr of 0.7 mg/dL). Recent Labs  Lab 08/06/20 2048 08/08/20 0253 Aug 11, 2020 0250 08/10/20 0619  PROCALCITON <0.10  --   --   --   WBC 20.5* 17.6* 16.4* 21.3*    Liver Function Tests: Recent Labs  Lab 08/06/20 2048  AST 16  ALT 13  ALKPHOS 89  BILITOT 1.1  PROT 7.7  ALBUMIN 3.5   Recent Labs  Lab 08/06/20 2048  LIPASE  21   No results for input(s): AMMONIA  in the last 168 hours.  ABG    Component Value Date/Time   PHART 7.422 07/16/2020 1654   PCO2ART 30.1 (L) 07/31/2020 1654   PO2ART 480 (H) 08/06/2020 1654   HCO3 19.5 (L) 07/13/2020 1654   TCO2 20 (L) 08/02/2020 1654   ACIDBASEDEF 4.0 (H) 07/17/2020 1654   O2SAT 100.0 07/16/2020 1654     Coagulation Profile: Recent Labs  Lab 08/01/2020 0410  INR 1.1    Cardiac Enzymes: No results for input(s): CKTOTAL, CKMB, CKMBINDEX, TROPONINI in the last 168 hours.  HbA1C: Hgb A1c MFr Bld  Date/Time Value Ref Range Status  07/14/2020 07:55 AM 12.9 (H) 4.8 - 5.6 % Final    Comment:    (NOTE)         Prediabetes: 5.7 - 6.4         Diabetes: >6.4         Glycemic control for adults with diabetes: <7.0   12/12/2019 09:57 PM 11.7 (H) 4.8 - 5.6 % Final    Comment:    (NOTE) Pre diabetes:          5.7%-6.4%  Diabetes:              >6.4%  Glycemic control for   <7.0% adults with diabetes      Critical care time: 38    The patient is critically ill due to respiratory failure, brain herniation, subarachnoid hemorrhage.  Critical care was necessary to treat or prevent imminent or life-threatening deterioration.  Critical care was time spent personally by me on the following activities: development of treatment plan with patient and/or surrogate as well as nursing, discussions with consultants, evaluation of patient's response to treatment, examination of patient, obtaining history from patient or surrogate, ordering and performing treatments and interventions, ordering and review of laboratory studies, ordering and review of radiographic studies, pulse oximetry, re-evaluation of patient's condition and participation in multidisciplinary rounds.   Critical Care Time devoted to patient care services described in this note is 32 minutes. This time reflects time of care of this signee Charlott Holler . This critical care time does not reflect separately  billable procedures or procedure time, teaching time or supervisory time of PA/NP/Med student/Med Resident etc but could involve care discussion time.       Charlott Holler [facility] 08/10/2020 11:44 AM  Pager: see AMION  If no response to pager , please call critical care on call (see AMION) until 7pm After 7:00 pm call Elink

## 2020-08-10 NOTE — Progress Notes (Signed)
  Test: Sodium  Critical Value: 161  Name of Provider Notified: Dr Derry Lory  Orders Received? Or Actions Taken?:  Stop 3% and redraw sodium lab in 4 hours.

## 2020-08-10 NOTE — Progress Notes (Signed)
SLP Cancellation Note  Patient Details Name: Chezney Huether MRN: [id number] DOB: 1980/04/15   Cancelled treatment:       Reason Eval/Treat Not Completed: Patient not medically ready. Since initial eval pt has had craniotomy and is intubated per chart. Will follow for readiness   Claudine Mouton 08/10/2020, 8:34 AM

## 2020-08-10 NOTE — Progress Notes (Addendum)
STROKE TEAM PROGRESS NOTE   INTERVAL HISTORY RN at the bedside. Overnight CT showed post right [facility] with resolved midline shift. However, there were right parafalcine right frontal ICH as well small left frontal subcortical ICH, concerning for CSVT. Subsequent CTV showed SSS and right transverse and sigmoid sinus thrombosis. CTA head and neck again showed attenuated right MCA branches with right M1 high grade stenosis and lack of branches on the right. Pt still intubated and on low dose sedation, barely open eyes on voice and pain, however, PERRL now and eye disconjugate much improved. Hb was low at 6.2, receiving PRBC.   Vitals:   08/10/20 0600 08/10/20 0700 08/10/20 0800 08/10/20 0945  BP: 132/85 136/73  137/75  Pulse: 91 89  (!) 106  Resp: 18 20  20   Temp:   (!) 101.2 F (38.4 C) (!) 101.1 F (38.4 C)  TempSrc:   Axillary Oral  SpO2: 100% 100%  100%  Weight:      Height:       CBC:  Recent Labs  Lab 09/03/2020 0250 Sep 03, 2020 1533 2020-09-03 1654 08/10/20 0619  WBC 16.4*  --   --  21.3*  HGB 8.6*   < > 6.8* 6.2*  HCT 32.2*   < > 20.0* 23.8*  MCV 62.0*  --   --  63.1*  PLT 524*  --   --  400   < > = values in this interval not displayed.   Basic Metabolic Panel:  Recent Labs  Lab 08/08/20 0253 08/08/20 0939 09-03-2020 0250 09/03/20 0914 2020-09-03 1654 2020/09/03 2114 08/10/20 0147 08/10/20 0619  NA 143   < > 154*   < > 157*   < > 161* 160*  K 3.6  --  4.2   < > 3.4*  --   --  2.6*  CL 113*  --  119*  --   --   --   --  >130*  CO2 20*  --  21*  --   --   --   --  23  GLUCOSE 194*  --  182*  --   --   --   --  125*  BUN 10  --  5*  --   --   --   --  9  CREATININE 0.53  --  0.51  --   --   --   --  0.70  CALCIUM 8.2*  --  8.9  --   --   --   --  8.0*  MG 2.0  --   --   --   --   --   --   --   PHOS 1.9*  --   --   --   --   --   --   --    < > = values in this interval not displayed.   Lipid Panel:  Recent Labs  Lab 07/17/2020 0755  CHOL 276*  TRIG 168*  HDL 45   CHOLHDL 6.1  VLDL 34  LDLCALC 08/02/2020*   HgbA1c:  Recent Labs  Lab 08/05/2020 0755  HGBA1C 12.9*   Urine Drug Screen:  Recent Labs  Lab 07/14/2020 1258  LABOPIA NONE DETECTED  COCAINSCRNUR NONE DETECTED  LABBENZ NONE DETECTED  AMPHETMU NONE DETECTED  THCU NONE DETECTED  LABBARB NONE DETECTED    Alcohol Level  Recent Labs  Lab 07/24/2020 0424  ETH <10    IMAGING  CT ABDOMEN PELVIS WO CONTRAST Result Date: 08/04/2020 IMPRESSION:  No acute intra-abdominal pathology identified.   DG Chest 2 View Result Date: 07/30/2020 MPRESSION: No active cardiopulmonary disease.   CT Head Wo Contrast Result Date: 07/25/2020 IMPRESSION: Large posterior right MCA territory infarct involving the posterior right temporal cortex with superimposed moderate subarachnoid hemorrhage interdigitating within the sulci of the infarcted region and tiny subdural hematoma. Mild mass effect. No midline shift.   MR ANGIO HEAD WO CONTRAST Result Date: 08/01/2020 IMPRESSION:  1. Significantly degraded by motion.  2. Narrowed/irregularity right M2 branches correlating with the acute infarct. No detectable arterial disease elsewhere.   MR BRAIN WO CONTRAST Result Date: 08/05/2020 CT. IMPRESSION:  1. Acute infarct involving the inferior division right MCA territory with petechial hemorrhage and 4 mm midline shift.  2. Diffusion only study due to patient condition.    PHYSICAL EXAM  Temp:  [97.9 F (36.6 C)-101.2 F (38.4 C)] 101.1 F (38.4 C) (05/29 0945) Pulse Rate:  [49-117] 106 (05/29 0945) Resp:  [16-27] 20 (05/29 0945) BP: (91-158)/(53-85) 137/75 (05/29 0945) SpO2:  [96 %-100 %] 100 % (05/29 0945) FiO2 (%):  [30 %-100 %] 30 % (05/29 0945) Weight:  [71 kg] 71 kg (05/28 1216)  General - Well nourished, well developed, intubated on low dose fentanyl.  Ophthalmologic - fundi not visualized due to noncooperation.  Cardiovascular - Regular rhythm but tachycardia.  Neuro - intubated on low dose  fentanyl, eyes closed but barely open on voice and pain stimulation, not following commands. With forced eye opening, eyes in mid position, not blinking to visual threat, doll's eyes present, not tracking, PERRL, no significant disconjugate. Corneal reflex weakly present, gag and cough present. Breathing over the vent.  Facial symmetry not able to test due to ET tube.  Tongue protrusion not cooperative. On pain stimulation, mild withdraw in all extremities. DTR diminished and no babinski. Sensation, coordination and gait not tested.   ASSESSMENT/PLAN Ms. Elbony Mcclimans is a 40 y.o. female with history of gestational diabetes, kidney stone  presenting with nausea, vomiting and R temple headache.  CT head demonstrated a right posterior MCA stroke with moderate subarachnoid hemorrhage interdigitating within the sulci of the infarcted region and tiny SDH with mild mass effect.   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   MRA Narrowed/irregularity right M2 branches correlating with the acute infarct.  CTA head and neck 5/29 - Very attenuated right MCA branches similar to prior MRA with high-grade right M1 segment narrowing and no enhancing vessels at the level of initially visualized infarct.  Carotid Doppler unremarkable  2D Echo EF 60-65%   SARs coronavirus negative  Hypercoag work up pending  TCD bubble study no PFO  LDL 197  HgbA1c 12.9  VTE prophylaxis - SCDs  No antithrombotic/anticoagulant prior to admission, no antithrombotics now due to hemorrhagic conversion and severe anemia needing PRBC  Therapy recommendations:  pending  Disposition:  Pending  Cerebral venous sinus thrombosis  ? Post procedure ? Procedure related  CT head 5/29 multifocal ICH with right  parietal, right frontal and left frontal  CTV 5/29 venous sinus thrombosis SSS and right transver-sigmoid drainage  discussed with NSG Dr. Valeta Harms - OK for anticoagulation  Discussed with Dr. Sherlon Handing - poor candidate for further intervention given current condition and needs anticoagulation even with procedure  Will consider to start  heparin IV tomorrow if Hb stabilized and if repeat imaging stable in am  Cerebral Edema Uncal herniation s/p [facility]  CT repeat showed MLS worsened to 8-66mm  Exam concerning for early uncal herniation on the right  S/p [facility] with Dr. Venetia Maxon (NSG)  CT repeat 5/29 - resolved MLS  On HTS at 75cc/hr -> 1/2NS @ 50  Na 135->136->140->143->149->154->156->160  MRI repeat in am  Questionable seizure  EEG potential seizure focus on the right  On Keppra 500mg  bid  Seizure precautions  Diabetes, uncontrolled   Home meds:  Metformin 500mg  daily  HgbA1c 12.9, goal < 7.0  CBGs  SSI  Diabetes coordinator consult  Hypertension  Home meds:  none  Stable . SBP goal < 160 due to hemorrhagic conversion . Long-term BP goal normotensive  Hyperlipidemia  Home meds:  none  LDL 197 goal < 70  On lipitor 80  Continue statin on discharge  Leukocytosis Fever   WBC 20->17.5->16.4->21.3  Tmax 99.9->100->101.2  UA neg  CXR neg  Blood culture pending  Severe anemia with acute blood loss  Baseline anemia   This admission, Hb 8.5->7.5->6.8->6.2  S/p PRBC  Hold off blood thinner at this time  Other Stroke Risk Factors    Other Active Problems  Thrombocytosis platelet 715->524->400  Hospital day #3  This patient is critically ill due to right MCA Infarct, cerebral venous sinus thrombosis, cerebral edema, severe anemia, uncontrolled DM and HLD, fever and at significant risk of neurological worsening, death form brain herniation, recurrent stroke, venous infarct, further brain bleed, anemia, seizure, sepsis. This patient's care  requires constant monitoring of vital signs, hemodynamics, respiratory and cardiac monitoring, review of multiple databases, neurological assessment, discussion with family, other specialists and medical decision making of high complexity. I spent 50 minutes of neurocritical care time in the care of this patient. I had long discussion with husband, daughter and family friend at bedside, updated pt current condition, treatment plan and potential prognosis, and answered all the questions. They expressed understanding and appreciation. Interpretation from daughter. I also discussed with Dr. and Dr. .   Valeta Harms, MD PhD Stroke Neurology 08/10/2020 10:08 AM   To contact Stroke Continuity provider, please refer to Marvel Plan. After hours, contact General Neurology

## 2020-08-10 NOTE — Progress Notes (Signed)
Neurosurgery Service Progress Note  Subjective: No acute events overnight   Objective: Vitals:   08/10/20 0700 08/10/20 0800 08/10/20 0945 08/10/20 1003  BP: 136/73  137/75 140/71  Pulse: 89  (!) 106 (!) 117  Resp: 20  20 (!) 21  Temp:  (!) 101.2 F (38.4 C) (!) 101.1 F (38.4 C) (!) 100.8 F (38.2 C)  TempSrc:  Axillary Oral Axillary  SpO2: 100%  100% 100%  Weight:      Height:        Physical Exam: Intubated, on fentanyl, pupils minimally reactive, maybe 81mm reactivity bilaterally, +bilateral corneals but take some effort to get, +c/g, withdrawing on left, unable to get w/d in RLE, minimal in RUE Incision c/d/i, flap full and somewhat tense  Assessment & Plan: 40 y.o. woman w/ likely venous sinus thrombosis versus arterial occlusion with venous thrombosis s/p R hemicraniectomy.   -okay for heparin gtt or endovascular Tx from a neurosurgical perspective, Hb low so will defer timing to primary team. Obviously risks involved with anticoagulation but the natural history of thrombus extension is worse than the risk of hemorrhage. -exam is poor, flap is fairly tense, with the imaging findings I unfortunately don't think she will do well, but she is very young which can help with recovery  Jadene Pierini  08/10/20 11:40 AM

## 2020-08-11 ENCOUNTER — Inpatient Hospital Stay (HOSPITAL_COMMUNITY): Payer: Self-pay

## 2020-08-11 DIAGNOSIS — Z515 Encounter for palliative care: Secondary | ICD-10-CM

## 2020-08-11 DIAGNOSIS — Z7189 Other specified counseling: Secondary | ICD-10-CM

## 2020-08-11 LAB — BASIC METABOLIC PANEL
Anion gap: 8 (ref 5–15)
BUN: 10 mg/dL (ref 6–20)
CO2: 21 mmol/L — ABNORMAL LOW (ref 22–32)
Calcium: 8 mg/dL — ABNORMAL LOW (ref 8.9–10.3)
Chloride: 126 mmol/L — ABNORMAL HIGH (ref 98–111)
Creatinine, Ser: 0.73 mg/dL (ref 0.44–1.00)
GFR, Estimated: >60 mL/min (ref >60)
Glucose, Bld: 175 mg/dL — ABNORMAL HIGH (ref 70–99)
Potassium: 3 mmol/L — ABNORMAL LOW (ref 3.5–5.1)
Sodium: 155 mmol/L — ABNORMAL HIGH (ref 135–145)

## 2020-08-11 LAB — POCT I-STAT 7, (LYTES, BLD GAS, ICA,H+H)
Acid-base deficit: 1 mmol/L (ref 0.0–2.0)
Bicarbonate: 23.9 mmol/L (ref 20.0–28.0)
Calcium, Ion: 1.18 mmol/L (ref 1.15–1.40)
HCT: 20 % — ABNORMAL LOW (ref 36.0–46.0)
Hemoglobin: 6.8 g/dL — CL (ref 12.0–15.0)
O2 Saturation: 100 %
Patient temperature: 99.3
Potassium: 3 mmol/L — ABNORMAL LOW (ref 3.5–5.1)
Sodium: 158 mmol/L — ABNORMAL HIGH (ref 135–145)
TCO2: 25 mmol/L (ref 22–32)
pCO2 arterial: 38 mmHg (ref 32.0–48.0)
pH, Arterial: 7.409 (ref 7.350–7.450)
pO2, Arterial: 169 mmHg — ABNORMAL HIGH (ref 83.0–108.0)

## 2020-08-11 LAB — CBC
HCT: 26.8 % — ABNORMAL LOW (ref 36.0–46.0)
Hemoglobin: 7.4 g/dL — ABNORMAL LOW (ref 12.0–15.0)
MCH: 18.5 pg — ABNORMAL LOW (ref 26.0–34.0)
MCHC: 27.6 g/dL — ABNORMAL LOW (ref 30.0–36.0)
MCV: 67 fL — ABNORMAL LOW (ref 80.0–100.0)
Platelets: 286 10*3/uL (ref 150–400)
RBC: 4 MIL/uL (ref 3.87–5.11)
RDW: 22.9 % — ABNORMAL HIGH (ref 11.5–15.5)
WBC: 17.4 10*3/uL — ABNORMAL HIGH (ref 4.0–10.5)
nRBC: 0 % (ref 0.0–0.2)

## 2020-08-11 LAB — PHOSPHORUS: Phosphorus: 2.2 mg/dL — ABNORMAL LOW (ref 2.5–4.6)

## 2020-08-11 LAB — GLUCOSE, CAPILLARY
Glucose-Capillary: 151 mg/dL — ABNORMAL HIGH (ref 70–99)
Glucose-Capillary: 160 mg/dL — ABNORMAL HIGH (ref 70–99)
Glucose-Capillary: 207 mg/dL — ABNORMAL HIGH (ref 70–99)
Glucose-Capillary: 183 mg/dL — ABNORMAL HIGH (ref 70–99)
Glucose-Capillary: 281 mg/dL — ABNORMAL HIGH (ref 70–99)
Glucose-Capillary: 284 mg/dL — ABNORMAL HIGH (ref 70–99)

## 2020-08-11 LAB — HEMOGLOBIN AND HEMATOCRIT, BLOOD
HCT: 27.3 % — ABNORMAL LOW (ref 36.0–46.0)
Hemoglobin: 7.7 g/dL — ABNORMAL LOW (ref 12.0–15.0)

## 2020-08-11 LAB — SODIUM
Sodium: 157 mmol/L — ABNORMAL HIGH (ref 135–145)
Sodium: 153 mmol/L — ABNORMAL HIGH (ref 135–145)
Sodium: 151 mmol/L — ABNORMAL HIGH (ref 135–145)

## 2020-08-11 LAB — HEPARIN LEVEL (UNFRACTIONATED): Heparin Unfractionated: <0.1 IU/mL — ABNORMAL LOW (ref 0.30–0.70)

## 2020-08-11 IMAGING — DX DG ABD PORTABLE 1V
1 series · 1 of 1 positions shown · non-contrast
Comparison: Abdominal radiograph [DATE].

CLINICAL DATA: 39-year-old female status post orogastric tube
placement.

EXAM:
PORTABLE ABDOMEN - 1 VIEW

[abdomen supine]
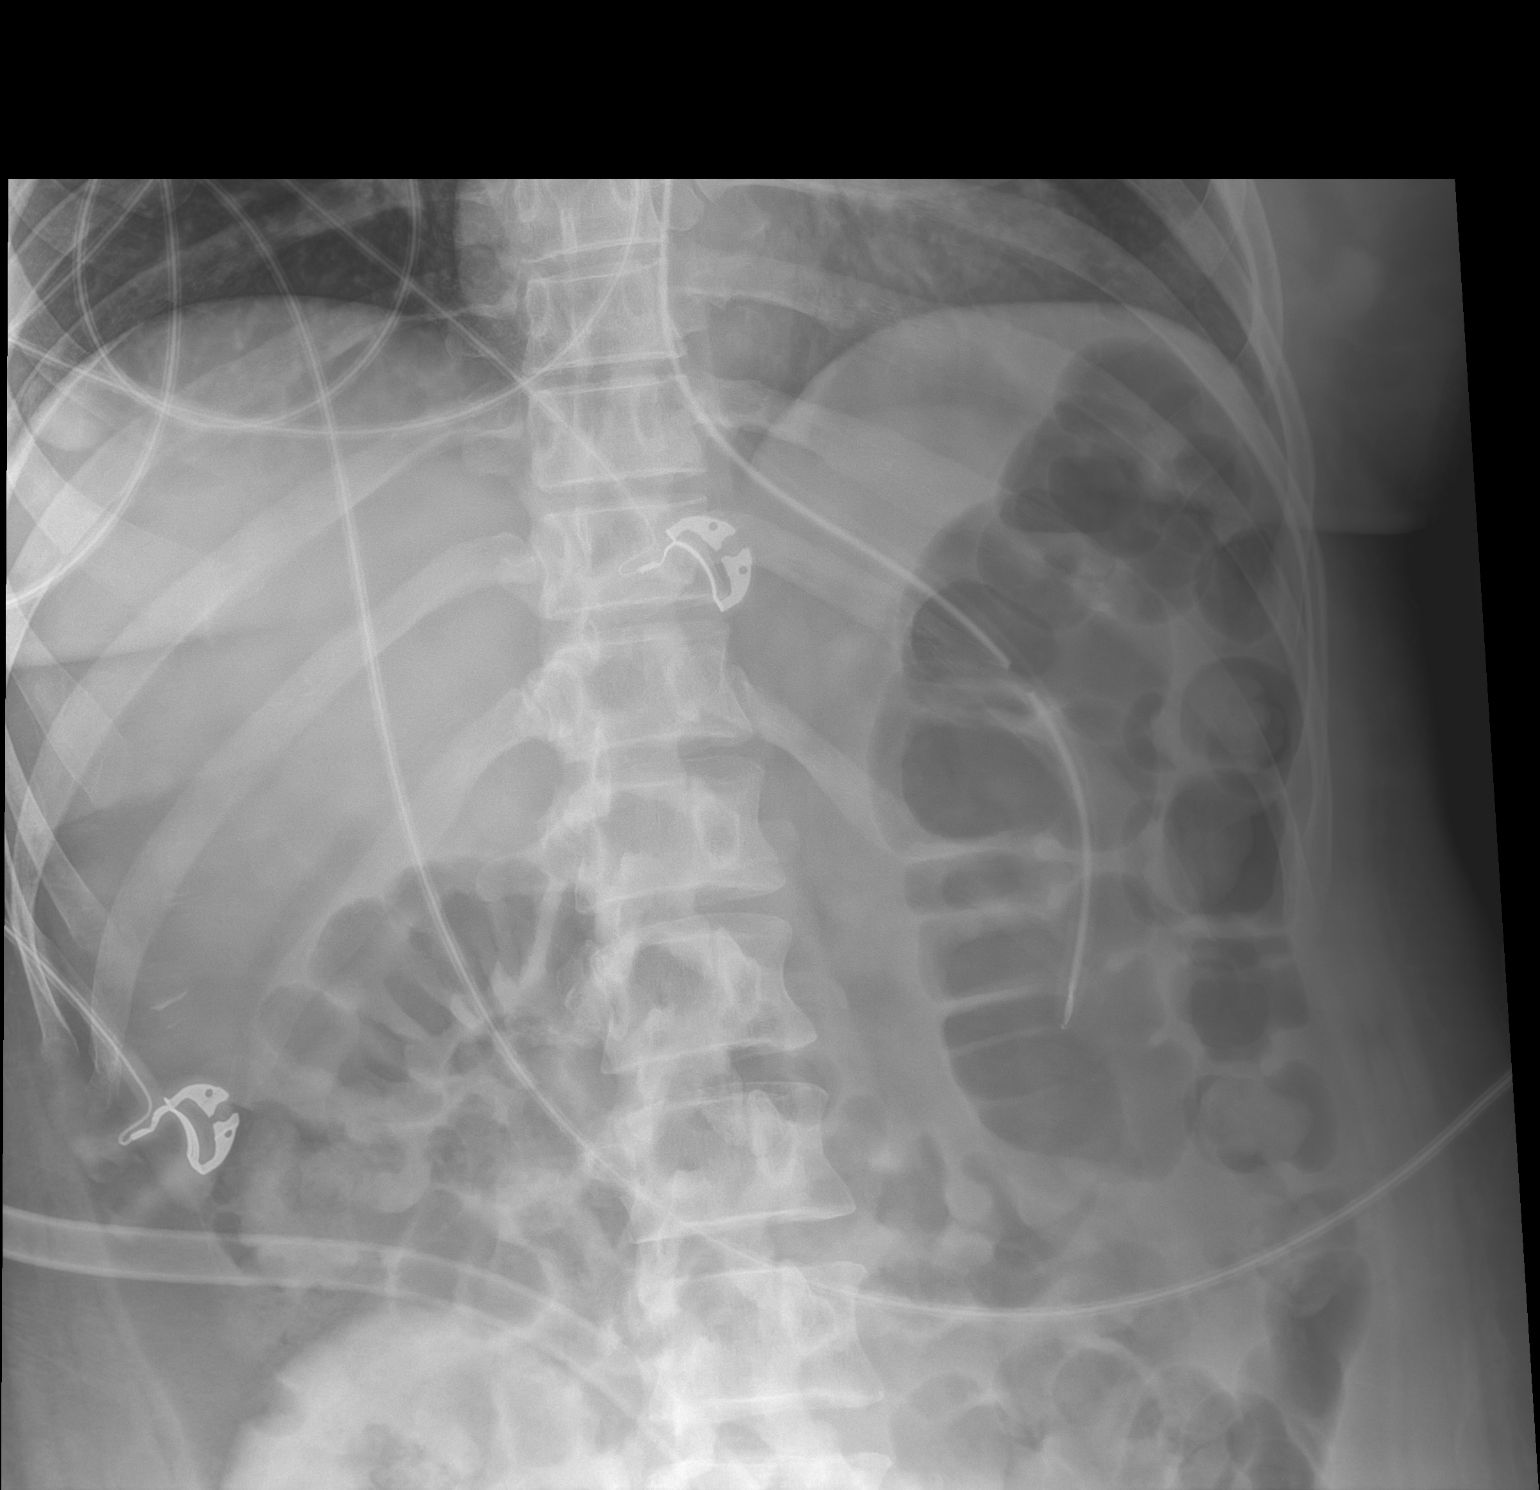

[1 of 1 positions shown; findings below may reference images not displayed]

FINDINGS: Enteric tube noted with tip and side port in the body of the
stomach. The bowel gas pattern is normal. No radio-opaque calculi or
other significant radiographic abnormality are seen.
IMPRESSION: 1. Enteric tube positioned with tip and side port in the body of the
stomach.
2. Nonobstructive bowel gas pattern.

## 2020-08-11 IMAGING — MR MR HEAD W/O CM
12 of 13 series · 44 of 48 positions shown · non-contrast
Comparison: CT head [DATE].

CLINICAL DATA: Stroke follow-up.

EXAM:
MRI HEAD WITHOUT CONTRAST
TECHNIQUE: Multiplanar, multiecho pulse sequences of the brain and surrounding
structures were obtained without intravenous contrast.

[Series 5: DWI · axial · 3.0mm · 0.88mm/px · z∈[-124,+18]mm · 8 of 100 slices shown (1 of 4)]
[im 1/100]
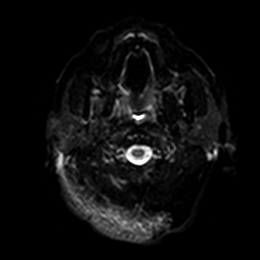
[im 15/100]
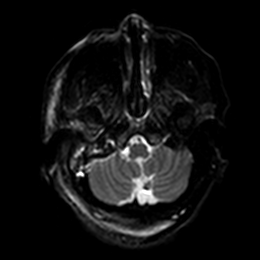
[im 29/100]
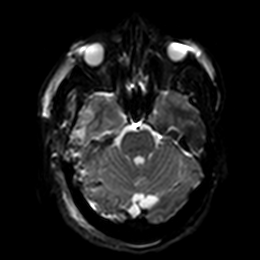
[im 43/100]
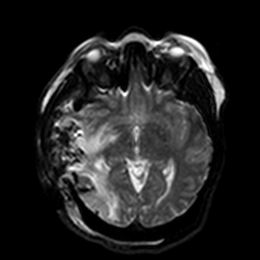
[im 57/100]
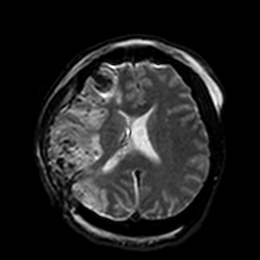
[im 71/100]
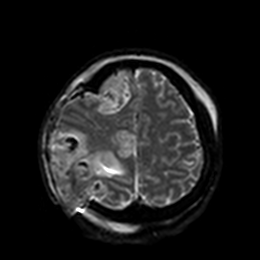
[im 85/100]
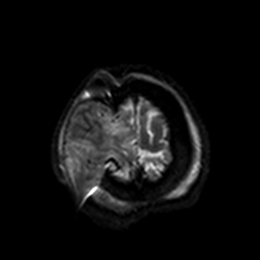
[im 100/100]
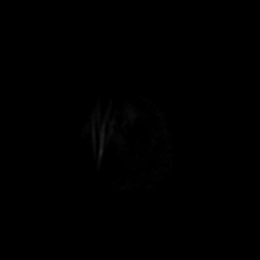

[Series 6: DWI · axial · 3.0mm · 0.88mm/px · z∈[-124,+18]mm · 5 of 49 slices shown (2 of 4)]
[im 1/49]
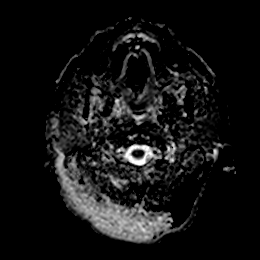
[im 13/49]
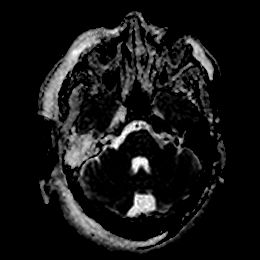
[im 25/49]
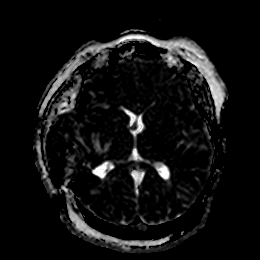
[im 37/49]
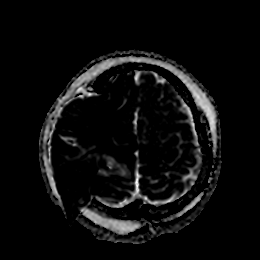
[im 49/49]
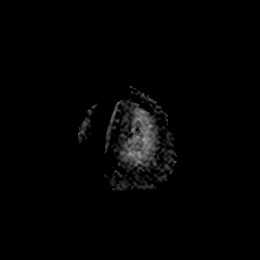

[Series 7: DWI · coronal · 4.0mm · 0.88mm/px · 5 of 68 slices shown (3 of 4)]
[im 1/68]
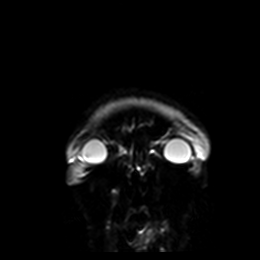
[im 17/68]
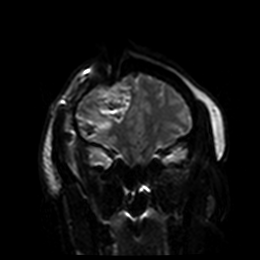
[im 34/68]
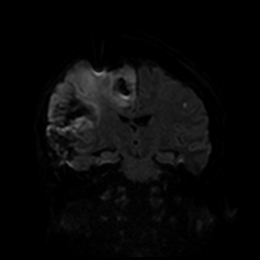
[im 51/68]
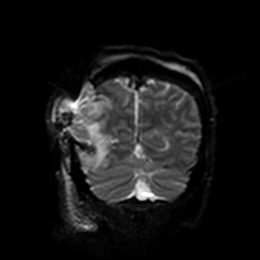
[im 68/68]
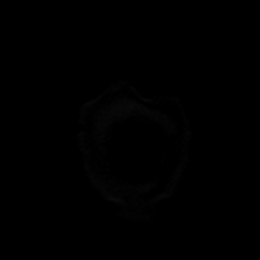

[Series 8: DWI · coronal · 4.0mm · 0.88mm/px · 2 of 33 slices shown (4 of 4)]
[im 1/33]
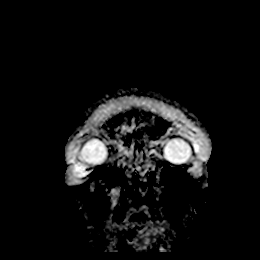
[im 33/33]
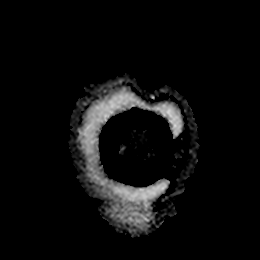

[Series 9: T1 · sagittal · 5.0mm · 0.75mm/px · 2 of 29 slices shown]
[im 1/29]
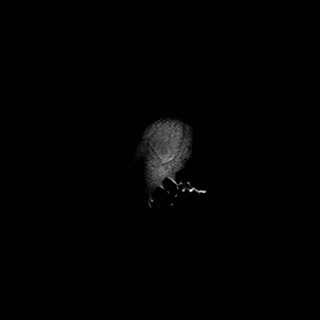
[im 29/29]
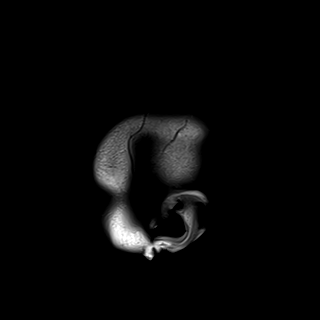

[Series 10: T2 · axial · 5.0mm · 0.72mm/px · z∈[-135,+25]mm · 2 of 29 slices shown (1 of 2)]
[im 1/29]
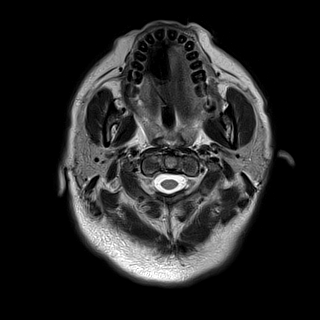
[im 29/29]
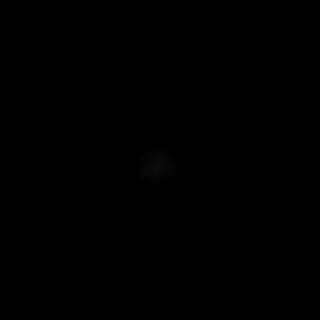

[Series 11: FLAIR · axial · 5.0mm · 0.45mm/px · z∈[-135,+26]mm · 2 of 29 slices shown]
[im 1/29]
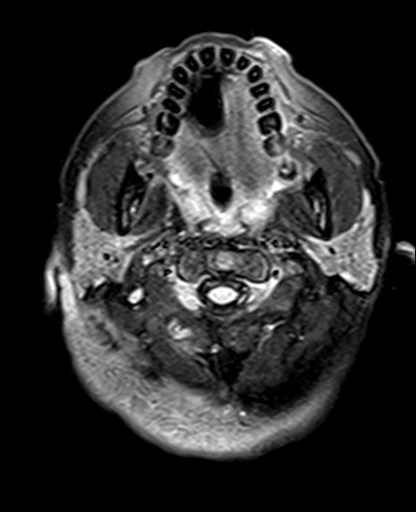
[im 29/29]
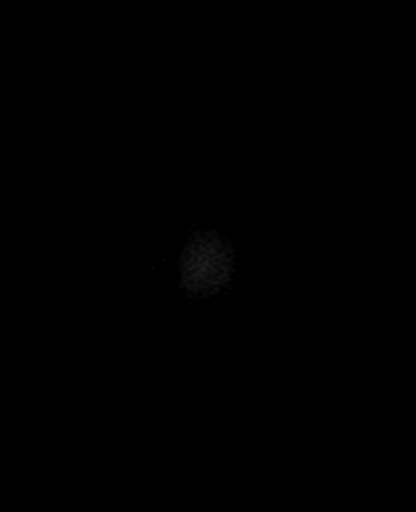

[Series 12: mag_images · axial · 3.0mm · 0.90mm/px · z∈[-139,+30]mm · 4 of 60 slices shown]
[im 1/60]
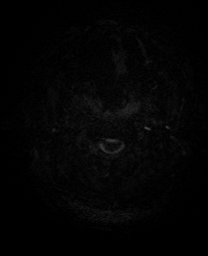
[im 20/60]
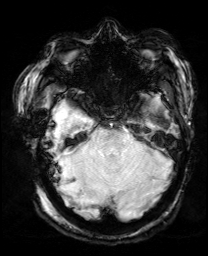
[im 40/60]
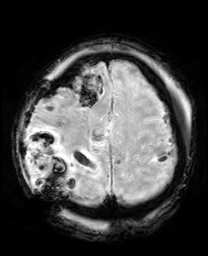
[im 60/60]
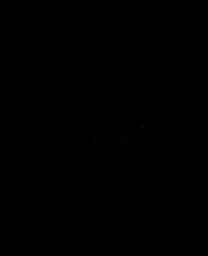

[Series 13: pha_images · axial · 3.0mm · 0.90mm/px · z∈[-139,+30]mm · 4 of 60 slices shown]
[im 1/60]
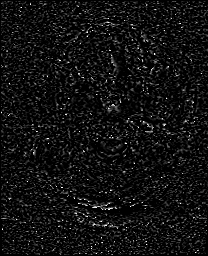
[im 20/60]
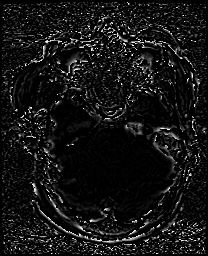
[im 40/60]
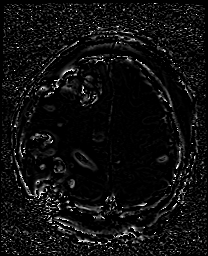
[im 60/60]
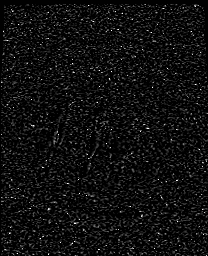

[Series 14: swi_images · axial · 3.0mm · 0.90mm/px · z∈[-139,+30]mm · 4 of 60 slices shown]
[im 1/60]
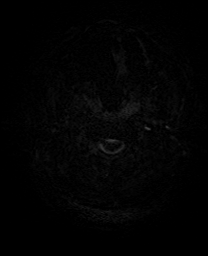
[im 20/60]
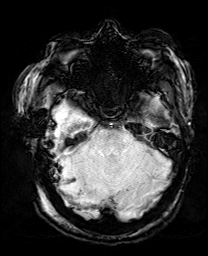
[im 40/60]
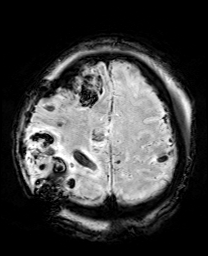
[im 60/60]
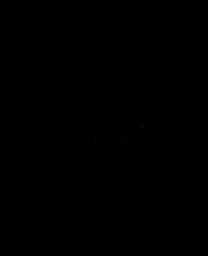

[Series 15: mip_images(sw) · axial · 24.0mm · 0.90mm/px · z∈[-129,+20]mm · 4 of 53 slices shown]
[im 1/53]
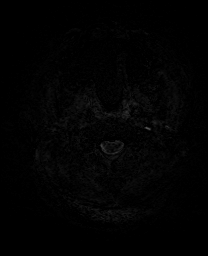
[im 18/53]
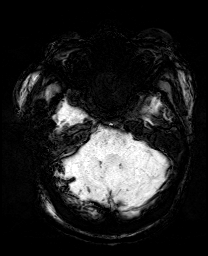
[im 35/53]
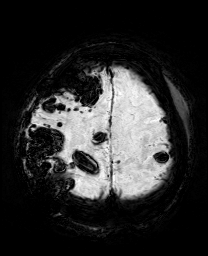
[im 53/53]
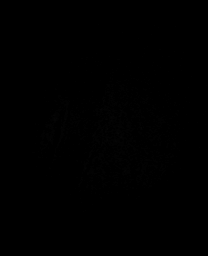

[Series 17: T2 · coronal · 5.0mm · 0.34mm/px · 2 of 29 slices shown (2 of 2)]
[im 1/29]
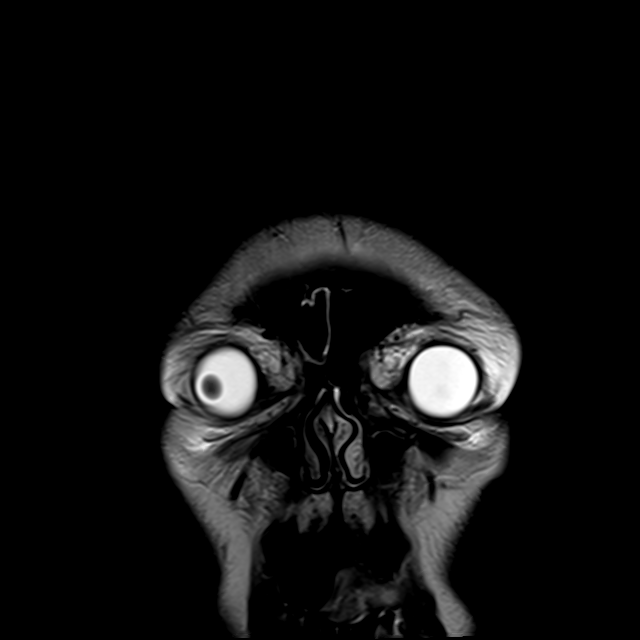
[im 29/29]
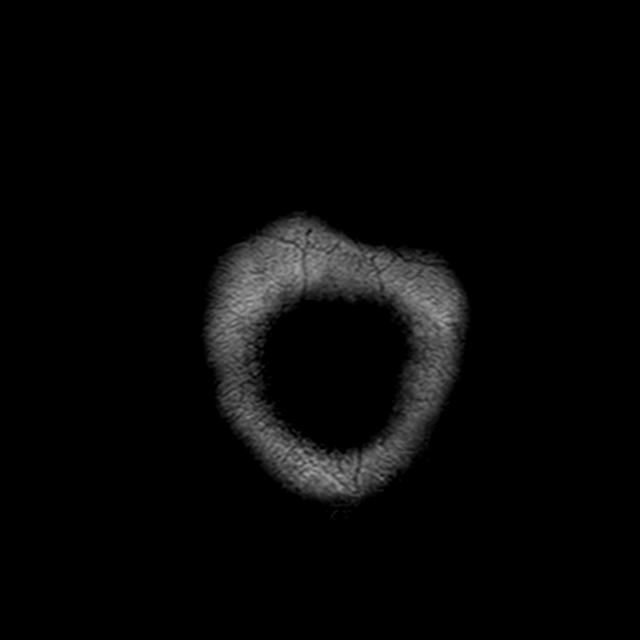

[44 of 48 positions shown; findings below may reference images not displayed]

FINDINGS: Brain: Extensive acute infarction throughout the right cerebral
convexity, including right MCA and ACA territories.There is
exuberant associated cytotoxic edema with areas of hemorrhagic
transformation. The extent of hemorrhage and edema may be mildly
progressed from the prior CT from [DATE], although comparison
is difficult across modalities. Prior right craniectomy with brain
herniating through the defect. Slight paradoxical rightward midline
shift as result (2 mm at the foramen of Monroe).

Additional smaller infarcts in the left frontal parietal white
matter and left occipital cortex.

Left parietal acute hemorrhage is likely similar.

No hydrocephalus.

Mildly prominent retro cerebellar CSF, likely chronic.

Vascular: Major arterial flow voids at the skull base are
maintained. Please see prior CTA for further arterial evaluation.
Additionally, please see recent CT of the and v for evaluation of
the dural venous sinuses.

Skull and upper cervical spine: Normal marrow signal.

Sinuses/Orbits: Mild mucosal thickening. No acute orbital
abnormality.

Other: Large right mastoid effusion.
IMPRESSION: 1. Extensive acute infarction throughout the right cerebral
convexity, including right MCA and ACA territories. Exuberant
associated edema with areas of hemorrhagic transformation. The
extent of hemorrhage and edema may be mildly progressed from the
modalities. Repeat CT could along more direct comparison if
clinically indicated.
2. Prior right craniectomy with brain herniating through the defect.
Slight paradoxical rightward midline shift as result (2 mm at the
foramen of Monroe).
3. Additional smaller infarcts in the left frontal parietal white
matter and left occipital cortex.
4. Left parietal acute hemorrhage is likely similar.
5. Large right mastoid effusion.
6. Please see recent CTA and CTV for vascular findings.

## 2020-08-11 MED ORDER — MAGNESIUM SULFATE 2 GM/50ML IV SOLN
2.0000 g | Freq: Once | INTRAVENOUS | Status: AC
  Administered 2020-08-11: 2 g via INTRAVENOUS
  Filled 2020-08-11: qty 50

## 2020-08-11 MED ORDER — POTASSIUM CHLORIDE 20 MEQ PO PACK: 20.0000 meq | PACK | ORAL | Status: DC

## 2020-08-11 MED ORDER — FENTANYL CITRATE (PF) 100 MCG/2ML IJ SOLN
50.0000 ug | INTRAMUSCULAR | Status: DC | PRN
  Administered 2020-08-11 – 2020-08-13 (×6): 50 ug via INTRAVENOUS
  Filled 2020-08-11 (×5): qty 2

## 2020-08-11 MED ORDER — POTASSIUM CHLORIDE 10 MEQ/100ML IV SOLN
10.0000 meq | INTRAVENOUS | Status: AC
  Administered 2020-08-11 (×4): 10 meq via INTRAVENOUS
  Filled 2020-08-11 (×4): qty 100

## 2020-08-11 MED ORDER — HEPARIN (PORCINE) 25000 UT/250ML-% IV SOLN
1200.0000 [IU]/h | INTRAVENOUS | Status: DC
  Administered 2020-08-11: 800 [IU]/h via INTRAVENOUS
  Administered 2020-08-12: 1300 [IU]/h via INTRAVENOUS
  Administered 2020-08-14: 1200 [IU]/h via INTRAVENOUS
  Filled 2020-08-11 (×4): qty 250

## 2020-08-11 MED ORDER — SODIUM CHLORIDE 0.9 % IV SOLN: INTRAVENOUS | Status: DC

## 2020-08-11 MED ORDER — POTASSIUM CHLORIDE 20 MEQ PO PACK
20.0000 meq | PACK | ORAL | Status: AC
  Administered 2020-08-11 (×2): 20 meq
  Filled 2020-08-11 (×2): qty 1

## 2020-08-11 MED ORDER — POTASSIUM & SODIUM PHOSPHATES 280-160-250 MG PO PACK
1.0000 | PACK | Freq: Three times a day (TID) | ORAL | Status: AC
  Administered 2020-08-11 (×3): 1
  Filled 2020-08-11 (×3): qty 1

## 2020-08-11 MED ORDER — SODIUM CHLORIDE 3 % IV SOLN
INTRAVENOUS | Status: DC
  Filled 2020-08-11: qty 500

## 2020-08-11 NOTE — Progress Notes (Signed)
STROKE TEAM PROGRESS NOTE   INTERVAL HISTORY RN at the bedside. Pt still intubated, on vent. Off fentanyl. Not responsive, right side head bone flap tense. MRI pending. Hb 7.7, seems stable, if MRI no further bleeding, will consider heparin IV. Pt has no po access at this time, will need cortrak placement. K 3.0, on supplement.   Vitals:   08/11/20 0600 08/11/20 0700 08/11/20 0800 08/11/20 0820  BP: 129/76 130/64 (!) 143/86   Pulse: 68 64 78   Resp: 19 20 20    Temp:   97.7 F (36.5 C)   TempSrc:   Axillary   SpO2: 100% 100% 100% 100%  Weight:      Height:       CBC:  Recent Labs  Lab 08/10/20 0619 08/10/20 1632 08/11/20 0408 08/11/20 0425 08/11/20 0652  WBC 21.3*  --  17.4*  --   --   HGB 6.2*   < > 7.4* 6.8* 7.7*  HCT 23.8*   < > 26.8* 20.0* 27.3*  MCV 63.1*  --  67.0*  --   --   PLT 400  --  286  --   --    < > = values in this interval not displayed.   Basic Metabolic Panel:  Recent Labs  Lab 08/08/20 0253 08/08/20 0939 08/10/20 0619 08/10/20 1031 08/10/20 1632 08/11/20 0408 08/11/20 0425  NA 143   < > 160*  --    < > 155* 158*  K 3.6   < > 2.6*  --   --  3.0* 3.0*  CL 113*   < > >130*  --   --  126*  --   CO2 20*   < > 23  --   --  21*  --   GLUCOSE 194*   < > 125*  --   --  175*  --   BUN 10   < > 9  --   --  10  --   CREATININE 0.53   < > 0.70  --   --  0.73  --   CALCIUM 8.2*   < > 8.0*  --   --  8.0*  --   MG 2.0  --   --  1.8  --   --   --   PHOS 1.9*  --   --   --   --   --   --    < > = values in this interval not displayed.   Lipid Panel:  Recent Labs  Lab 08/08/2020 0755  CHOL 276*  TRIG 168*  HDL 45  CHOLHDL 6.1  VLDL 34  LDLCALC 08/02/2020*   HgbA1c:  Recent Labs  Lab 07/31/2020 0755  HGBA1C 12.9*   Urine Drug Screen:  Recent Labs  Lab 07/26/2020 1258  LABOPIA NONE DETECTED  COCAINSCRNUR NONE DETECTED  LABBENZ NONE DETECTED  AMPHETMU NONE DETECTED  THCU NONE DETECTED  LABBARB NONE DETECTED    Alcohol Level  Recent Labs  Lab  08/12/2020 0424  ETH <10    IMAGING  CT ABDOMEN PELVIS WO CONTRAST Result Date: 08/10/2020 IMPRESSION: No acute intra-abdominal pathology identified.   DG Chest 2 View Result Date: 07/28/2020 MPRESSION: No active cardiopulmonary disease.   CT Head Wo Contrast Result Date: 07/31/2020 IMPRESSION: Large posterior right MCA territory infarct involving the posterior right temporal cortex with superimposed moderate subarachnoid hemorrhage interdigitating within the sulci of the infarcted region and tiny subdural hematoma. Mild mass effect. No midline shift.  MR ANGIO HEAD WO CONTRAST Result Date: 08/04/2020 IMPRESSION:  1. Significantly degraded by motion.  2. Narrowed/irregularity right M2 branches correlating with the acute infarct. No detectable arterial disease elsewhere.   MR BRAIN WO CONTRAST Result Date: 07/20/2020 CT. IMPRESSION:  1. Acute infarct involving the inferior division right MCA territory with petechial hemorrhage and 4 mm midline shift.  2. Diffusion only study due to patient condition.    PHYSICAL EXAM  Temp:  [97.7 F (36.5 C)-101.1 F (38.4 C)] 97.7 F (36.5 C) (05/30 0800) Pulse Rate:  [62-117] 78 (05/30 0800) Resp:  [16-24] 20 (05/30 0800) BP: (121-143)/(61-86) 143/86 (05/30 0800) SpO2:  [99 %-100 %] 100 % (05/30 0820) FiO2 (%):  [30 %-40 %] 40 % (05/30 0820) Weight:  [80.3 kg] 80.3 kg (05/30 0423)  General - Well nourished, well developed, intubated off fentanyl.  Ophthalmologic - fundi not visualized due to noncooperation.  Cardiovascular - Regular rhythm and rate.  Neuro - intubated off fentanyl, eyes closed, eyelids swollen, not open eyes on stimulation, not following commands. With forced eye opening, eyes in mild outward position, not blinking to visual threat, doll's eyes sluggish, not tracking, pupil equal size, sluggish to light. Corneal reflex weakly present on the right, absent on the left, gag and cough strongly present. Breathing over the  vent.  Facial symmetry not able to test due to ET tube.  Tongue protrusion not cooperative. On pain and gag cough stimulation, mild withdraw in BLEs and RUE, extensor posturing on the LUE. DTR diminished and no babinski. Sensation, coordination and gait not tested.   ASSESSMENT/PLAN Ms. Freyja Govea is a 40 y.o. female with history of gestational diabetes, kidney stone  presenting with nausea, vomiting and R temple headache.  CT head demonstrated a right posterior MCA stroke with moderate subarachnoid hemorrhage interdigitating within the sulci of the infarcted region and tiny SDH with mild mass effect.   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   MRA Narrowed/irregularity right M2 branches correlating with the acute infarct.  CTA head and neck 5/29 - Very attenuated right MCA branches similar to prior MRA with high-grade right M1 segment narrowing and no enhancing vessels at the level of initially visualized infarct.  MRI pending today  Carotid Doppler unremarkable  2D Echo EF 60-65%   SARs coronavirus negative  Hypercoag work up pending  TCD bubble study no PFO  LDL 197  HgbA1c 12.9  VTE prophylaxis - SCDs  No antithrombotic/anticoagulant prior to admission, was on no antithrombotics due to hemorrhagic conversion and severe anemia needing PRBC. May consider heparin IV if MRI stable  Therapy recommendations:  pending  Disposition:  Pending  Cerebral venous sinus thrombosis  ? Post procedure ? Procedure related  CT head 5/29 multifocal ICH with right parietal, right frontal and left frontal  CTV 5/29 venous sinus thrombosis SSS and right transver-sigmoid drainage  discussed with NSG Dr. Valeta Harms - OK for anticoagulation  Discussed with Dr. Sherlon Handing -  poor candidate for further intervention given current condition and needs anticoagulation even with procedure  Will consider to start heparin IV MRI stable   Cerebral Edema Uncal herniation s/p [facility]  CT repeat showed MLS worsened to 8-77mm  Exam concerning for early uncal herniation on the right  S/p [facility] with Dr. Venetia Maxon (NSG)  CT  repeat 5/29 - resolved MLS  On HTS at 75cc/hr -> 1/2NS @ 50  Na 135->136->140->143->149->154->156->160->155  MRI repeat pending  Questionable seizure  EEG potential seizure focus on the right  On Keppra 500mg  bid  Seizure precautions  Diabetes, uncontrolled   Home meds:  Metformin 500mg  daily  HgbA1c 12.9, goal < 7.0  CBGs  SSI  Diabetes coordinator consult  Hypertension  Home meds:  none  Stable . SBP goal < 160 due to hemorrhagic conversion . Long-term BP goal normotensive  Hyperlipidemia  Home meds:  none  LDL 197 goal < 70  On lipitor 80  Continue statin on discharge  Leukocytosis Fever   WBC 20->17.5->16.4->21.3->17.4  Tmax 99.9->100->101.2->100.6  UA neg  CXR neg  Blood culture pending  Severe anemia with acute blood loss  Baseline anemia   This admission, Hb 8.5->7.5->6.8->6.2->PRBC->7.7  S/p PRBC x 1  Consider heparin IV if Hb stable  Other Stroke Risk Factors    Other Active Problems  Thrombocytosis platelet 715->524->400->286  Hospital day #4  This patient is critically ill due to right large MCA infarct with hemorrhagic conversion, cerebral venous sinus thrombosis, cerebral edema, s/p hemicraniectomy, fever, leukocytosis, severe anemia and at significant risk of neurological worsening, death form brain herniation, recurrent stroke or ICH, sepsis, severe anemia, seizure. This patient's care requires constant monitoring of vital signs, hemodynamics, respiratory and cardiac monitoring, review of multiple databases, neurological assessment, discussion with family, other specialists and medical  decision making of high complexity. I spent 45 minutes of neurocritical care time in the care of this patient.   , MD PhD Stroke Neurology 08/11/2020 9:02 AM   To contact Stroke Continuity provider, please refer to Marvel Plan. After hours, contact General Neurology

## 2020-08-11 NOTE — Progress Notes (Signed)
Dr. Otelia Limes made aware of 2000 sodium of 151 and goal of 150-155. New verbal orders to stop normal saline, and to restart 3% hypertonic saline at 50 mL/hour. Will continue to monitor.

## 2020-08-11 NOTE — Progress Notes (Signed)
Patient transported to MRI and back without complications. RN at bedside. 

## 2020-08-11 NOTE — Progress Notes (Signed)
OT Cancellation Note  Patient Details Name: Regina Morris MRN: [id number] DOB: 1980/05/12   Cancelled Treatment:    Reason Eval/Treat Not Completed: Patient not medically ready (uncontrolled BP pending MRI results intubated Rn request to hold)  Wynona Neat, OTR/L  Acute Rehabilitation Services Pager: [phone] Office: [phone] .  08/11/2020, 2:02 PM

## 2020-08-11 NOTE — Plan of Care (Signed)
  Problem: Clinical Measurements: Goal: Respiratory complications will improve Outcome: Progressing Goal: Cardiovascular complication will be avoided Outcome: Progressing   Problem: Nutrition: Goal: Adequate nutrition will be maintained Outcome: Progressing   Problem: Elimination: Goal: Will not experience complications related to urinary retention Outcome: Progressing   

## 2020-08-11 NOTE — Consult Note (Signed)
Consultation Note Date: 08/11/2020   Patient Name: Regina Morris  DOB: 1980-11-26  MRN: [id number]  Age / Sex: 40 y.o., female  PCP: Patient, No Pcp Per (Inactive) Referring Physician: Stroke, Md, MD  Reason for Consultation: Establishing goals of care and Psychosocial/spiritual support  HPI/Patient Profile: 40 y.o. female  admitted on 07/29/2020 with   with headache nausea and vomiting to [facility] on 5/26. 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  Transferred to [facility]. Started on keppra. Mental status worsened 5/27 evening and the morning of 5/28 CT head showed worsening midline shift.  She was taken to the OR for decompressive hemicraniotomy She had rsection of non-viable brain tissue during hemicraniotomy and a bone flap was placed when she was closed.   Today is day 4 of this hospitalization.  She remains intubated.  Prognosis is poor  Family face treatment option decisions, advanced directive decisions and anticipatory care needs.    Clinical Assessment and Goals of Care:   This NP Wadie Lessen reviewed medical records, received report from team, assessed the patient and then spoke to the patient's significant other of many years/ Regina Morris by telephone with the assistance of interpreter/Graziella to discuss diagnosis, prognosis, GOC, EOL wishes disposition and options.   Concept of Palliative Care was introduced as specialized medical care for people and their families living with serious illness.  If focuses on providing relief from the symptoms and stress of a serious illness.  The goal is to improve quality of life for both the patient and the family.   Values and goals of care important to patient and family were attempted to be elicited.  Created space and opportunity for  patient  and family to explore thoughts and feelings regarding current medical situation.  Mr. Regina Morris an understanding of the seriousness of the situation however I worry that he understands the long term prognosis, or the limitations of medical interventions to prolong quality of life.      Questions and concerns addressed.  Patient  encouraged to call with questions or concerns.     PMT will continue to support holistically.   Family meeting is planned tomorrow at 1 pm with patient's significant other daughter, friend, Regina Morris/interpreter and Dr. Erlinda Hong    Patient does not have any documented H POA or advanced care planning documents.  It is my understanding that Regina Morris is her significant other of 20+ years and the father of their 61 year old daughter.  Education offered on the importance of continued conversation with family members, medical providers regarding current medical situation and viable treatment options ensuring that decisions are made within the goals of care in best interest of the patient.    SUMMARY OF RECOMMENDATIONS    Code Status/Advance Care Planning:  Full code   Additional Recommendations (Limitations, Scope, Preferences):  Full Scope Treatment  Psycho-social/Spiritual:   Desire for further Chaplaincy support:yes   Prognosis:   Unable to determine  Discharge Planning: To Be Determined      Primary Diagnoses: Present on Admission: . Acute right MCA stroke ([name])   I have reviewed the medical record, interviewed the patient and family, and examined the patient. The following aspects are pertinent.  Past Medical History:  Diagnosis Date  . Gestational diabetes   . Kidney stone    Social History   Socioeconomic History  . Marital status: Single  Spouse name: Not on file  . Number of children: Not on file  . Years of education: Not on file  . Highest education level: Not on file  Occupational History  . Not on file   Tobacco Use  . Smoking status: Never Smoker  . Smokeless tobacco: Never Used  Vaping Use  . Vaping Use: Never used  Substance and Sexual Activity  . Alcohol use: No  . Drug use: Never  . Sexual activity: Not on file  Other Topics Concern  . Not on file  Social History Narrative  . Not on file   Social Determinants of Health   Financial Resource Strain: Not on file  Food Insecurity: Not on file  Transportation Needs: Not on file  Physical Activity: Not on file  Stress: Not on file  Social Connections: Not on file   Family History  Problem Relation Age of Onset  . Uterine cancer Mother   . Diabetes Maternal Grandfather    Scheduled Meds: .  stroke: mapping our early stages of recovery book   Does not apply Once  . atorvastatin  80 mg Per Tube Q supper  . chlorhexidine gluconate (MEDLINE KIT)  15 mL Mouth Rinse BID  . Chlorhexidine Gluconate Cloth  6 each Topical Daily  . feeding supplement (PROSource TF)  45 mL Per Tube Daily  . insulin aspart  0-15 Units Subcutaneous Q4H  . mouth rinse  15 mL Mouth Rinse 10 times per day  . pantoprazole sodium  40 mg Per Tube Daily  . polyethylene glycol  17 g Per Tube Daily  . potassium chloride  20 mEq Per Tube Q4H  . senna-docusate  1 tablet Per Tube BID   Continuous Infusions: . sodium chloride 50 mL/hr at 08/11/20 0900  . feeding supplement (JEVITY 1.5 CAL/FIBER) 1,000 mL (08/08/20 1747)  . levETIRAcetam Stopped (08/11/20 0451)  . potassium chloride 10 mEq (08/11/20 1026)   PRN Meds:.acetaminophen **OR** acetaminophen (TYLENOL) oral liquid 160 mg/5 mL **OR** acetaminophen, fentaNYL (SUBLIMAZE) injection, hydrALAZINE, labetalol Medications Prior to Admission:  Prior to Admission medications   Medication Sig Start Date End Date Taking? Authorizing Provider  ciprofloxacin (CIPRO) 500 MG tablet TOMAR UNA TABLETA POR BOCA DOS VECES AL DIA POR 7 DIAS 12/14/19 12/13/20  Lynn Ito, MD  ferrous sulfate 325 (65 FE) MG tablet Take 1  tablet (325 mg total) by mouth daily with breakfast. 12/16/19 01/15/20  Lynn Ito, MD  metFORMIN (GLUCOPHAGE) 500 MG tablet Take 1 tablet (500 mg total) by mouth daily with breakfast. 12/15/19 01/14/20  Lynn Ito, MD   Allergies  Allergen Reactions  . Penicillins Rash   Review of Systems  Physical Exam  Vital Signs: BP (!) 186/70 (BP Location: Right Leg)   Pulse 69   Temp 97.7 F (36.5 C) (Axillary)   Resp 19   Ht 5\' 6"  (1.676 m)   Wt 80.3 kg   LMP 08/05/2020 (Exact Date)   SpO2 100%   BMI 28.57 kg/m  Pain Scale: CPOT   Pain Score: 0-No pain   SpO2: SpO2: 100 % O2 Device:SpO2: 100 % O2 Flow Rate: .   IO: Intake/output summary:   Intake/Output Summary (Last 24 hours) at 08/11/2020 1107 Last data filed at 08/11/2020 0900 Gross per 24 hour  Intake 2436.18 ml  Output 1125 ml  Net 1311.18 ml    LBM: Last BM Date:  (pta) Baseline Weight: Weight: 74.8 kg Most recent weight: Weight: 80.3 kg     Palliative Assessment/Data:  Discussed with Dr Erlinda Hong  Time In: 25 Time Out: 1140 Time Total: 70 minutes Greater than 50%  of this time was spent counseling and coordinating care related to the above assessment and plan.  Signed by: Wadie Lessen, NP   Please contact Palliative Medicine Team phone at [phone] for questions and concerns.  For individual provider: See Shea Evans

## 2020-08-11 NOTE — Progress Notes (Signed)
ANTICOAGULATION CONSULT NOTE - Initial Consult  Pharmacy Consult:  Heparin Indication:  Cerebral venous sinus thrombosis  Allergies  Allergen Reactions  . Penicillins Rash    Patient Measurements: Height: 5\' 6"  (167.6 cm) Weight: 80.3 kg (177 lb 0.5 oz) IBW/kg (Calculated) : 59.3 Heparin Dosing Weight: 76 kg  Vital Signs: Temp: 97.7 F (36.5 C) (05/30 0800) Temp Source: Axillary (05/30 0800) BP: 143/86 (05/30 0800) Pulse Rate: 78 (05/30 0800)  Labs: Recent Labs    07/17/2020 0250 07/28/2020 1533 08/10/20 0619 08/10/20 1632 08/11/20 0408 08/11/20 0425 08/11/20 0652  HGB 8.6*   < > 6.2*   < > 7.4* 6.8* 7.7*  HCT 32.2*   < > 23.8*   < > 26.8* 20.0* 27.3*  PLT 524*  --  400  --  286  --   --   CREATININE 0.51  --  0.70  --  0.73  --   --    < > = values in this interval not displayed.    Estimated Creatinine Clearance: 101.9 mL/min (by C-G formula based on SCr of 0.73 mg/dL).   Medical History: Past Medical History:  Diagnosis Date  . Gestational diabetes   . Kidney stone     Assessment: 19 YOF presented with nausea, vomiting and right temple headache, found to have hemorrhagic R MCA infarct with midline shift and mass effect.  Most recent CT on 5/29 showed progressive edema with newly seen subcortical hemorrhage in the left frontal region.  Subsequent venogram positive for dural venous sinus thrombosis.  Pharmacy consulted to dose IV heparin.  Hemoglobin/hematocrit low normal post transfusion, platelet count WNL. MRI result pending, but okay to start heparin per MD.  Goal of Therapy:  Heparin level 0.3-0.5 units/ml Monitor platelets by anticoagulation protocol: Yes   Plan:  Heparin gtt at 800 units/hr - no bolus Check 6 hr heparin level Daily heparin level and CBC  Rayven Hendrickson D. 6/29, PharmD, BCPS, BCCCP 08/11/2020, 1:45 PM

## 2020-08-11 NOTE — Procedures (Signed)
Patient Name: Regina Morris  MRN: [id number]  Epilepsy Attending: Charlsie Quest  Referring Physician/Provider: Dr Marvel Plan Date: 08/11/2020 Duration: 24.17 mins  Patient history: 40 year old female with right MCA infarct with hemorrhagic transformation with worsening mental status.  EEG to evaluate for seizures.  Level of alertness:  Comatose  AEDs during EEG study: Keppra  Technical aspects: This EEG study was done with scalp electrodes positioned according to the 10-20 International system of electrode placement. Electrical activity was acquired at a sampling rate of 500Hz  and reviewed with a high frequency filter of 70Hz  and a low frequency filter of 1Hz . EEG data were recorded continuously and digitally stored.   Description: EEG showed continuous generalized 3 to 5 Hz theta-delta slowing. Run of sharp waves was noted in right frontocentral region without definite evolution suggestive of brief ictal-interictal rhythmic discharges.  Hyperventilation and photic stimulation were not performed.     ABNORMALITY -Brief ictal-interictal rhythmic discharges, right frontocentral region -Continuous slow, generalized  IMPRESSION: This study showed evidence of epileptogenicity arising from right frontocentral region with high potential for seizures.  Additionally there is severe diffuse encephalopathy, nonspecific etiology. No definite seizures  were seen throughout the recording.  Rendy Lazard 

## 2020-08-11 NOTE — Progress Notes (Signed)
NAME:  Suleika Donavan, MRN:  [id number], DOB:  17-Jun-1980, LOS: 4 ADMISSION DATE:  August 21, 2020, CONSULTATION DATE:  08/11/2020 REFERRING MD:  Stroke, Md, MD, CHIEF COMPLAINT:  Respiratory failure, tsroke  History of Present Illness:  Kindred Heying is a 40 y.o. woman who presented with headache nausea and vomiting to [facility] on 5/26. 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 Transferred to [facility]. Started on keppra. Mental status worsened 5/27 evening and the morning of 5/28 CT head showed worsening midline shift. She was taken to the OR for decompressive hemicraniotomy with a pupillar exam concerning for impending herniation. She had rsection of non-viable brain tissue during hemicraniotomy and a bone flap was placed when she was closed.   History is obtained from chart review as the patient is intubated and not able to provide response.   Pertinent  Medical History  Gestational diabetes Nephrolithiasis  Significant Hospital Events: Including procedures, antibiotic start and stop dates in addition to other pertinent events   . 5/26 Presented to [facility] with headache, nausea, vomiting . 5/26 transferred to [facility], started on keppra and HTS . 5/27 possible neurologic decline, repeat CT head shows stability of bleeding, but increase in edema . 5/27 EEG epileptogenecity in the right frontal region and cortical dysfunction of the right hemisphere, no seizures . 5/27 cortrak placed . 5/28 overnight worsening midline shift and change in neuro exam, plan for decompressive hemicraniotomy  Interim History / Subjective:  No change in status.  Objective   Blood pressure (!) 163/75, pulse 68, temperature (!) 100.6 F (38.1 C), temperature source Axillary, resp. rate (!) 27, height 5\' 6"  (1.676 m), weight 80.3 kg, last menstrual period 08/05/2020, SpO2 100 %.    Vent Mode:  PRVC FiO2 (%):  [30 %-40 %] 40 % Set Rate:  [16 bmp] 16 bmp Vt Set:  [350 mL-470 mL] 350 mL PEEP:  [5 cmH20] 5 cmH20 Pressure Support:  [12 cmH20] 12 cmH20 Plateau Pressure:  [12 cmH20] 12 cmH20   Intake/Output Summary (Last 24 hours) at 08/11/2020 1350 Last data filed at 08/11/2020 1100 Gross per 24 hour  Intake 2108.06 ml  Output 575 ml  Net 1533.06 ml   Filed Weights   07/28/2020 0500 07/17/2020 1216 08/11/20 0423  Weight: 71 kg 71 kg 80.3 kg    Examination: General: intubated, sedated, overweight. HENT: right hemicraniotomy, flap is tense.++  Facial edema. Lungs: ctab no wheezes or crackles Cardiovascular: tachycardic, regular Abdomen: soft, nontender Extremities: no edema Neuro: No response to voice.  Semipurposeful in the right upper extremity extends to the left upper and bilateral flexor responses.  Labs/imaging that I havepersonally reviewed  (right click and "Reselect all SmartList Selections" daily)  CT head personally reviewed from today right MCA infarct with hemorrhage with midline shift CT venogram with dural venous sinus thrombus  Resolved Hospital Problem list     Assessment & Plan:   Acute right MCA territory hemorrhage with edema, midline shift Right decompressive hemicraniotomy with bone flap Acute hypoxemic respiratory failure Dural venous sinus thrombus Acute blood loss anemia  Plan:  -Repeat MRI today.  Resume anticoagulation for dural venous thrombosis. -Plan for tracheostomy early this week. -Prognosis is poor.  Palliative care consultation to assist with goals of care discussion.  Best practice (right click and "Reselect all SmartList Selections" daily)  Diet:  Tube Feed  Pain/Anxiety/Delirium protocol (  if indicated): Yes (RASS goal -1) VAP protocol (if indicated): Yes DVT prophylaxis: Contraindicated GI prophylaxis: H2B Glucose control:  SSI Yes Central venous access:  N/A Arterial line:  Yes, and it is still needed Foley:  Yes, and it  is still needed Mobility:  bed rest  PT consulted: N/A Last date of multidisciplinary goals of care discussion [updated husband and daughter at bedside 5/28] Code Status:  full code Disposition: ICU  Labs   CBC: Recent Labs  Lab 08/06/20 2048 08/08/20 0253 08/08/2020 0250 07/13/2020 1533 08/10/20 1601 08/10/20 1632 08/11/20 0408 08/11/20 0425 08/11/20 0652  WBC 20.5* 17.6* 16.4*  --  21.3*  --  17.4*  --   --   HGB 9.3* 8.5* 8.6*   < > 6.2* 8.0* 7.4* 6.8* 7.7*  HCT 34.2* 32.5* 32.2*   < > 23.8* 28.7* 26.8* 20.0* 27.3*  MCV 60.4* 61.7* 62.0*  --  63.1*  --  67.0*  --   --   PLT 715* 549* 524*  --  400  --  286  --   --    < > = values in this interval not displayed.    Basic Metabolic Panel: Recent Labs  Lab 08/06/20 2048 07/28/2020 1520 08/08/20 0253 08/08/20 0939 07/14/2020 0250 08/04/2020 0914 08/04/2020 1533 07/16/2020 1653 07/26/2020 1654 07/27/2020 2114 08/10/20 0619 08/10/20 1031 08/10/20 1632 08/10/20 2129 08/11/20 0408 08/11/20 0425 08/11/20 0856  NA 135   < > 143   < > 154*   < > 157*   < > 157*   < > 160*  --  158* 157* 155* 158*  --   K 3.7  --  3.6  --  4.2  --  3.5  --  3.4*  --  2.6*  --   --   --  3.0* 3.0*  --   CL 100  --  113*  --  119*  --   --   --   --   --  >130*  --   --   --  126*  --   --   CO2 20*  --  20*  --  21*  --   --   --   --   --  23  --   --   --  21*  --   --   GLUCOSE 290*  --  194*  --  182*  --   --   --   --   --  125*  --   --   --  175*  --   --   BUN 9  --  10  --  5*  --   --   --   --   --  9  --   --   --  10  --   --   CREATININE 0.61  --  0.53  --  0.51  --   --   --   --   --  0.70  --   --   --  0.73  --   --   CALCIUM 8.6*  --  8.2*  --  8.9  --   --   --   --   --  8.0*  --   --   --  8.0*  --   --   MG  --   --  2.0  --   --   --   --   --   --   --   --  1.8  --   --   --   --   --   PHOS  --   --  1.9*  --   --   --   --   --   --   --   --   --   --   --   --   --  2.2*   < > = values in this interval not displayed.    GFR: Estimated Creatinine Clearance: 101.9 mL/min (by C-G formula based on SCr of 0.73 mg/dL). Recent Labs  Lab 08/06/20 2048 08/08/20 0253 08-Sep-2020 0250 08/10/20 0619 08/11/20 0408  PROCALCITON <0.10  --   --   --   --   WBC 20.5* 17.6* 16.4* 21.3* 17.4*    Liver Function Tests: Recent Labs  Lab 08/06/20 2048  AST 16  ALT 13  ALKPHOS 89  BILITOT 1.1  PROT 7.7  ALBUMIN 3.5   Recent Labs  Lab 08/06/20 2048  LIPASE 21   No results for input(s): AMMONIA in the last 168 hours.  ABG    Component Value Date/Time   PHART 7.409 08/11/2020 0425   PCO2ART 38.0 08/11/2020 0425   PO2ART 169 (H) 08/11/2020 0425   HCO3 23.9 08/11/2020 0425   TCO2 25 08/11/2020 0425   ACIDBASEDEF 1.0 08/11/2020 0425   O2SAT 100.0 08/11/2020 0425     Coagulation Profile: Recent Labs  Lab 08/03/2020 0410  INR 1.1    Cardiac Enzymes: No results for input(s): CKTOTAL, CKMB, CKMBINDEX, TROPONINI in the last 168 hours.  HbA1C: Hgb A1c MFr Bld  Date/Time Value Ref Range Status  08/04/2020 07:55 AM 12.9 (H) 4.8 - 5.6 % Final    Comment:    (NOTE)         Prediabetes: 5.7 - 6.4         Diabetes: >6.4         Glycemic control for adults with diabetes: <7.0   12/12/2019 09:57 PM 11.7 (H) 4.8 - 5.6 % Final    Comment:    (NOTE) Pre diabetes:          5.7%-6.4%  Diabetes:              >6.4%  Glycemic control for   <7.0% adults with diabetes    CRITICAL CARE Performed by: Lynnell Catalan   Total critical care time: 40 minutes  Critical care time was exclusive of separately billable procedures and treating other patients.  Critical care was necessary to treat or prevent imminent or life-threatening deterioration.  Critical care was time spent personally by me on the following activities: development of treatment plan with patient and/or surrogate as well as nursing, discussions with consultants, evaluation of patient's response to treatment, examination of patient, obtaining  history from patient or surrogate, ordering and performing treatments and interventions, ordering and review of laboratory studies, ordering and review of radiographic studies, pulse oximetry, re-evaluation of patient's condition and participation in multidisciplinary rounds.  Lynnell Catalan, MD [facility] ICU Physician [facility] [place] [facility]  Pager: [phone] Mobile: [phone] After hours: [phone].

## 2020-08-11 NOTE — Progress Notes (Signed)
ANTICOAGULATION CONSULT NOTE  Pharmacy Consult:  Heparin Indication:  Cerebral venous sinus thrombosis  Allergies  Allergen Reactions  . Penicillins Rash    Patient Measurements: Height: 5\' 6"  (167.6 cm) Weight: 80.3 kg (177 lb 0.5 oz) IBW/kg (Calculated) : 59.3 Heparin Dosing Weight: 76 kg  Vital Signs: Temp: 98.3 F (36.8 C) (05/30 2000) Temp Source: Axillary (05/30 2000) BP: 145/74 (05/30 2014) Pulse Rate: 88 (05/30 2014)  Labs: Recent Labs    07/20/2020 0250 07/23/2020 1533 08/10/20 08/12/20 08/10/20 1632 08/11/20 0408 08/11/20 0425 08/11/20 0652 08/11/20 1956  HGB 8.6*   < > 6.2*   < > 7.4* 6.8* 7.7*  --   HCT 32.2*   < > 23.8*   < > 26.8* 20.0* 27.3*  --   PLT 524*  --  400  --  286  --   --   --   HEPARINUNFRC  --   --   --   --   --   --   --  <0.10*  CREATININE 0.51  --  0.70  --  0.73  --   --   --    < > = values in this interval not displayed.    Estimated Creatinine Clearance: 101.9 mL/min (by C-G formula based on SCr of 0.73 mg/dL).   Medical History: Past Medical History:  Diagnosis Date  . Gestational diabetes   . Kidney stone     Assessment: 34 YOF presented with nausea, vomiting and right temple headache, found to have hemorrhagic R MCA infarct with midline shift and mass effect.  Most recent CT on 5/29 showed progressive edema with newly seen subcortical hemorrhage in the left frontal region.  Subsequent venogram positive for dural venous sinus thrombosis.  Pharmacy consulted to dose IV heparin.  Heparin level undetectable on 800 units/hr. No overt bleeding noted. Following MRI's closely for hemorrhagic conversion worsening. Hgb low 7.7 this morning. Plt within normal limits.   Goal of Therapy:  Heparin level 0.3-0.5 units/ml Monitor platelets by anticoagulation protocol: Yes   Plan:  Increase Heparin infusion to 1000units/hr - no bolus Check 6 hr heparin level Daily heparin level and CBC  6/29 PharmD., BCPS Clinical  Pharmacist 08/11/2020 8:53 PM

## 2020-08-11 NOTE — Progress Notes (Signed)
EEG attempted, Pt not in the Room

## 2020-08-11 NOTE — Progress Notes (Signed)
approx 200 mL of fentanyl wasted from back with RN Scarlette Slice in trash

## 2020-08-11 NOTE — Progress Notes (Signed)
eLink Physician-Brief Progress Note Patient Name: Regina Morris DOB: 04-Oct-1980 MRN: [id number]   Date of Service  08/11/2020  HPI/Events of Note  RN letting us know that K+ 3.0, on vent with PIV/OG,   Hgb 7.4 on CBC and 6.8 off ABG's  eICU Interventions  - Potassium replacement protocol ordered, Cr normal. Repeat Hg/hct, if < 7, need PRBC.      Intervention Category Intermediate Interventions: Electrolyte abnormality - evaluation and management  Ranee Gosselin 08/11/2020, 6:38 AM

## 2020-08-11 NOTE — Progress Notes (Signed)
Pt remains on weaning mode (PS?CPAP) d/t intolerance of FS mode (PRVC).  Pt VS stable and volumes appropriate.  Vent alarms tightened to prevent low RR or VT.

## 2020-08-11 NOTE — Progress Notes (Signed)
PT Cancellation Note  Patient Details Name: Regina Morris MRN: [id number] DOB: 11-24-1980   Cancelled Treatment:    Reason Eval/Treat Not Completed: Medical issues which prohibited therapy at this time. After discussion with RN, pt remains intubated and is working to stabilize BP at this time. RN asking therapies to hold for today, will continue to follow and progress as appropriate.   Rolm Baptise, PT, DPT   Acute Rehabilitation Department Pager #: [phone]  Gaetana Michaelis 08/11/2020, 1:14 PM

## 2020-08-11 NOTE — Progress Notes (Signed)
E-Link updated about patient's potassium of 3.0, and hemoglobin of 6.8 by I-Stat ABG drawn by RT, hemoglobin of 7.4 on CBC.

## 2020-08-11 NOTE — Progress Notes (Signed)
EEG complete - results pending 

## 2020-08-12 ENCOUNTER — Encounter (HOSPITAL_COMMUNITY): Payer: Self-pay | Admitting: Neurosurgery

## 2020-08-12 ENCOUNTER — Inpatient Hospital Stay (HOSPITAL_COMMUNITY): Payer: Self-pay

## 2020-08-12 DIAGNOSIS — I636 Cerebral infarction due to cerebral venous thrombosis, nonpyogenic: Secondary | ICD-10-CM

## 2020-08-12 DIAGNOSIS — Z9911 Dependence on respirator [ventilator] status: Secondary | ICD-10-CM

## 2020-08-12 LAB — SODIUM
Sodium: 154 mmol/L — ABNORMAL HIGH (ref 135–145)
Sodium: 155 mmol/L — ABNORMAL HIGH (ref 135–145)
Sodium: 154 mmol/L — ABNORMAL HIGH (ref 135–145)
Sodium: 156 mmol/L — ABNORMAL HIGH (ref 135–145)

## 2020-08-12 LAB — CBC
HCT: 26.4 % — ABNORMAL LOW (ref 36.0–46.0)
Hemoglobin: 7.4 g/dL — ABNORMAL LOW (ref 12.0–15.0)
MCH: 18.6 pg — ABNORMAL LOW (ref 26.0–34.0)
MCHC: 28 g/dL — ABNORMAL LOW (ref 30.0–36.0)
MCV: 66.5 fL — ABNORMAL LOW (ref 80.0–100.0)
Platelets: 291 10*3/uL (ref 150–400)
RBC: 3.97 MIL/uL (ref 3.87–5.11)
RDW: 23.6 % — ABNORMAL HIGH (ref 11.5–15.5)
WBC: 16.5 10*3/uL — ABNORMAL HIGH (ref 4.0–10.5)
nRBC: 0 % (ref 0.0–0.2)

## 2020-08-12 LAB — BASIC METABOLIC PANEL
Anion gap: 7 (ref 5–15)
BUN: 10 mg/dL (ref 6–20)
CO2: 25 mmol/L (ref 22–32)
Calcium: 7.9 mg/dL — ABNORMAL LOW (ref 8.9–10.3)
Chloride: 123 mmol/L — ABNORMAL HIGH (ref 98–111)
Creatinine, Ser: 0.66 mg/dL (ref 0.44–1.00)
GFR, Estimated: >60 mL/min (ref >60)
Glucose, Bld: 316 mg/dL — ABNORMAL HIGH (ref 70–99)
Potassium: 3.4 mmol/L — ABNORMAL LOW (ref 3.5–5.1)
Sodium: 155 mmol/L — ABNORMAL HIGH (ref 135–145)

## 2020-08-12 LAB — POCT I-STAT 7, (LYTES, BLD GAS, ICA,H+H)
Acid-Base Excess: 4 mmol/L — ABNORMAL HIGH (ref 0.0–2.0)
Bicarbonate: 26.9 mmol/L (ref 20.0–28.0)
Calcium, Ion: 1.12 mmol/L — ABNORMAL LOW (ref 1.15–1.40)
HCT: 22 % — ABNORMAL LOW (ref 36.0–46.0)
Hemoglobin: 7.5 g/dL — ABNORMAL LOW (ref 12.0–15.0)
O2 Saturation: 100 %
Patient temperature: 100.8
Potassium: 3.4 mmol/L — ABNORMAL LOW (ref 3.5–5.1)
Sodium: 159 mmol/L — ABNORMAL HIGH (ref 135–145)
TCO2: 28 mmol/L (ref 22–32)
pCO2 arterial: 35.6 mmHg (ref 32.0–48.0)
pH, Arterial: 7.491 — ABNORMAL HIGH (ref 7.350–7.450)
pO2, Arterial: 182 mmHg — ABNORMAL HIGH (ref 83.0–108.0)

## 2020-08-12 LAB — GLUCOSE, CAPILLARY
Glucose-Capillary: 243 mg/dL — ABNORMAL HIGH (ref 70–99)
Glucose-Capillary: 265 mg/dL — ABNORMAL HIGH (ref 70–99)
Glucose-Capillary: 286 mg/dL — ABNORMAL HIGH (ref 70–99)
Glucose-Capillary: 289 mg/dL — ABNORMAL HIGH (ref 70–99)
Glucose-Capillary: 298 mg/dL — ABNORMAL HIGH (ref 70–99)
Glucose-Capillary: 299 mg/dL — ABNORMAL HIGH (ref 70–99)

## 2020-08-12 LAB — HEPARIN LEVEL (UNFRACTIONATED)
Heparin Unfractionated: 0.12 IU/mL — ABNORMAL LOW (ref 0.30–0.70)
Heparin Unfractionated: 0.16 IU/mL — ABNORMAL LOW (ref 0.30–0.70)
Heparin Unfractionated: 0.23 IU/mL — ABNORMAL LOW (ref 0.30–0.70)

## 2020-08-12 LAB — MAGNESIUM: Magnesium: 2.3 mg/dL (ref 1.7–2.4)

## 2020-08-12 IMAGING — CT CT HEAD W/O CM
4 series · 15 of 47 positions shown, 17 images · non-contrast
Comparison: Brain MRI [DATE].  Head CT [DATE] and earlier.

CLINICAL DATA: 37-year-old female status post hemorrhagic right MCA
and ACA region infarcts. Dural sinus thrombosis. Craniectomy.

EXAM:
CT HEAD WITHOUT CONTRAST
TECHNIQUE: Contiguous axial images were obtained from the base of the skull
through the vertex without intravenous contrast.

[Series 3: head wo · axial · 0.43mm/px · z∈[-140,-20]mm · 7 of 34 slices shown, 9 images]
[im 5/34  brain]
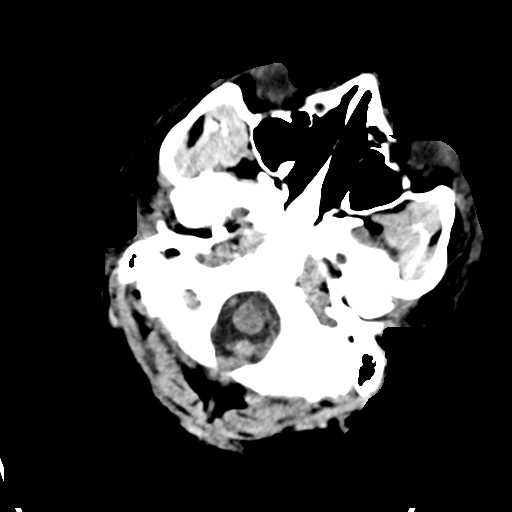
[im 5/34  bone]
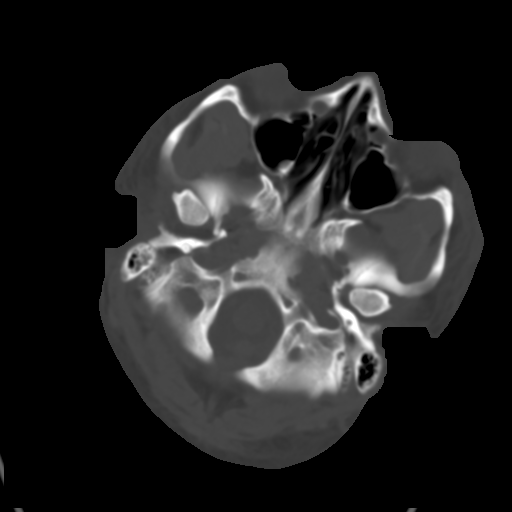
[im 9/34  brain]
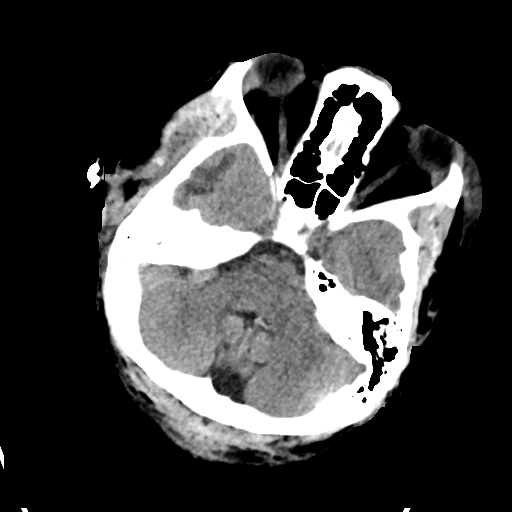
[im 13/34  brain]
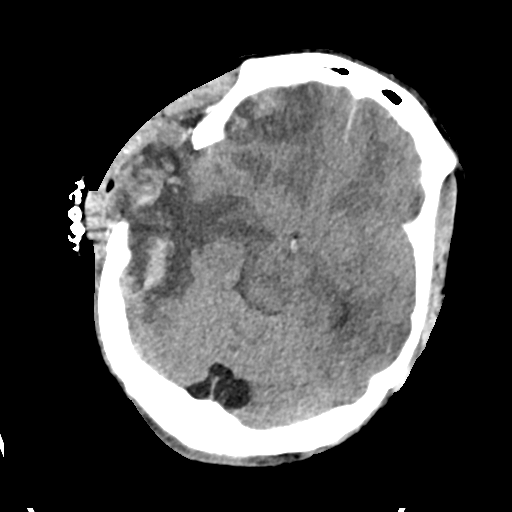
[im 17/34  brain]
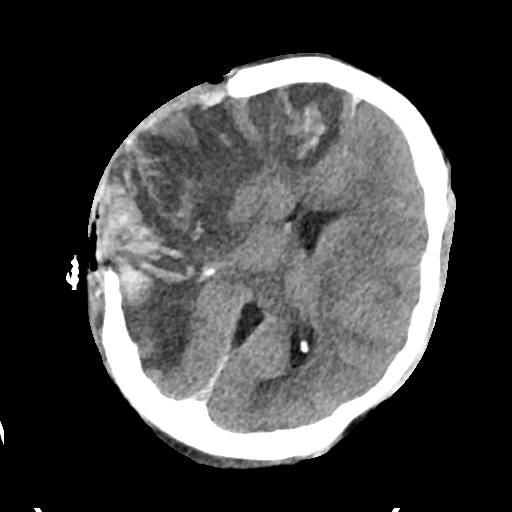
[im 21/34  brain]
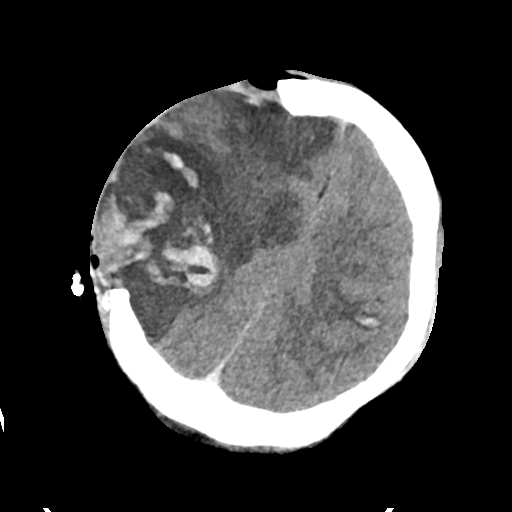
[im 21/34  bone]
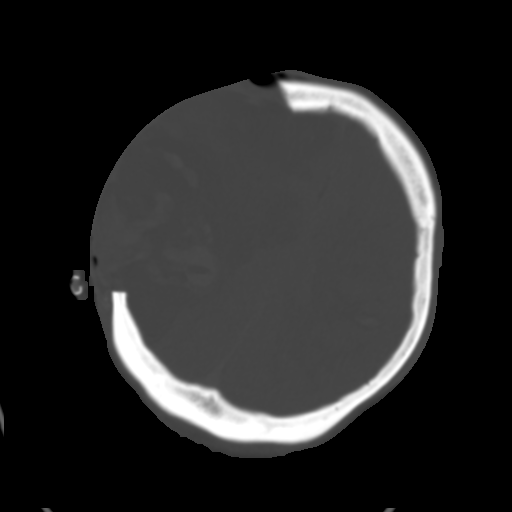
[im 25/34  brain]
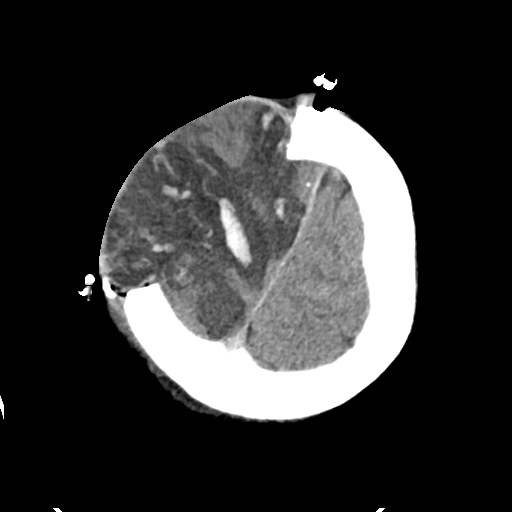
[im 29/34  brain]
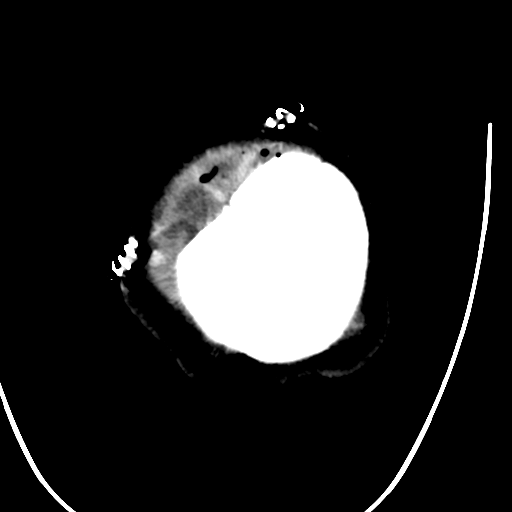

[Series 4: head bone · axial · 0.43mm/px · z∈[-144,-128]mm · 2 of 85 slices shown]
[im 9/85  bone]
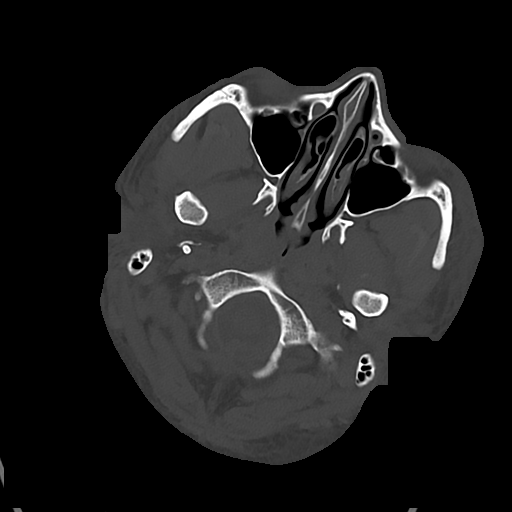
[im 17/85  bone]
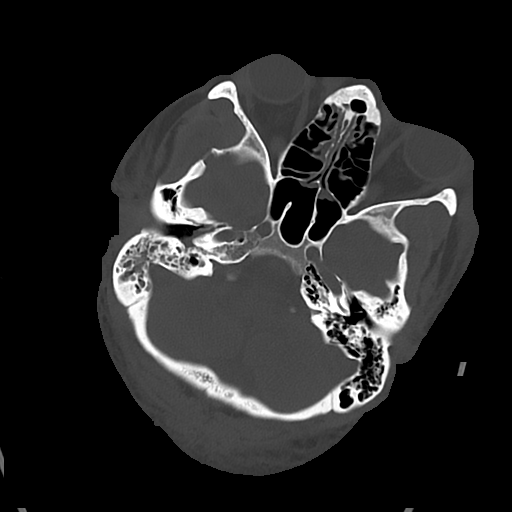

[Series 5: cor soft · coronal · 0.35mm/px · 3 of 74 slices shown]
[im 25/74  brain]
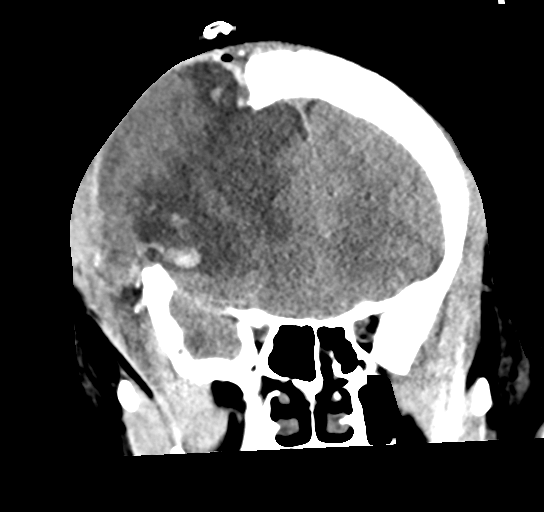
[im 33/74  brain]
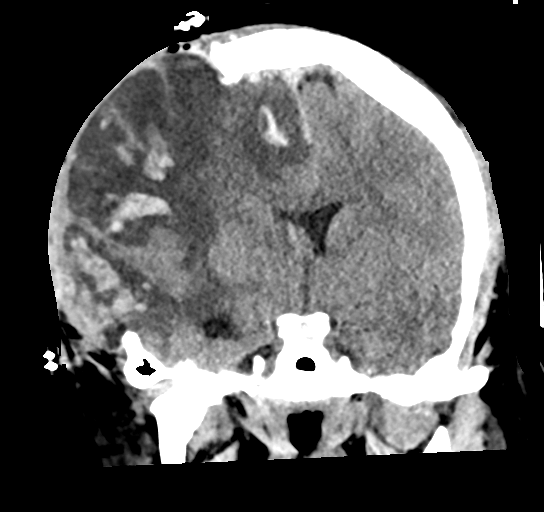
[im 41/74  brain]
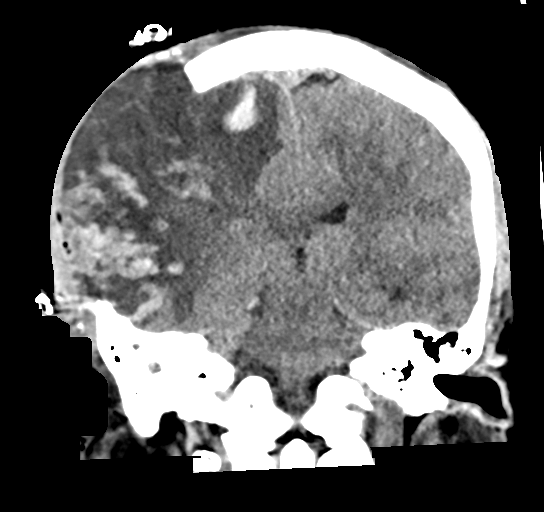

[Series 6: sag soft · sagittal · 0.33mm/px · 3 of 63 slices shown]
[im 21/63  brain]
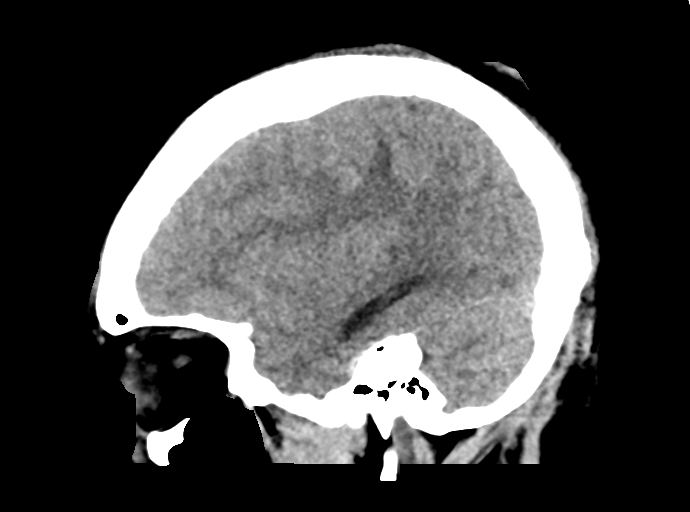
[im 32/63  brain]
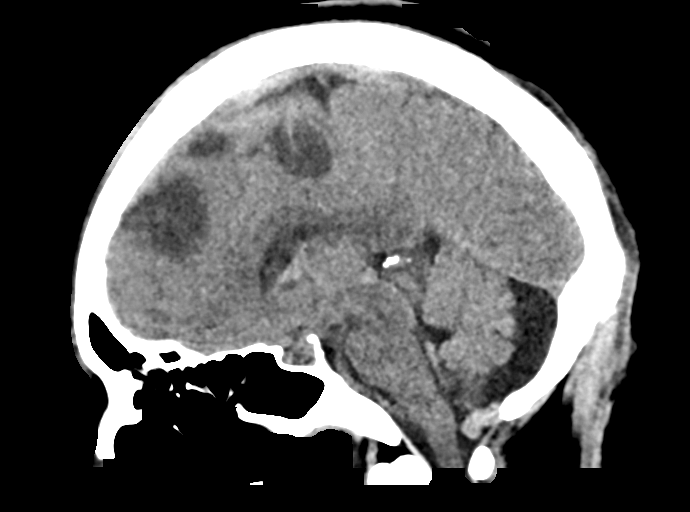
[im 42/63  brain]
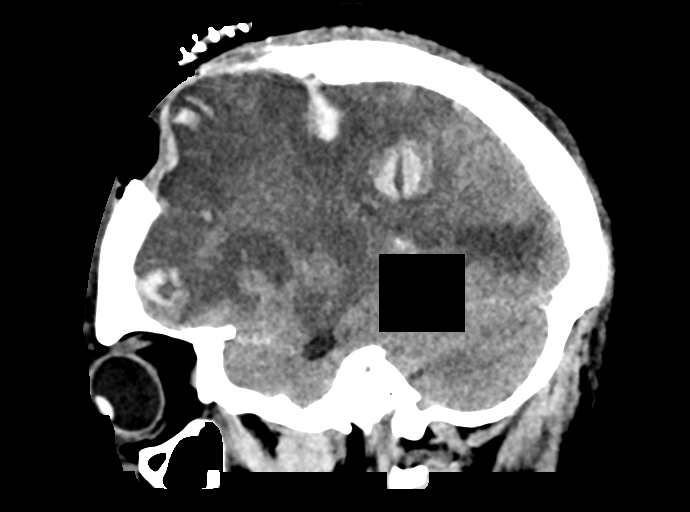

[15 of 47 positions shown; findings below may reference images not displayed]

FINDINGS: Brain: Extensive hemorrhagic infarction of the right hemisphere,
relatively sparing the right deep gray nuclei. Progressed right
hemisphere edema since the CT on [DATE], not significantly
different from the MRI yesterday. Extension of brain parenchyma
through the right side craniectomy is stable. Associated leftward
midline shift of 6 mm at the anterior septum pellucidum appears
mildly increased. Effaced suprasellar cistern is stable. Other
basilar cisterns are preserved.

Stable smaller left hemisphere infarcts with occasional hemorrhage
from the MRI yesterday (series 3, image 22).

Brainstem and cerebellum remain spared.

Lateral ventricle mass effect has increased since [DATE], stable
since yesterday. Small volume of intraventricular hemorrhage is new
since [DATE]. No ventriculomegaly. No other extra-axial blood
identified.

Vascular: Hyperdensity associated with superior sagittal sinus
thrombosis again noted.

Skull: Stable right hemi craniectomy.

Sinuses/Orbits: Right tympanic cavity and mastoid opacification is
stable. Other Visualized paranasal sinuses and mastoids are stable
and well aerated.

Other: Postoperative changes to the scalp. Stable associated
broad-based scalp hematoma. Disconjugate gaze.
IMPRESSION: 1. Right greater than left hemorrhagic infarcts are stable from the
MRI yesterday, progressed since the [DATE] CT.
2. A small volume of intraventricular hemorrhage is new. But mass
effect on the ventricles is stable with no ventriculomegaly.
3. Intracranial mass effect stable from yesterday, with leftward
midline shift at the anterior septum pellucidum up to 6 mm and
effaced suprasellar cistern.
4. Superior sagittal sinus thrombosis.  Right hemi craniectomy.

## 2020-08-12 MED ORDER — INSULIN DETEMIR 100 UNIT/ML ~~LOC~~ SOLN
10.0000 [IU] | Freq: Two times a day (BID) | SUBCUTANEOUS | Status: DC
  Administered 2020-08-12: 10 [IU] via SUBCUTANEOUS
  Filled 2020-08-12 (×2): qty 0.1

## 2020-08-12 MED ORDER — INSULIN DETEMIR 100 UNIT/ML ~~LOC~~ SOLN
10.0000 [IU] | Freq: Once | SUBCUTANEOUS | Status: AC
  Administered 2020-08-12: 10 [IU] via SUBCUTANEOUS
  Filled 2020-08-12: qty 0.1

## 2020-08-12 MED ORDER — SODIUM CHLORIDE 0.9 % IV SOLN: INTRAVENOUS | Status: DC

## 2020-08-12 MED ORDER — LEVETIRACETAM 100 MG/ML PO SOLN
500.0000 mg | Freq: Two times a day (BID) | ORAL | Status: DC
  Administered 2020-08-12 – 2020-08-16 (×8): 500 mg
  Filled 2020-08-12 (×8): qty 5

## 2020-08-12 MED ORDER — INSULIN DETEMIR 100 UNIT/ML ~~LOC~~ SOLN
20.0000 [IU] | Freq: Two times a day (BID) | SUBCUTANEOUS | Status: DC
  Administered 2020-08-12 – 2020-08-16 (×8): 20 [IU] via SUBCUTANEOUS
  Filled 2020-08-12 (×9): qty 0.2

## 2020-08-12 MED ORDER — POTASSIUM CHLORIDE 20 MEQ PO PACK
40.0000 meq | PACK | Freq: Once | ORAL | Status: AC
  Administered 2020-08-12: 40 meq
  Filled 2020-08-12: qty 2

## 2020-08-12 MED ORDER — INSULIN ASPART 100 UNIT/ML IJ SOLN
4.0000 [IU] | INTRAMUSCULAR | Status: DC
  Administered 2020-08-12 – 2020-08-13 (×4): 4 [IU] via SUBCUTANEOUS

## 2020-08-12 MED ORDER — BISACODYL 10 MG RE SUPP
10.0000 mg | Freq: Once | RECTAL | Status: AC
  Administered 2020-08-12: 10 mg via RECTAL
  Filled 2020-08-12: qty 1

## 2020-08-12 NOTE — Progress Notes (Signed)
Inpatient Diabetes Program Recommendations  AACE/ADA: New Consensus Statement on Inpatient Glycemic Control (2015)  Target Ranges:  Prepandial:   less than 140 mg/dL      Peak postprandial:   less than 180 mg/dL (1-2 hours)      Critically ill patients:  140 - 180 mg/dL   Lab Results  Component Value Date   GLUCAP 243 (H) 08/12/2020   HGBA1C 12.9 (H) 2020-08-11    Review of Glycemic Control Results for Regina Morris, Regina Morris (MRN [id number]) as of 08/12/2020 10:48  Ref. Range 08/11/2020 15:54 08/11/2020 19:49 08/11/2020 23:26 08/12/2020 03:40 08/12/2020 07:49  Glucose-Capillary Latest Ref Range: 70 - 99 mg/dL 694 (H) 854 (H) 627 (H) 299 (H) 243 (H)   Diabetes history:  DM2 Outpatient Diabetes medications:  Metformin 500 mg daily Current orders for Inpatient glycemic control:  Levemir 10 units BID (starting today) Novolog 0-15 units Q4H Jevity 1.5 @ 55 ml/hr  Inpatient Diabetes Program Recommendations:     Noted Levemir 20 units BID added today.  If CBG's remain elevated, please consider:  Novolog 3 units Q4H tube feed coverage.  Stop if feeds are held or discontinued.  Will continue to follow while inpatient.  Thank you, Dulce Sellar, RN, BSN Diabetes Coordinator Inpatient Diabetes Program [phone] (team pager from 8a-5p)

## 2020-08-12 NOTE — Progress Notes (Signed)
Updated Dr. Otelia Limes of sodium of 154. New orders for normal saline at 30 mL per hour, and to stop the hypertonic saline. Will continue to monitor.

## 2020-08-12 NOTE — Progress Notes (Signed)
    Progress Note from the Palliative Medicine Team at [facility]   Patient Name: Regina Morris Texta        Date: 08/12/2020 DOB: 12-11-1980  Age: 40 y.o. MRN#: [id number] Attending Physician: Stroke, Md, MD Primary Care Physician: Patient, No Pcp Per (Inactive) Admit Date: 07/27/2020   Medical records reviewed   40 y.o. female  admitted on 08/01/2020 with   with headache nausea and vomiting to [facility] on 5/26. 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  Transferred to [facility]. Started on keppra. Mental status worsened 5/27 evening and the morning of 5/28 CT head showed worsening midline shift.  She was taken to the OR for decompressive hemicraniotomy She had rsection of non-viable brain tissue during hemicraniotomy and a bone flap was placed when she was closed.   Today is day 5 of this hospitalization.  She remains intubated.  Long term prognosis is l;ikely poor for meaningful recovery    Family face treatment option decisions, advanced directive decisions and anticipatory care needs.  This NP visited patient at the bedside as a follow up to  yesterday's GOCs meeting, as scheduled with patient, daughter and friend along with Spanish interpreter for continued conversation regarding current medical situation.  Introduced the role of palliative medicine in holistic treatment plan.  Dr. Roda Shutters updated family from neurologic aspect answering their questions and concerns.  This nurse practitioner remained with family offering education  on trajectory of long-term critical illness.  We discussed trach and PEG.  Increased risk of infection, skin breakdown, need for SNF placement.      Education offered on the  importance of continued conversation with family and their  medical providers regarding overall plan of care and treatment options,  ensuring decisions are within the  context of the patients values and GOCs.  PMT will continue to support holistically.   Plan is for follow-up family meeting next week.  Questions and concerns addressed   Discussed with Dr Roda Shutters and bedside RN  Total time spent on the unit was 60 minutes  Greater than 50% of the time was spent in counseling and coordination of care  Lorinda Creed NP  Palliative Medicine Team Team Phone # [phone] Pager [phone]

## 2020-08-12 NOTE — Progress Notes (Signed)
ANTICOAGULATION CONSULT NOTE  Pharmacy Consult:  Heparin Indication:  Cerebral venous sinus thrombosis  Labs: Recent Labs    08/10/20 0619 08/10/20 1632 08/11/20 0408 08/11/20 0425 08/11/20 0652 08/11/20 1956 08/12/20 0405 08/12/20 0409  HGB 6.2*   < > 7.4*   < > 7.7*  --  7.5* 7.4*  HCT 23.8*   < > 26.8*   < > 27.3*  --  22.0* 26.4*  PLT 400  --  286  --   --   --   --  291  HEPARINUNFRC  --   --   --   --   --  <0.10*  --  0.12*  CREATININE 0.70  --  0.73  --   --   --   --  0.66   < > = values in this interval not displayed.    Assessment: 77 YOF presented with nausea, vomiting and right temple headache, found to have hemorrhagic R MCA infarct with midline shift and mass effect.  Most recent CT on 5/29 showed progressive edema with newly seen subcortical hemorrhage in the left frontal region.  Subsequent venogram positive for dural venous sinus thrombosis.  Pharmacy consulted to dose IV heparin, low goal and not bolus.   Heparin level this morning remains SUBtherapeutic (HL 0.16 << 0.12, goal of 0.3-0.5). CT today shows stable hemorrhagic infarcts from MRI on 5/30. Hgb 7.4 << 7.7, HDW~75 kg  Goal of Therapy:  Heparin level 0.3-0.5 units/ml Monitor platelets by anticoagulation protocol: Yes   Plan:  - Increase Heparin infusion to 1300 units/hr - No bolus and aiming for low goal 0.3-0.5 - Will continue to monitor for any signs/symptoms of bleeding and will follow up with heparin level in 6 hours   Thank you for allowing pharmacy to be a part of this patient's care.  Georgina Pillion, PharmD, BCPS Clinical Pharmacist Clinical phone for 08/12/2020: [phone] 08/12/2020 1:13 PM   **Pharmacist phone directory can now be found on [email or web address] ([email or web address]).  Listed under [facility].

## 2020-08-12 NOTE — Progress Notes (Signed)
ANTICOAGULATION CONSULT NOTE  Pharmacy Consult:  Heparin Indication:  Cerebral venous sinus thrombosis  Labs: Recent Labs    08/10/20 0619 08/10/20 1632 08/11/20 0408 08/11/20 0425 08/11/20 0175 08/11/20 1956 08/12/20 0405 08/12/20 0409 08/12/20 1219 08/12/20 1921  HGB 6.2*   < > 7.4*   < > 7.7*  --  7.5* 7.4*  --   --   HCT 23.8*   < > 26.8*   < > 27.3*  --  22.0* 26.4*  --   --   PLT 400  --  286  --   --   --   --  291  --   --   HEPARINUNFRC  --   --   --   --   --    < >  --  0.12* 0.16* 0.23*  CREATININE 0.70  --  0.73  --   --   --   --  0.66  --   --    < > = values in this interval not displayed.    Assessment: 89 YOF presented with nausea, vomiting and right temple headache, found to have hemorrhagic R MCA infarct with midline shift and mass effect.  Most recent CT on 5/29 showed progressive edema with newly seen subcortical hemorrhage in the left frontal region.  Subsequent venogram positive for dural venous sinus thrombosis.  Pharmacy consulted to dose IV heparin, low goal and not bolus.   Heparin level this morning remains SUBtherapeutic (HL 0.16 << 0.12, goal of 0.3-0.5). CT today shows stable hemorrhagic infarcts from MRI on 5/30. Hgb 7.4 << 7.7, HDW~75 kg  PM f/u > heparin level this evening is still slightly below goal.  No overt bleeding or complications noted.  Goal of Therapy:  Heparin level 0.3-0.5 units/ml Monitor platelets by anticoagulation protocol: Yes   Plan:  - Increase Heparin infusion to 1400 units/hr - No bolus and aiming for low goal 0.3-0.5 - Will continue to monitor for any signs/symptoms of bleeding and will follow up with heparin level in 6 hours   Thank you for allowing pharmacy to be a part of this patient's care.  Reece Leader, Colon Flattery, BCCP Clinical Pharmacist  08/12/2020 9:00 PM   [facility] pharmacy phone numbers are listed on [email or web address]

## 2020-08-12 NOTE — Progress Notes (Signed)
Pt transported to and from CT without event.  

## 2020-08-12 NOTE — Progress Notes (Signed)
PT Cancellation Note  Patient Details Name: Regina Morris MRN: [id number] DOB: [birth year]   Cancelled Treatment:    Reason Eval/Treat Not Completed: Medical issues which prohibited therapy Discussed with RN; pt remains intubated and unresponsive. Not appropriate for therapy at this time. Will sign off. Please re-consult if status changes.  Lillia Pauls, PT, DPT Acute Rehabilitation Services Pager [phone] Office [phone]    Norval Morton 08/12/2020, 9:02 AM

## 2020-08-12 NOTE — Progress Notes (Addendum)
This chaplain is responding to PMT consult for spiritual care.  This chaplain understands the Pt. is Catholic and requesting a priest visit.  The chaplain phoned Father Minerva Areola. A voicemail was left requesting a follow up phone call to the chaplain.  *[phone] This chaplain shared Father Eric's contact information with the RN-Lauren for F/U with the Pt. family. Father Minerva Areola prefers to coordinate a visit through the family.  The Pt. family is requested to make the first contact.  Chaplain Stephanie Acre [phone]

## 2020-08-12 NOTE — Progress Notes (Signed)
STROKE TEAM PROGRESS NOTE   INTERVAL HISTORY No family is at the bedside. Pending family meeting at 1pm. Pt still intubated, on vent. Off sedation. Not responsive, mild withdraw in all extremities on pain. Brainstem reflex intact. Right side head bone flap still tense. CT repeat showed increased hemorrhagic conversion from 2 days ago but stable from yesterday. Mild MLS to the left. No hydro.   Vitals:   08/12/20 0813 08/12/20 0840 08/12/20 0900 08/12/20 0930  BP: (!) 166/86 (!) 148/75 (!) 155/75 (!) 141/65  Pulse: 78 99 75 67  Resp: (!) 24 (!) 24 (!) 28 (!) 27  Temp:      TempSrc:      SpO2: 100% 100% 100% 100%  Weight:      Height:       CBC:  Recent Labs  Lab 08/11/20 0408 08/11/20 0425 08/12/20 0405 08/12/20 0409  WBC 17.4*  --   --  16.5*  HGB 7.4*   < > 7.5* 7.4*  HCT 26.8*   < > 22.0* 26.4*  MCV 67.0*  --   --  66.5*  PLT 286  --   --  291   < > = values in this interval not displayed.   Basic Metabolic Panel:  Recent Labs  Lab 08/08/20 0253 08/08/20 0939 08/10/20 1031 08/10/20 1632 08/11/20 0408 08/11/20 0425 08/11/20 0856 08/11/20 1418 08/12/20 0405 08/12/20 0409  NA 143   < >  --    < > 155*   < > 157*   < > 159* 155*  155*  K 3.6   < >  --   --  3.0*   < >  --   --  3.4* 3.4*  CL 113*   < >  --   --  126*  --   --   --   --  123*  CO2 20*   < >  --   --  21*  --   --   --   --  25  GLUCOSE 194*   < >  --   --  175*  --   --   --   --  316*  BUN 10   < >  --   --  10  --   --   --   --  10  CREATININE 0.53   < >  --   --  0.73  --   --   --   --  0.66  CALCIUM 8.2*   < >  --   --  8.0*  --   --   --   --  7.9*  MG 2.0  --  1.8  --   --   --   --   --   --  2.3  PHOS 1.9*  --   --   --   --   --  2.2*  --   --   --    < > = values in this interval not displayed.   Lipid Panel:  Recent Labs  Lab 08/13/20 0755  CHOL 276*  TRIG 168*  HDL 45  CHOLHDL 6.1  VLDL 34  LDLCALC 272*   HgbA1c:  Recent Labs  Lab 2020/08/13 0755  HGBA1C 12.9*   Urine  Drug Screen:  Recent Labs  Lab 08/13/2020 1258  LABOPIA NONE DETECTED  COCAINSCRNUR NONE DETECTED  LABBENZ NONE DETECTED  AMPHETMU NONE DETECTED  THCU NONE DETECTED  LABBARB NONE DETECTED  Alcohol Level  Recent Labs  Lab 07/17/2020 0424  ETH <10    IMAGING  CT ABDOMEN PELVIS WO CONTRAST Result Date: 07/23/2020 IMPRESSION: No acute intra-abdominal pathology identified.   DG Chest 2 View Result Date: 07/30/2020 MPRESSION: No active cardiopulmonary disease.   CT Head Wo Contrast Result Date: 07/22/2020 IMPRESSION: Large posterior right MCA territory infarct involving the posterior right temporal cortex with superimposed moderate subarachnoid hemorrhage interdigitating within the sulci of the infarcted region and tiny subdural hematoma. Mild mass effect. No midline shift.   MR ANGIO HEAD WO CONTRAST Result Date: 07/14/2020 IMPRESSION:  1. Significantly degraded by motion.  2. Narrowed/irregularity right M2 branches correlating with the acute infarct. No detectable arterial disease elsewhere.   MR BRAIN WO CONTRAST Result Date: 08/02/2020 CT. IMPRESSION:  1. Acute infarct involving the inferior division right MCA territory with petechial hemorrhage and 4 mm midline shift.  2. Diffusion only study due to patient condition.   DG Abd 1 View  Result Date: 08/10/2020 CLINICAL DATA:  NG tube placement. EXAM: ABDOMEN - 1 VIEW COMPARISON:  Aug 08, 2020 FINDINGS: Limited view of the abdomen demonstrates the tip of feeding catheter at the expected location of the gastric antrum. Nonobstructive/nonspecific bowel gas pattern. Contrast opacification of the urinary bladder. No radio-opaque calculi or other significant radiographic abnormality are seen. IMPRESSION: Tip of feeding catheter at the expected location of the gastric antrum. Electronically Signed   By: Ted Mcalpine M.D.   On: 08/10/2020 11:40   CT HEAD WO CONTRAST  Result Date: 08/12/2020 CLINICAL DATA:  40 year old female  status post hemorrhagic right MCA and ACA region infarcts. Dural sinus thrombosis. Craniectomy. EXAM: CT HEAD WITHOUT CONTRAST TECHNIQUE: Contiguous axial images were obtained from the base of the skull through the vertex without intravenous contrast. COMPARISON:  Brain MRI 08/11/2020.  Head CT 08/10/2020 and earlier. FINDINGS: Brain: Extensive hemorrhagic infarction of the right hemisphere, relatively sparing the right deep gray nuclei. Progressed right hemisphere edema since the CT on 08/10/2020, not significantly different from the MRI yesterday. Extension of brain parenchyma through the right side craniectomy is stable. Associated leftward midline shift of 6 mm at the anterior septum pellucidum appears mildly increased. Effaced suprasellar cistern is stable. Other basilar cisterns are preserved. Stable smaller left hemisphere infarcts with occasional hemorrhage from the MRI yesterday (series 3, image 22). Brainstem and cerebellum remain spared. Lateral ventricle mass effect has increased since 08/10/2020, stable since yesterday. Small volume of intraventricular hemorrhage is new since 08/10/2020. No ventriculomegaly. No other extra-axial blood identified. Vascular: Hyperdensity associated with superior sagittal sinus thrombosis again noted. Skull: Stable right hemi craniectomy. Sinuses/Orbits: Right tympanic cavity and mastoid opacification is stable. Other Visualized paranasal sinuses and mastoids are stable and well aerated. Other: Postoperative changes to the scalp. Stable associated broad-based scalp hematoma. Disconjugate gaze. IMPRESSION: 1. Right greater than left hemorrhagic infarcts are stable from the MRI yesterday, progressed since the 08/10/2020 CT. 2. A small volume of intraventricular hemorrhage is new. But mass effect on the ventricles is stable with no ventriculomegaly. 3. Intracranial mass effect stable from yesterday, with leftward midline shift at the anterior septum pellucidum up to 6 mm and  effaced suprasellar cistern. 4. Superior sagittal sinus thrombosis.  Right hemi craniectomy. Electronically Signed   By: Odessa Fleming M.D.   On: 08/12/2020 05:56   MR BRAIN WO CONTRAST  Result Date: 08/11/2020 CLINICAL DATA:  Stroke follow-up. EXAM: MRI HEAD WITHOUT CONTRAST TECHNIQUE: Multiplanar, multiecho pulse sequences of the brain and surrounding structures  were obtained without intravenous contrast. COMPARISON:  CT head Aug 10, 2020. FINDINGS: Brain: Extensive acute infarction throughout the right cerebral convexity, including right MCA and ACA territories.There is exuberant associated cytotoxic edema with areas of hemorrhagic transformation. The extent of hemorrhage and edema may be mildly progressed from the prior CT from Aug 10, 2020, although comparison is difficult across modalities. Prior right craniectomy with brain herniating through the defect. Slight paradoxical rightward midline shift as result (2 mm at the foramen of [place]). Additional smaller infarcts in the left frontal parietal white matter and left occipital cortex. Left parietal acute hemorrhage is likely similar. No hydrocephalus. Mildly prominent retro cerebellar CSF, likely chronic. Vascular: Major arterial flow voids at the skull base are maintained. Please see prior CTA for further arterial evaluation. Additionally, please see recent CT of the and v for evaluation of the dural venous sinuses. Skull and upper cervical spine: Normal marrow signal. Sinuses/Orbits: Mild mucosal thickening. No acute orbital abnormality. Other: Large right mastoid effusion. IMPRESSION: 1. Extensive acute infarction throughout the right cerebral convexity, including right MCA and ACA territories. Exuberant associated edema with areas of hemorrhagic transformation. The extent of hemorrhage and edema may be mildly progressed from the recent CT from Aug 10, 2020, although comparison is difficult across modalities. Repeat CT could along more direct comparison if  clinically indicated. 2. Prior right craniectomy with brain herniating through the defect. Slight paradoxical rightward midline shift as result (2 mm at the foramen of [place]). 3. Additional smaller infarcts in the left frontal parietal white matter and left occipital cortex. 4. Left parietal acute hemorrhage is likely similar. 5. Large right mastoid effusion. 6. Please see recent CTA and CTV for vascular findings. Electronically Signed   By: Feliberto Harts MD   On: 08/11/2020 14:39   DG CHEST PORT 1 VIEW  Result Date: 08/10/2020 CLINICAL DATA:  Intracranial hemorrhage. EXAM: PORTABLE CHEST 1 VIEW COMPARISON:  June 07, 2020 FINDINGS: Endotracheal tube in satisfactory position. Enteric catheter with tip overlying the expected location of the gastric antrum. Cardiomediastinal silhouette is normal. Mediastinal contours appear intact. There is no evidence of focal airspace consolidation, pleural effusion or pneumothorax. Osseous structures are without acute abnormality. Soft tissues are grossly normal. IMPRESSION: No active disease. Electronically Signed   By: Ted Mcalpine M.D.   On: 08/10/2020 11:41   DG Abd Portable 1V  Result Date: 08/11/2020 CLINICAL DATA:  40 year old female status post orogastric tube placement. EXAM: PORTABLE ABDOMEN - 1 VIEW COMPARISON:  Abdominal radiograph 08/10/2020. FINDINGS: Enteric tube noted with tip and side port in the body of the stomach. The bowel gas pattern is normal. No radio-opaque calculi or other significant radiographic abnormality are seen. IMPRESSION: 1. Enteric tube positioned with tip and side port in the body of the stomach. 2. Nonobstructive bowel gas pattern. Electronically Signed   By: Trudie Reed M.D.   On: 08/11/2020 12:53   DG Abd Portable 1V  Result Date: 08/10/2020 CLINICAL DATA:  40 year old female status post enteric tube placement. EXAM: PORTABLE ABDOMEN - 1 VIEW COMPARISON:  Earlier radiograph dated 08/10/2020. FINDINGS: The  previously seen feeding tube has been removed and not visualized on this radiograph. No bowel dilatation or evidence of obstruction. Cutaneous surgical clips noted over the right lower quadrant. The osseous structures are intact. IMPRESSION: Interval removal of the feeding tube. No bowel dilatation. Electronically Signed   By: Elgie Collard M.D.   On: 08/10/2020 17:51   DG Abd Portable 1V  Result Date: 08/10/2020 CLINICAL  DATA:  40 year old female status post feeding tube placement. EXAM: PORTABLE ABDOMEN - 1 VIEW COMPARISON:  Radiograph dated 05/27 FINDINGS: Feeding tube with tip just distal to the GE junction. Recommend further advancing. No bowel dilatation. The osseous structures and soft tissues are unremarkable. IMPRESSION: Feeding tube with tip just distal to the GE junction. Recommend further advancing. Electronically Signed   By: Elgie Collard M.D.   On: 08/10/2020 15:28   EEG adult  Result Date: 08/11/2020 Charlsie Quest, MD     08/11/2020  3:49 PM Patient Name: Regina Morris MRN: [id number] Epilepsy Attending: Charlsie Quest Referring Physician/Provider: Dr Marvel Plan Date: 08/11/2020 Duration: 24.17 mins  Patient history: 40 year old female with right MCA infarct with hemorrhagic transformation with worsening mental status.  EEG to evaluate for seizures.  Level of alertness:  Comatose  AEDs during EEG study: Keppra  Technical aspects: This EEG study was done with scalp electrodes positioned according to the 10-20 International system of electrode placement. Electrical activity was acquired at a sampling rate of  and reviewed with a high frequency filter of  and a low frequency filter of . EEG data were recorded continuously and digitally stored.  Description: EEG showed continuous generalized 3 to 5 Hz theta-delta slowing. Run of sharp waves was noted in right frontocentral region without definite evolution suggestive of brief ictal-interictal rhythmic discharges.   Hyperventilation and photic stimulation were not performed.    ABNORMALITY -Brief ictal-interictal rhythmic discharges, right frontocentral region -Continuous slow, generalized  IMPRESSION: This study showed evidence of epileptogenicity arising from right frontocentral region with high potential for seizures.  Additionally there is severe diffuse encephalopathy, nonspecific etiology. No definite seizures  were seen throughout the recording.  Priyanka Annabelle Harman      PHYSICAL EXAM  Temp:  [98.3 F (36.8 C)-101.9 F (38.8 C)] 101.9 F (38.8 C) (05/31 0800) Pulse Rate:  [65-104] 67 (05/31 0930) Resp:  [13-28] 27 (05/31 0930) BP: (127-174)/(59-97) 141/65 (05/31 0930) SpO2:  [99 %-100 %] 100 % (05/31 0930) FiO2 (%):  [40 %] 40 % (05/31 0813)  General - Well nourished, well developed, intubated off sedation.  Ophthalmologic - fundi not visualized due to noncooperation.  Cardiovascular - Regular rhythm and rate. Tachycardia with stimulation  Neuro - intubated off sedation, eyes closed, eyelids swollen, not open eyes on stimulation, not following commands. With forced eye opening, eyes in left gaze position, not blinking to visual threat, doll's eyes sluggish, not tracking, pupil equal size, brisk to light. Corneal reflex present bilaterally, gag and cough strongly present. Breathing over the vent.  Facial symmetry not able to test due to ET tube.  Tongue protrusion not cooperative. On pain stimulation, mild withdraw in all extremities. DTR diminished and no babinski. Sensation, coordination and gait not tested.   ASSESSMENT/PLAN Ms. Dicie Edelen is a 40 y.o. female with history of gestational diabetes, kidney stone  presenting with nausea, vomiting and R temple headache.  CT head demonstrated a right posterior MCA stroke with moderate subarachnoid hemorrhage interdigitating within the sulci of the infarcted region and tiny SDH with mild mass effect.   Stroke:  right posterior MCA  infarct with hemorrhagic conversion likely due to large vessel source given uncontrolled risk factors  MRI  Acute infarct involving the inferior division right MCA territory with petechial hemorrhage and 4 mm midline shift.   MRA Narrowed/irregularity right M2 branches correlating with the acute infarct.  CTA head and neck 5/29 - Very attenuated right MCA  branches similar to prior MRA with high-grade right M1 segment narrowing and no enhancing vessels at the level of initially visualized infarct.  MRI 5/30 showed Extensive acute infarction throughout the right cerebral convexity, including right MCA and ACA territories. Exuberant associated edema with areas of hemorrhagic transformation. The extent of hemorrhage and edema may be mildly progressed  CT head 5/31 Right greater than left hemorrhagic infarcts are stable from the MRI yesterday. Intracranial mass effect stable from yesterday, with leftward midline shift at the anterior septum pellucidum up to 6 mm and effaced suprasellar cistern. Superior sagittal sinus thrombosis.  Carotid Doppler unremarkable  2D Echo EF 60-65%   SARs coronavirus negative  Hypercoag work up pending  TCD bubble study no PFO  LDL 197  HgbA1c 12.9  VTE prophylaxis - SCDs  No antithrombotic/anticoagulant prior to admission, now on heparin IV per stroke protocol  Therapy recommendations:  Re-order once appropriate  Disposition:  Pending  Cerebral venous sinus thrombosis  ? Post procedure ? Procedure related  CT head 5/29 multifocal ICH with right parietal, right frontal and left frontal  CTV 5/29 venous sinus thrombosis SSS and right transver-sigmoid drainage  discussed with NSG Dr. Valeta Harms - OK for anticoagulation  Discussed with Dr. Sherlon Handing - I feel pt is poor candidate for further intervention given current medical condition  on heparin IV    Cerebral Edema Uncal herniation s/p [facility]  CT repeat 5/28 showed MLS worsened to 8-75mm  Exam  concerning for early uncal herniation on the right  S/p [facility] with Dr. Venetia Maxon (NSG)  CT repeat 5/29 - resolved MLS  On HTS at 75cc/hr -> 1/2NS @ 50 -> NS @ 30  Na 135->136->140->143->149->154->156->160->155  CT head 5/31 Right greater than left hemorrhagic infarcts are stable from the MRI yesterday. Intracranial mass effect stable from yesterday, with leftward midline shift at the anterior septum pellucidum up to 6 mm and effaced suprasellar cistern.   Probable seizure  EEG 5/27 potential seizure focus on the right  EEG 5/30 epileptogenicity arising from right frontocentral region with high potential for seizures.    Consider LTM if pt further neuro changes  On Keppra 500mg  bid  Seizure precautions  Diabetes, uncontrolled   Home meds:  Metformin 500mg  daily  HgbA1c 12.9, goal < 7.0  CBGs  Hyperglycemia with TF  Put on levemir 10U bid  SSI Q4h  Diabetes coordinator consult  Hypertension  Home meds:  none  Stable . SBP goal < 160 due to hemorrhagic conversion . Long-term BP goal normotensive  Hyperlipidemia  Home meds:  none  LDL 197 goal < 70  On lipitor 80  Continue statin on discharge  Leukocytosis Fever, ? central   WBC 20->17.5->16.4->21.3->17.4->16.5  Tmax 99.9->100->101.2->100.6->101.9  UA neg  CXR neg  Blood culture NGTD  Severe anemia with acute blood loss  Baseline anemia   This admission, Hb 8.5->7.5->6.8->6.2->PRBC->7.7->7.4  S/p PRBC x 1  Continue heparin IV for now  Other Stroke Risk Factors    Other Active Problems  Thrombocytosis platelet 715->524->400->286->291  Hospital day #5  This patient is critically ill due to large right brain stroke with HT, venous sinus thrombosis, seizure, cerebral edema, hyperglycemia, severe anemia, s/p [facility] and at significant risk of neurological worsening, death form brain herniation, further stroke or ICH, status epilepticus, DKA, sepsis. This patient's care requires constant  monitoring of vital signs, hemodynamics, respiratory and cardiac monitoring, review of multiple databases, neurological assessment, discussion with family, other specialists and medical decision making of high complexity.  I spent 40 minutes of neurocritical care time in the care of this patient.   Marvel PlanJindong Ambyr Qadri, MD PhD Stroke Neurology 08/12/2020 10:21 AM   To contact Stroke Continuity provider, please refer to [email or web address]. After hours, contact General Neurology

## 2020-08-12 NOTE — Progress Notes (Addendum)
Subjective: Patient reports intubated and unresponsive. No acute events overnight.   Objective: Vital signs in last 24 hours: Temp:  [98.3 F (36.8 C)-101.6 F (38.7 C)] 100.8 F (38.2 C) (05/31 0403) Pulse Rate:  [65-124] 85 (05/31 0700) Resp:  [13-29] 27 (05/31 0700) BP: (127-186)/(59-102) 149/68 (05/31 0700) SpO2:  [99 %-100 %] 100 % (05/31 0700) FiO2 (%):  [40 %] 40 % (05/31 0403)  Intake/Output from previous day: 05/30 0701 - 05/31 0700 In: 2795.9 [I.V.:1019.9; NG/GT:1130; IV Piggyback:646] Out: 1675 [Urine:1675] Intake/Output this shift: No intake/output data recorded.  Physical Exam: Patient is intubated and unresponsive, not currently on any sedation. No eye opening to noxious stimuli. Not able to follow commands. Pupils minimally reactive, approximately 60mm reactivity bilaterally, impaired bilateral corneals, +c/g, withdrawing on left, unable to get w/d in RLE, minimal in RUE. Incision c/d/i, flap full and somewhat tense   Lab Results: Recent Labs    08/11/20 0408 08/11/20 0425 08/12/20 0405 08/12/20 0409  WBC 17.4*  --   --  16.5*  HGB 7.4*   < > 7.5* 7.4*  HCT 26.8*   < > 22.0* 26.4*  PLT 286  --   --  291   < > = values in this interval not displayed.   BMET Recent Labs    08/11/20 0408 08/11/20 0425 08/12/20 0405 08/12/20 0409  NA 155*   < > 159* 155*  155*  K 3.0*   < > 3.4* 3.4*  CL 126*  --   --  123*  CO2 21*  --   --  25  GLUCOSE 175*  --   --  316*  BUN 10  --   --  10  CREATININE 0.73  --   --  0.66  CALCIUM 8.0*  --   --  7.9*   < > = values in this interval not displayed.    Studies/Results: DG Abd 1 View  Result Date: 08/10/2020 CLINICAL DATA:  NG tube placement. EXAM: ABDOMEN - 1 VIEW COMPARISON:  Aug 08, 2020 FINDINGS: Limited view of the abdomen demonstrates the tip of feeding catheter at the expected location of the gastric antrum. Nonobstructive/nonspecific bowel gas pattern. Contrast opacification of the urinary bladder. No  radio-opaque calculi or other significant radiographic abnormality are seen. IMPRESSION: Tip of feeding catheter at the expected location of the gastric antrum. Electronically Signed   By: Ted Mcalpine M.D.   On: 08/10/2020 11:40   CT HEAD WO CONTRAST  Result Date: 08/12/2020 CLINICAL DATA:  41 year old female status post hemorrhagic right MCA and ACA region infarcts. Dural sinus thrombosis. Craniectomy. EXAM: CT HEAD WITHOUT CONTRAST TECHNIQUE: Contiguous axial images were obtained from the base of the skull through the vertex without intravenous contrast. COMPARISON:  Brain MRI 08/11/2020.  Head CT 08/10/2020 and earlier. FINDINGS: Brain: Extensive hemorrhagic infarction of the right hemisphere, relatively sparing the right deep gray nuclei. Progressed right hemisphere edema since the CT on 08/10/2020, not significantly different from the MRI yesterday. Extension of brain parenchyma through the right side craniectomy is stable. Associated leftward midline shift of 6 mm at the anterior septum pellucidum appears mildly increased. Effaced suprasellar cistern is stable. Other basilar cisterns are preserved. Stable smaller left hemisphere infarcts with occasional hemorrhage from the MRI yesterday (series 3, image 22). Brainstem and cerebellum remain spared. Lateral ventricle mass effect has increased since 08/10/2020, stable since yesterday. Small volume of intraventricular hemorrhage is new since 08/10/2020. No ventriculomegaly. No other extra-axial blood identified. Vascular: Hyperdensity associated  with superior sagittal sinus thrombosis again noted. Skull: Stable right hemi craniectomy. Sinuses/Orbits: Right tympanic cavity and mastoid opacification is stable. Other Visualized paranasal sinuses and mastoids are stable and well aerated. Other: Postoperative changes to the scalp. Stable associated broad-based scalp hematoma. Disconjugate gaze. IMPRESSION: 1. Right greater than left hemorrhagic infarcts are  stable from the MRI yesterday, progressed since the 08/10/2020 CT. 2. A small volume of intraventricular hemorrhage is new. But mass effect on the ventricles is stable with no ventriculomegaly. 3. Intracranial mass effect stable from yesterday, with leftward midline shift at the anterior septum pellucidum up to 6 mm and effaced suprasellar cistern. 4. Superior sagittal sinus thrombosis.  Right hemi craniectomy. Electronically Signed   By: Odessa Fleming M.D.   On: 08/12/2020 05:56   MR BRAIN WO CONTRAST  Result Date: 08/11/2020 CLINICAL DATA:  Stroke follow-up. EXAM: MRI HEAD WITHOUT CONTRAST TECHNIQUE: Multiplanar, multiecho pulse sequences of the brain and surrounding structures were obtained without intravenous contrast. COMPARISON:  CT head Aug 10, 2020. FINDINGS: Brain: Extensive acute infarction throughout the right cerebral convexity, including right MCA and ACA territories.There is exuberant associated cytotoxic edema with areas of hemorrhagic transformation. The extent of hemorrhage and edema may be mildly progressed from the prior CT from Aug 10, 2020, although comparison is difficult across modalities. Prior right craniectomy with brain herniating through the defect. Slight paradoxical rightward midline shift as result (2 mm at the foramen of [place]). Additional smaller infarcts in the left frontal parietal white matter and left occipital cortex. Left parietal acute hemorrhage is likely similar. No hydrocephalus. Mildly prominent retro cerebellar CSF, likely chronic. Vascular: Major arterial flow voids at the skull base are maintained. Please see prior CTA for further arterial evaluation. Additionally, please see recent CT of the and v for evaluation of the dural venous sinuses. Skull and upper cervical spine: Normal marrow signal. Sinuses/Orbits: Mild mucosal thickening. No acute orbital abnormality. Other: Large right mastoid effusion. IMPRESSION: 1. Extensive acute infarction throughout the right cerebral  convexity, including right MCA and ACA territories. Exuberant associated edema with areas of hemorrhagic transformation. The extent of hemorrhage and edema may be mildly progressed from the recent CT from Aug 10, 2020, although comparison is difficult across modalities. Repeat CT could along more direct comparison if clinically indicated. 2. Prior right craniectomy with brain herniating through the defect. Slight paradoxical rightward midline shift as result (2 mm at the foramen of [place]). 3. Additional smaller infarcts in the left frontal parietal white matter and left occipital cortex. 4. Left parietal acute hemorrhage is likely similar. 5. Large right mastoid effusion. 6. Please see recent CTA and CTV for vascular findings. Electronically Signed   By: Regina Harts MD   On: 08/11/2020 14:39   DG CHEST PORT 1 VIEW  Result Date: 08/10/2020 CLINICAL DATA:  Intracranial hemorrhage. EXAM: PORTABLE CHEST 1 VIEW COMPARISON:  June 07, 2020 FINDINGS: Endotracheal tube in satisfactory position. Enteric catheter with tip overlying the expected location of the gastric antrum. Cardiomediastinal silhouette is normal. Mediastinal contours appear intact. There is no evidence of focal airspace consolidation, pleural effusion or pneumothorax. Osseous structures are without acute abnormality. Soft tissues are grossly normal. IMPRESSION: No active disease. Electronically Signed   By: Ted Mcalpine M.D.   On: 08/10/2020 11:41   DG Abd Portable 1V  Result Date: 08/11/2020 CLINICAL DATA:  40 year old female status post orogastric tube placement. EXAM: PORTABLE ABDOMEN - 1 VIEW COMPARISON:  Abdominal radiograph 08/10/2020. FINDINGS: Enteric tube noted with tip and  side port in the body of the stomach. The bowel gas pattern is normal. No radio-opaque calculi or other significant radiographic abnormality are seen. IMPRESSION: 1. Enteric tube positioned with tip and side port in the body of the stomach. 2.  Nonobstructive bowel gas pattern. Electronically Signed   By: Trudie Reed M.D.   On: 08/11/2020 12:53   DG Abd Portable 1V  Result Date: 08/10/2020 CLINICAL DATA:  40 year old female status post enteric tube placement. EXAM: PORTABLE ABDOMEN - 1 VIEW COMPARISON:  Earlier radiograph dated 08/10/2020. FINDINGS: The previously seen feeding tube has been removed and not visualized on this radiograph. No bowel dilatation or evidence of obstruction. Cutaneous surgical clips noted over the right lower quadrant. The osseous structures are intact. IMPRESSION: Interval removal of the feeding tube. No bowel dilatation. Electronically Signed   By: Elgie Collard M.D.   On: 08/10/2020 17:51   DG Abd Portable 1V  Result Date: 08/10/2020 CLINICAL DATA:  40 year old female status post feeding tube placement. EXAM: PORTABLE ABDOMEN - 1 VIEW COMPARISON:  Radiograph dated 05/27 FINDINGS: Feeding tube with tip just distal to the GE junction. Recommend further advancing. No bowel dilatation. The osseous structures and soft tissues are unremarkable. IMPRESSION: Feeding tube with tip just distal to the GE junction. Recommend further advancing. Electronically Signed   By: Elgie Collard M.D.   On: 08/10/2020 15:28   EEG adult  Result Date: 08/11/2020 Charlsie Quest, MD     08/11/2020  3:49 PM Patient Name: Regina Morris MRN: [id number] Epilepsy Attending: Charlsie Quest Referring Physician/Provider: Dr Marvel Plan Date: 08/11/2020 Duration: 24.17 mins  Patient history: 40 year old female with right MCA infarct with hemorrhagic transformation with worsening mental status.  EEG to evaluate for seizures.  Level of alertness:  Comatose  AEDs during EEG study: Keppra  Technical aspects: This EEG study was done with scalp electrodes positioned according to the 10-20 International system of electrode placement. Electrical activity was acquired at a sampling rate of 500Hz  and reviewed with a high frequency filter of  70Hz  and a low frequency filter of 1Hz . EEG data were recorded continuously and digitally stored.  Description: EEG showed continuous generalized 3 to 5 Hz theta-delta slowing. Run of sharp waves was noted in right frontocentral region without definite evolution suggestive of brief ictal-interictal rhythmic discharges.  Hyperventilation and photic stimulation were not performed.    ABNORMALITY -Brief ictal-interictal rhythmic discharges, right frontocentral region -Continuous slow, generalized  IMPRESSION: This study showed evidence of epileptogenicity arising from right frontocentral region with high potential for seizures.  Additionally there is severe diffuse encephalopathy, nonspecific etiology. No definite seizures  were seen throughout the recording.     Assessment/Plan: 40 y.o. 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 Her exam and prognosis continue to be poor. No new neurosurgical recommendations at this time. Continue supportive care    LOS: 5 days     , DNP, NP-C 08/12/2020, 8:01 AM  Continuing supportive care, but prognosis is poor.

## 2020-08-12 NOTE — Progress Notes (Signed)
ANTICOAGULATION CONSULT NOTE  Pharmacy Consult:  Heparin Indication:  Cerebral venous sinus thrombosis  Labs: Recent Labs    08/10/20 0619 08/10/20 1632 08/11/20 0408 08/11/20 0425 08/11/20 0652 08/11/20 1956 08/12/20 0405 08/12/20 0409  HGB 6.2*   < > 7.4*   < > 7.7*  --  7.5* 7.4*  HCT 23.8*   < > 26.8*   < > 27.3*  --  22.0* 26.4*  PLT 400  --  286  --   --   --   --  291  HEPARINUNFRC  --   --   --   --   --  <0.10*  --  0.12*  CREATININE 0.70  --  0.73  --   --   --   --  0.66   < > = values in this interval not displayed.    Assessment: 18 YOF presented with nausea, vomiting and right temple headache, found to have hemorrhagic R MCA infarct with midline shift and mass effect.  Most recent CT on 5/29 showed progressive edema with newly seen subcortical hemorrhage in the left frontal region.  Subsequent venogram positive for dural venous sinus thrombosis.  Pharmacy consulted to dose IV heparin.  Heparin level this am 0.12 units/ml  Hg down to 7.4.  Scant bleeding noted this am.  Pt in CT now Goal of Therapy:  Heparin level 0.3-0.5 units/ml Monitor platelets by anticoagulation protocol: Yes   Plan:  Increase Heparin infusion to 1150units/hr - no bolus Check 6 hr heparin level Daily heparin level and CBC  Talbert Cage, PharmD Clinical Pharmacist 08/12/2020 5:12 AM

## 2020-08-12 NOTE — Progress Notes (Signed)
NAME:  Regina Morris, MRN:  [id number], DOB:  December 06, 1980, LOS: 5 ADMISSION DATE:  07/27/2020, CONSULTATION DATE:  08/12/2020 REFERRING MD:  Stroke, Md, MD, CHIEF COMPLAINT:  Respiratory failure, tsroke  History of Present Illness:  Regina Morris is a 40 y.o. woman who [facility] with headache nausea and [facility]. 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 [facility] Started on keppra. Mental status worsened 5/27 evening and the morning of 5/28 CT head showed worsening midline shift. She was taken to the OR for decompressive hemicraniotomy with a pupillar exam concerning for impending herniation. She had rsection of non-viable brain tissue during hemicraniotomy and a bone flap was placed when she was closed.   History is obtained from chart review as the patient is intubated and not able to provide response.   Pertinent  Medical History  Gestational diabetes Nephrolithiasis  Significant Hospital Events: Including procedures, antibiotic start and stop dates in addition to other pertinent events   . [facility], [facility], vomiting . [facility], started on keppra and HTS . 5/27 possible neurologic decline, repeat CT head shows stability of bleeding, but increase in edema . 5/27 EEG epileptogenecity in the right frontal region and cortical dysfunction of the right hemisphere, no seizures . 5/27 cortrak placed . 5/28 overnight worsening midline shift and change in neuro exam, plan for decompressive hemicraniotomy . 5/30 No neuro change.  MRI.  Anticoagulation restarted.  PMT consulted.  Sedation stopped.   Interim History / Subjective:  RN reports some weird flexion movements in upper extremities Remains on heparin gtt and NS at 30 ml/hr UOP 1.6 L +1.1L/ net +5.6L tmax 101.6 Weaning on PSV since 5/30  afternoon  Objective   Blood pressure (!) 149/68, pulse 85, temperature (!) 100.8 F (38.2 C), resp. rate (!) 27, height 5\' 6"  (1.676 m), weight 80.3 kg, last menstrual period 08/05/2020, SpO2 100 %.    Vent Mode: CPAP;PSV FiO2 (%):  [40 %] 40 % Set Rate:  [16 bmp] 16 bmp Vt Set:  [350 mL] 350 mL PEEP:  [5 cmH20] 5 cmH20 Pressure Support:  [10 cmH20-12 cmH20] 10 cmH20   Intake/Output Summary (Last 24 hours) at 08/12/2020 0729 Last data filed at 08/12/2020 0700 Gross per 24 hour  Intake 2795.88 ml  Output 1675 ml  Net 1120.88 ml   Filed Weights   10-07-20 0500 10-07-20 1216 08/11/20 0423  Weight: 71 kg 71 kg 80.3 kg    Examination: No sedation since 5/30 ~1800 General:  Critically ill young female in NAD on MV HEENT: MM pink/moist, ETT/ OGT- with bite block, currently clenched, pupils 5/ reactive, downward left gaze, slight right corneal, absent left, crani dressing wnl- no drainage, tense, some edema to left facial/ eye area Neuro: does not open eyes or f/c, ? Flicker of withdrawal vs triple flex in lower extremities to noxious stimuli, will increase HR/ RR/ BP with stimulation CV: rr, no murmur PULM:  Non labored, breathing PSV 10/5 comfortably, coarse, +cough, minimal secretions GI: soft, bs+, ND, purwick, crani flap site wnl  Extremities: warm/dry, generalized +1 edema  Skin: no rashes    Labs/imaging that I havepersonally reviewed  (right click and "Reselect all SmartList Selections" daily)   5/30 MRI brain >>  1. Extensive acute infarction throughout the right cerebral convexity, including  right MCA and ACA territories. Exuberant associated edema with areas of hemorrhagic transformation. The extent of hemorrhage and edema may be mildly progressed from the recent CT from Aug 10, 2020, although comparison is difficult across modalities. Repeat CT could along more direct comparison if clinically indicated. 2. Prior right craniectomy with brain herniating through the defect.   Slight paradoxical rightward midline shift as result (2 mm at the foramen of [place]). 3. Additional smaller infarcts in the left frontal parietal white matter and left occipital cortex. 4. Left parietal acute hemorrhage is likely similar. 5. Large right mastoid effusion. 6. Please see recent CTA and CTV for vascular findings.  [facility] 5/31 >> 1. Right greater than left hemorrhagic infarcts are stable from the MRI yesterday, progressed since the 08/10/2020 CT. 2. A small volume of intraventricular hemorrhage is new. But mass effect on the ventricles is stable with no ventriculomegaly. 3. Intracranial mass effect stable from yesterday, with leftward midline shift at the anterior septum pellucidum up to 6 mm and effaced suprasellar cistern. 4. Superior sagittal sinus thrombosis.  Right hemi craniectomy.  CBC, BMET Glucose trend remains > 200  Resolved Hospital Problem list     Assessment & Plan:   Acute right MCA territory hemorrhage with edema, midline shift, hemorrhagic conversion Right decompressive hemicraniotomy with bone flap Acute hypoxemic respiratory failure Dural venous sinus thrombus Acute blood loss anemia Hypernatremia Hypokalemia  Hyperglycemia   Plan: - CTH/ MRI as above - Neurology/ NSGY following - AEDs per Neurology, keppra  - continue heparin gtt per pharmacy  - continue full MV support, doing well on PSV but mental status precludes extubation, likely trach this week - VAP prevention protocol/ PPI - PAD protocol for sedation> not needed currently, last dose fentanyl 5/30 ~ 1830 - wean FiO2 as able for SpO2 >92%  - trending BMET/ CBC- remains stable - s/p kcl supplement, check mag (goal > 2) - continue SSI moderate, add levemir 10 units BID, consider adding TF coverage - Na remains within goal 155-160 - prognosis remains poor.  Appreciate PMT input and assistance with goals of care.  - tylenol prn fevers- likely due to ICH, no change in secretions - monitor off  abx clinically, follow cultures    Best practice (right click and "Reselect all SmartList Selections" daily)  Diet:  Tube Feed  Pain/Anxiety/Delirium protocol (if indicated): Yes (RASS goal 0) VAP protocol (if indicated): Yes DVT prophylaxis: Systemic AC GI prophylaxis: H2B Glucose control:  SSI Yes Central venous access:  N/A Arterial line:  N/A Foley:  N/A Mobility:  bed rest  PT consulted: N/A Last date of multidisciplinary goals of care discussion ongoing Code Status:  full code Disposition: ICU  Pending family update 5/31  Labs   CBC: Recent Labs  Lab 08/08/20 0253 07/17/2020 0250 07/13/2020 1533 08/10/20 5643 08/10/20 1632 08/11/20 0408 08/11/20 0425 08/11/20 0652 08/12/20 0405 08/12/20 0409  WBC 17.6* 16.4*  --  21.3*  --  17.4*  --   --   --  16.5*  HGB 8.5* 8.6*   < > 6.2*   < > 7.4* 6.8* 7.7* 7.5* 7.4*  HCT 32.5* 32.2*   < > 23.8*   < > 26.8* 20.0* 27.3* 22.0* 26.4*  MCV 61.7* 62.0*  --  63.1*  --  67.0*  --   --   --  66.5*  PLT 549* 524*  --  400  --  286  --   --   --  291   < > =  values in this interval not displayed.    Basic Metabolic Panel: Recent Labs  Lab 08/08/20 0253 08/08/20 0939 Sep 03, 2020 0250 09-03-2020 0914 08/10/20 4580 08/10/20 1031 08/10/20 1632 08/11/20 0408 08/11/20 0425 08/11/20 0856 08/11/20 1418 08/11/20 1956 08/12/20 0225 08/12/20 0405 08/12/20 0409  NA 143   < > 154*   < > 160*  --    < > 155* 158* 157* 153* 151* 154* 159* 155*  155*  K 3.6  --  4.2   < > 2.6*  --   --  3.0* 3.0*  --   --   --   --  3.4* 3.4*  CL 113*  --  119*  --  >130*  --   --  126*  --   --   --   --   --   --  123*  CO2 20*  --  21*  --  23  --   --  21*  --   --   --   --   --   --  25  GLUCOSE 194*  --  182*  --  125*  --   --  175*  --   --   --   --   --   --  316*  BUN 10  --  5*  --  9  --   --  10  --   --   --   --   --   --  10  CREATININE 0.53  --  0.51  --  0.70  --   --  0.73  --   --   --   --   --   --  0.66  CALCIUM 8.2*  --  8.9  --   8.0*  --   --  8.0*  --   --   --   --   --   --  7.9*  MG 2.0  --   --   --   --  1.8  --   --   --   --   --   --   --   --   --   PHOS 1.9*  --   --   --   --   --   --   --   --  2.2*  --   --   --   --   --    < > = values in this interval not displayed.   GFR: Estimated Creatinine Clearance: 101.9 mL/min (by C-G formula based on SCr of 0.66 mg/dL). Recent Labs  Lab 08/06/20 2048 08/08/20 0253 2020-09-03 0250 08/10/20 0619 08/11/20 0408 08/12/20 0409  PROCALCITON <0.10  --   --   --   --   --   WBC 20.5*   < > 16.4* 21.3* 17.4* 16.5*   < > = values in this interval not displayed.    Liver Function Tests: Recent Labs  Lab 08/06/20 2048  AST 16  ALT 13  ALKPHOS 89  BILITOT 1.1  PROT 7.7  ALBUMIN 3.5   Recent Labs  Lab 08/06/20 2048  LIPASE 21   No results for input(s): AMMONIA in the last 168 hours.  ABG    Component Value Date/Time   PHART 7.491 (H) 08/12/2020 0405   PCO2ART 35.6 08/12/2020 0405   PO2ART 182 (H) 08/12/2020 0405   HCO3 26.9 08/12/2020 0405   TCO2 28 08/12/2020 0405   ACIDBASEDEF 1.0  08/11/2020 0425   O2SAT 100.0 08/12/2020 0405     Coagulation Profile: Recent Labs  Lab 07/24/2020 0410  INR 1.1    Cardiac Enzymes: No results for input(s): CKTOTAL, CKMB, CKMBINDEX, TROPONINI in the last 168 hours.  HbA1C: Hgb A1c MFr Bld  Date/Time Value Ref Range Status  07/24/2020 07:55 AM 12.9 (H) 4.8 - 5.6 % Final    Comment:    (NOTE)         Prediabetes: 5.7 - 6.4         Diabetes: >6.4         Glycemic control for adults with diabetes: <7.0   12/12/2019 09:57 PM 11.7 (H) 4.8 - 5.6 % Final    Comment:    (NOTE) Pre diabetes:          5.7%-6.4%  Diabetes:              >6.4%  Glycemic control for   <7.0% adults with diabetes    CCT: 35 mins   Posey Boyer, ACNP [facility] 08/12/2020, 7:29 AM

## 2020-08-12 NOTE — Progress Notes (Signed)
K+ 3.4 Replaced per protocol  

## 2020-08-13 ENCOUNTER — Inpatient Hospital Stay (HOSPITAL_COMMUNITY): Payer: Self-pay

## 2020-08-13 LAB — BASIC METABOLIC PANEL
Anion gap: 9 (ref 5–15)
BUN: 13 mg/dL (ref 6–20)
CO2: 27 mmol/L (ref 22–32)
Calcium: 8.2 mg/dL — ABNORMAL LOW (ref 8.9–10.3)
Chloride: 119 mmol/L — ABNORMAL HIGH (ref 98–111)
Creatinine, Ser: 0.67 mg/dL (ref 0.44–1.00)
GFR, Estimated: >60 mL/min (ref >60)
Glucose, Bld: 288 mg/dL — ABNORMAL HIGH (ref 70–99)
Potassium: 3.3 mmol/L — ABNORMAL LOW (ref 3.5–5.1)
Sodium: 155 mmol/L — ABNORMAL HIGH (ref 135–145)

## 2020-08-13 LAB — URINALYSIS, ROUTINE W REFLEX MICROSCOPIC
Bilirubin Urine: NEGATIVE
Glucose, UA: 50 mg/dL — AB
Ketones, ur: NEGATIVE mg/dL
Nitrite: NEGATIVE
Protein, ur: NEGATIVE mg/dL
Specific Gravity, Urine: 1.004 — ABNORMAL LOW (ref 1.005–1.030)
pH: 5 (ref 5.0–8.0)

## 2020-08-13 LAB — CBC
HCT: 25.6 % — ABNORMAL LOW (ref 36.0–46.0)
Hemoglobin: 7.2 g/dL — ABNORMAL LOW (ref 12.0–15.0)
MCH: 18.7 pg — ABNORMAL LOW (ref 26.0–34.0)
MCHC: 28.1 g/dL — ABNORMAL LOW (ref 30.0–36.0)
MCV: 66.5 fL — ABNORMAL LOW (ref 80.0–100.0)
Platelets: 257 10*3/uL (ref 150–400)
RBC: 3.85 MIL/uL — ABNORMAL LOW (ref 3.87–5.11)
RDW: 24.2 % — ABNORMAL HIGH (ref 11.5–15.5)
WBC: 13.8 10*3/uL — ABNORMAL HIGH (ref 4.0–10.5)
nRBC: 0 % (ref 0.0–0.2)

## 2020-08-13 LAB — GLUCOSE, CAPILLARY
Glucose-Capillary: 149 mg/dL — ABNORMAL HIGH (ref 70–99)
Glucose-Capillary: 149 mg/dL — ABNORMAL HIGH (ref 70–99)
Glucose-Capillary: 208 mg/dL — ABNORMAL HIGH (ref 70–99)
Glucose-Capillary: 212 mg/dL — ABNORMAL HIGH (ref 70–99)
Glucose-Capillary: 254 mg/dL — ABNORMAL HIGH (ref 70–99)
Glucose-Capillary: 256 mg/dL — ABNORMAL HIGH (ref 70–99)

## 2020-08-13 LAB — HEPARIN LEVEL (UNFRACTIONATED)
Heparin Unfractionated: 0.4 IU/mL (ref 0.30–0.70)
Heparin Unfractionated: 0.39 IU/mL (ref 0.30–0.70)

## 2020-08-13 LAB — OSMOLALITY, URINE: Osmolality, Ur: 136 mOsm/kg — ABNORMAL LOW (ref 300–900)

## 2020-08-13 LAB — SODIUM
Sodium: 158 mmol/L — ABNORMAL HIGH (ref 135–145)
Sodium: 167 mmol/L (ref 135–145)
Sodium: 171 mmol/L (ref 135–145)

## 2020-08-13 IMAGING — CT CT HEAD W/O CM
5 of 8 series · 15 of 47 positions shown, 16 images · non-contrast
Comparison: [DATE] head CT and earlier.

CLINICAL DATA: 39-year-old female with hemorrhagic right
hemispheric infarcts, dural sinus thrombosis, craniectomy.

EXAM:
CT HEAD WITHOUT CONTRAST
TECHNIQUE: Contiguous axial images were obtained from the base of the skull
through the vertex without intravenous contrast.

[Series 3: head without · axial · non-contrast · 0.44mm/px · z∈[-112,-68]mm · 2 of 27 slices shown, 3 images (1 of 2)]
[im 9/27  brain]
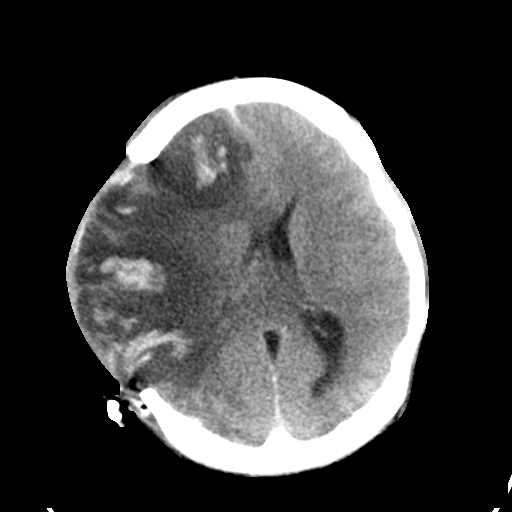
[im 9/27  bone]
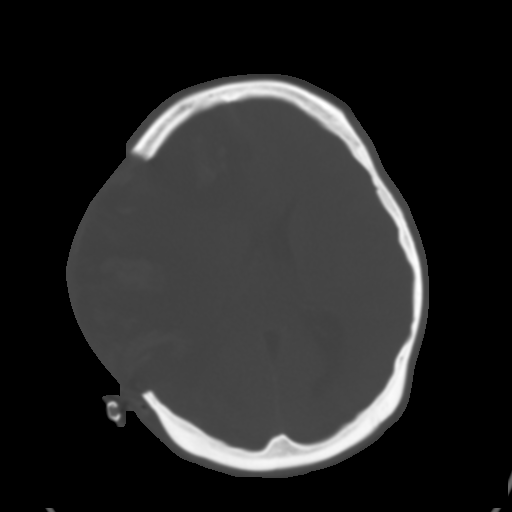
[im 18/27  brain]
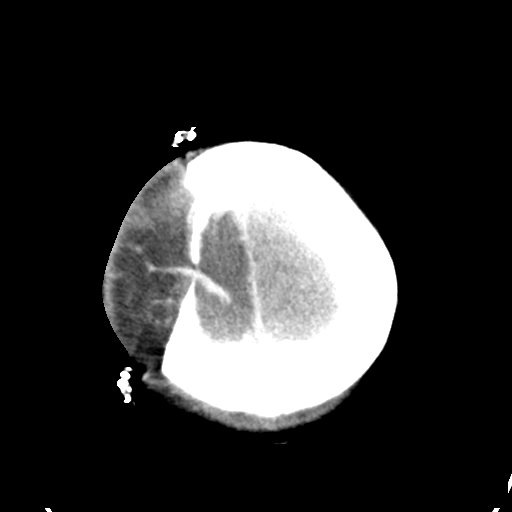

[Series 4: head bone · axial · 0.44mm/px · z∈[-140,-80]mm · 5 of 67 slices shown]
[im 7/67  bone]
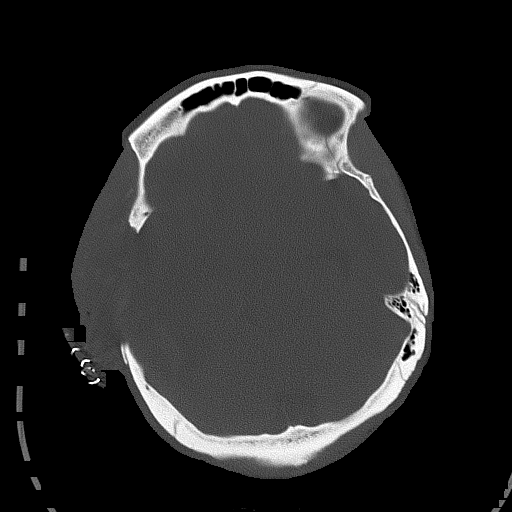
[im 13/67  bone]
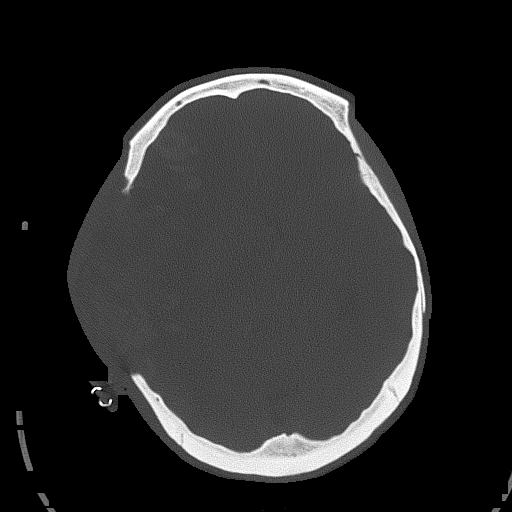
[im 25/67  bone]
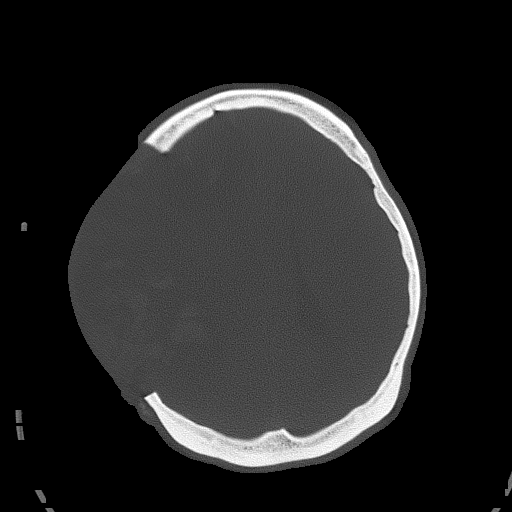
[im 31/67  bone]
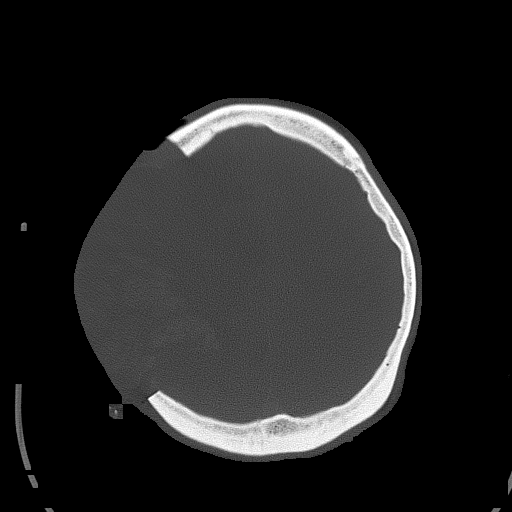
[im 37/67  bone]
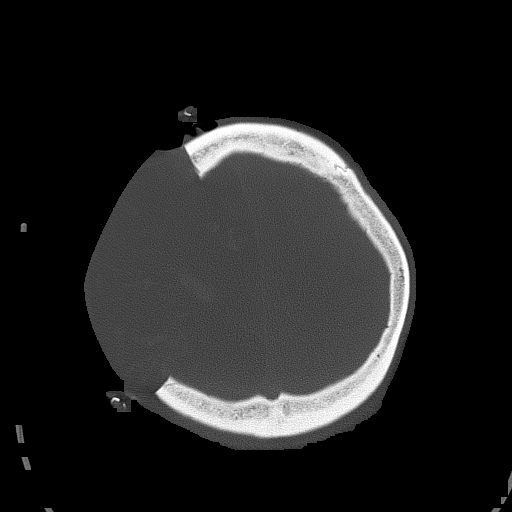

[Series 5: head without cor · coronal · non-contrast · 0.26mm/px · 3 of 67 slices shown]
[im 17/67  brain]
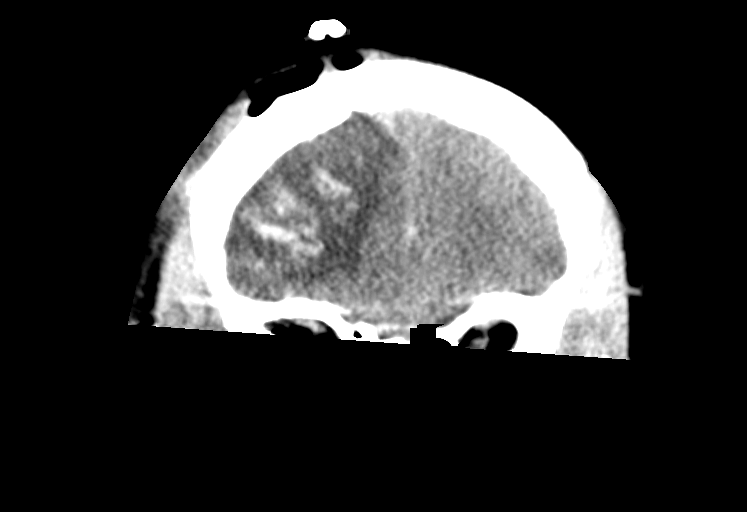
[im 34/67  brain]
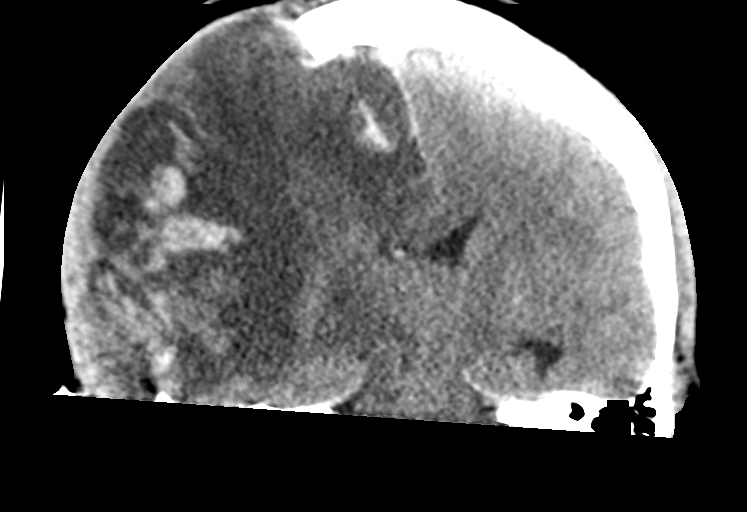
[im 50/67  brain]
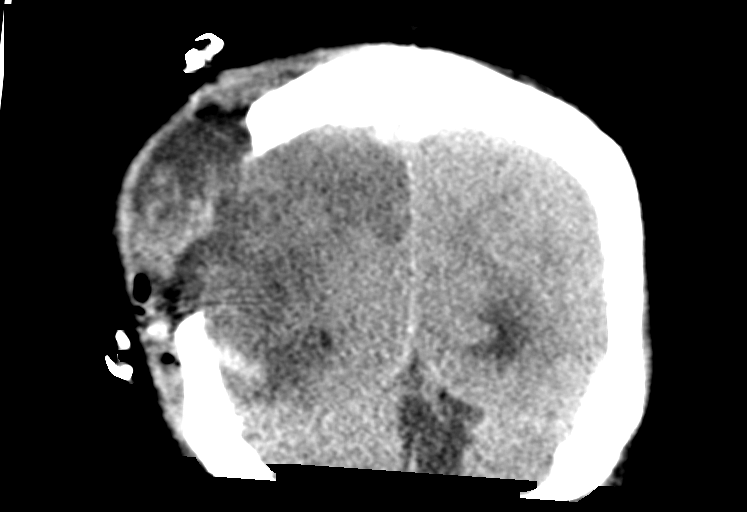

[Series 7: head without · axial · non-contrast · 0.44mm/px · z∈[-148,-112]mm · 2 of 22 slices shown (2 of 2)]
[im 8/22  brain]
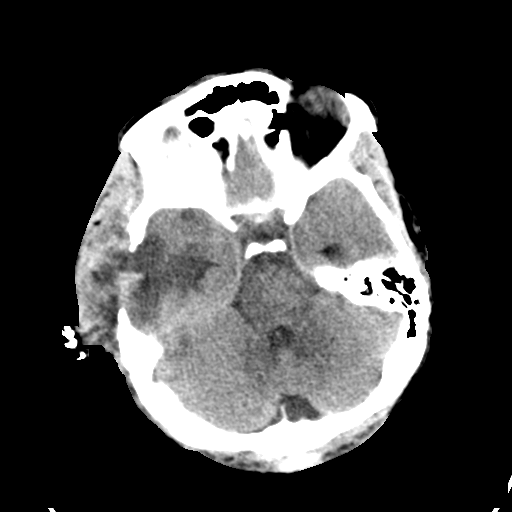
[im 15/22  brain]
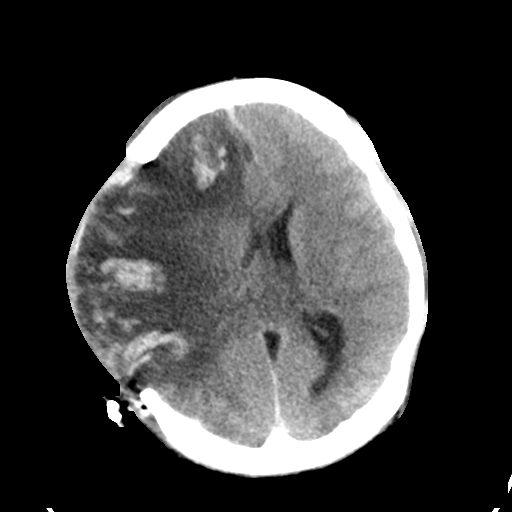

[Series 10: head without sag · sagittal · non-contrast · 0.33mm/px · 3 of 66 slices shown]
[im 27/66  brain]
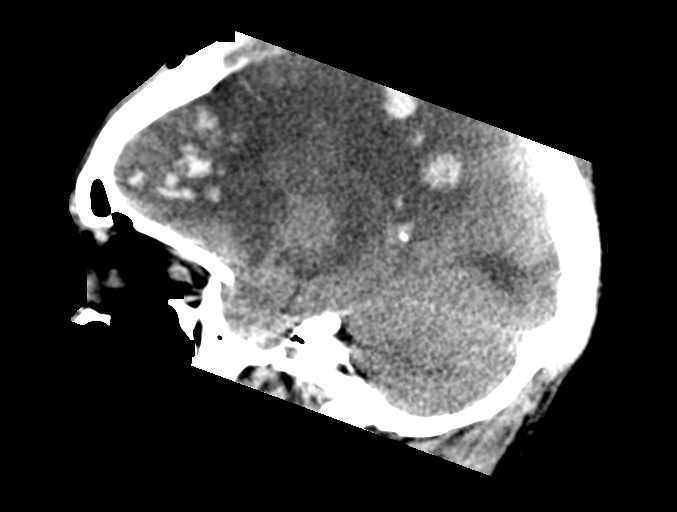
[im 40/66  brain]
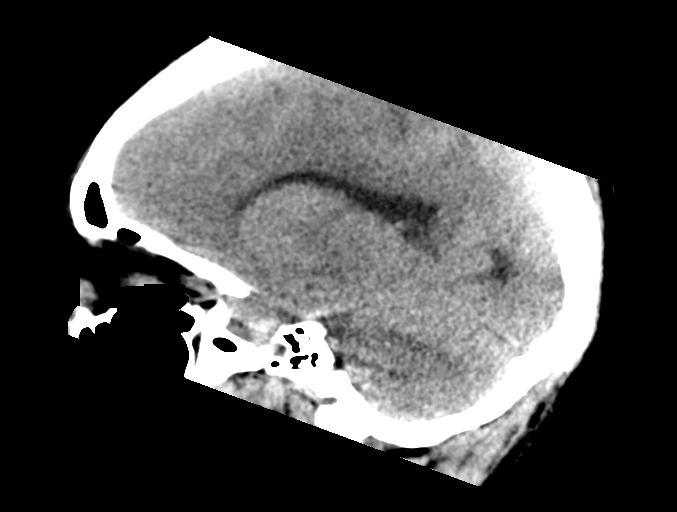
[im 53/66  brain]
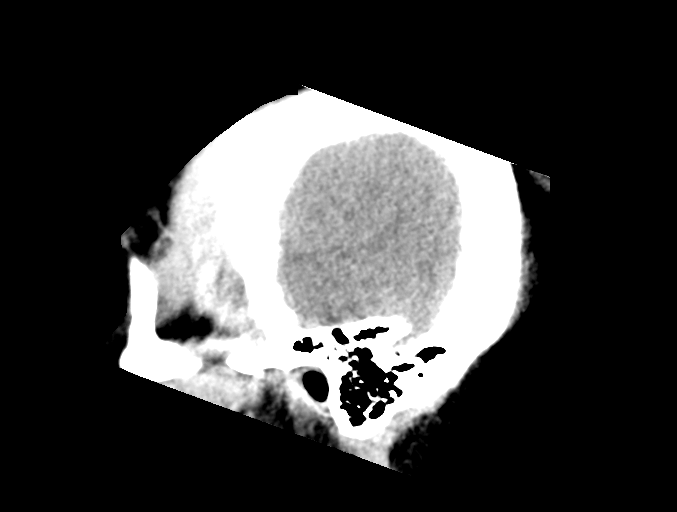

[15 of 47 positions shown; findings below may reference images not displayed]

FINDINGS: Brain: Confluent right ACA and MCA infarcts with malignant
hemorrhagic transformation and abundant cytotoxic edema. Stable
extent of brain herniation through the craniectomy.

Leftward midline shift at the anterior septum pellucidum remains
stable at 6 to 7 mm (series 3, image 8). Improved patency of the
suprasellar cistern since yesterday. Remaining basilar cisterns are
stable. Incidental mega cisterna magna, normal variant.

Stable mass effect on the right lateral ventricle. No trapped or
enlarged ventricles. Small volume of IVH is stable since yesterday,
including in the 3rd and 4th ventricles.

Smaller hemorrhagic infarcts in the left frontal lobe are stable
(series 3, image 14) with no significant mass effect.

Stable gray-white matter differentiation elsewhere.

Vascular: Stable.

Skull: Stable.  Right craniectomy.

Sinuses/Orbits: Stable right middle ear and mastoid opacification.
Other Visualized paranasal sinuses and mastoids are stable and well
aerated.

Other: Stable postoperative changes to the scalp.  Negative orbits.
IMPRESSION: 1. Stable head CT from yesterday, aside from slightly improved
patency of the suprasellar cistern. Leftward midline shift is stable
at 6 to 7 mm.
- right side craniectomy following large hemorrhagic infarcts of the
right ACA and MCA territories. Stable blood products and edema.
Smaller hemorrhagic left frontal lobe infarcts are stable without
mass effect.
- stable small volume IVH without ventriculomegaly.

2. No new intracranial abnormality.

## 2020-08-13 IMAGING — CT CT HEAD W/O CM
3 of 4 series · 13 of 47 positions shown, 15 images · non-contrast
Comparison: CT head [DATE]
COMPARISON: CT head [DATE]

Addendum:
CLINICAL DATA: Mental status change.  Stroke.

EXAM:
CT HEAD WITHOUT CONTRAST
TECHNIQUE: Contiguous axial images were obtained from the base of the skull
through the vertex without intravenous contrast.

[Series 3: head wo · axial · 0.45mm/px · z∈[-139,-24]mm · 7 of 31 slices shown, 9 images]
[im 4/31  brain]
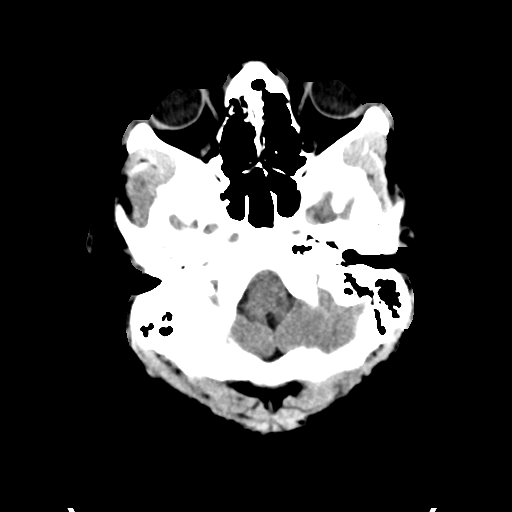
[im 4/31  bone]
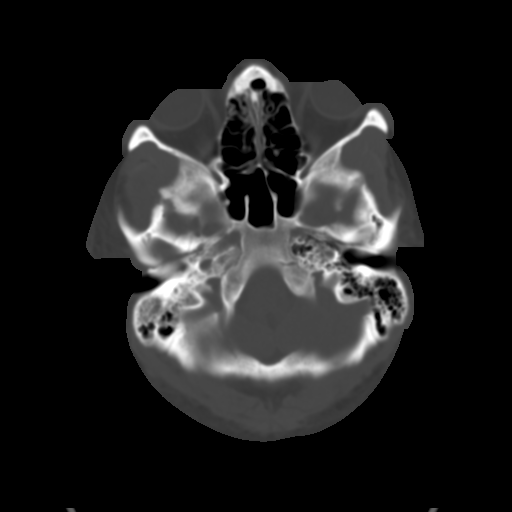
[im 8/31  brain]
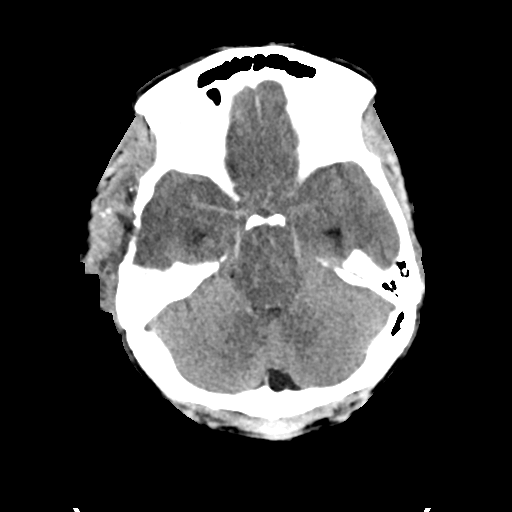
[im 12/31  brain]
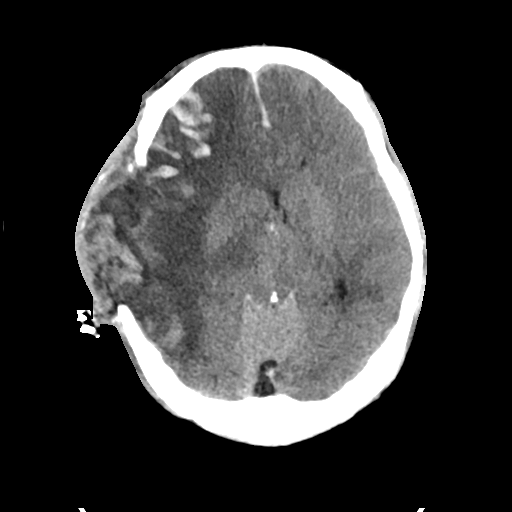
[im 16/31  brain]
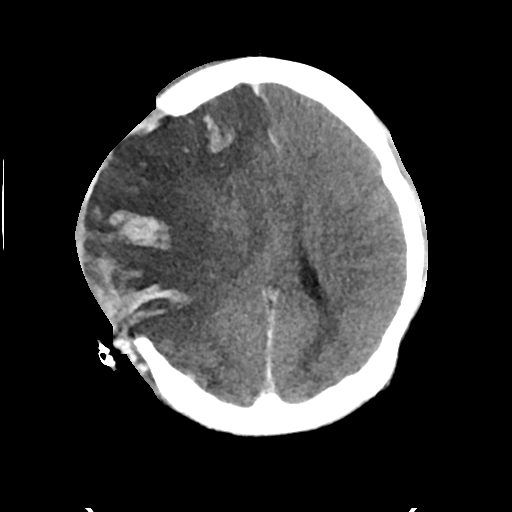
[im 19/31  brain]
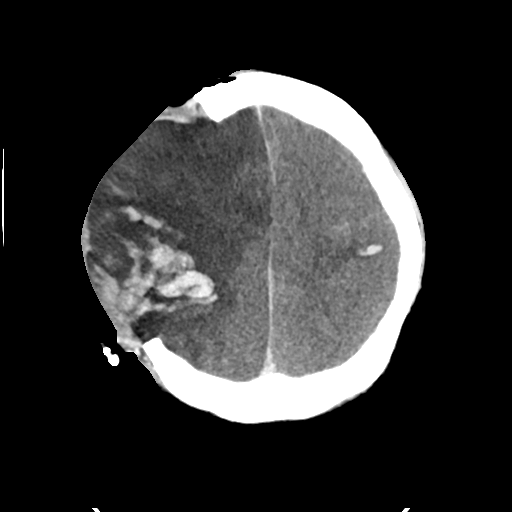
[im 19/31  bone]
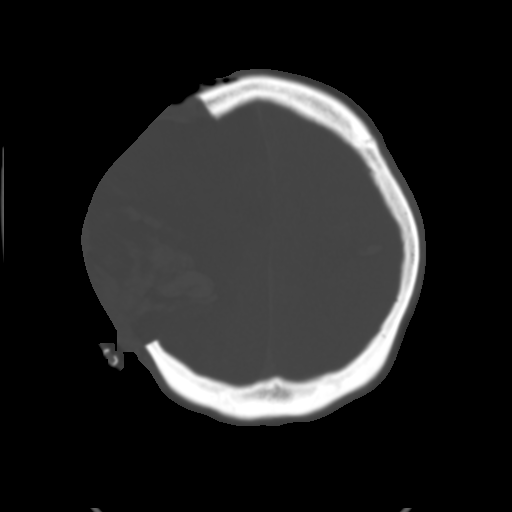
[im 23/31  brain]
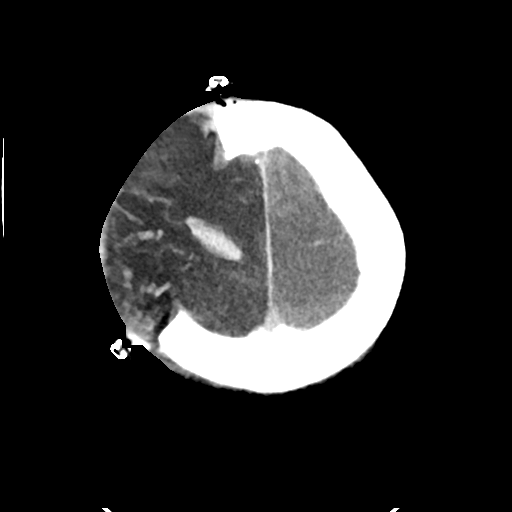
[im 27/31  brain]
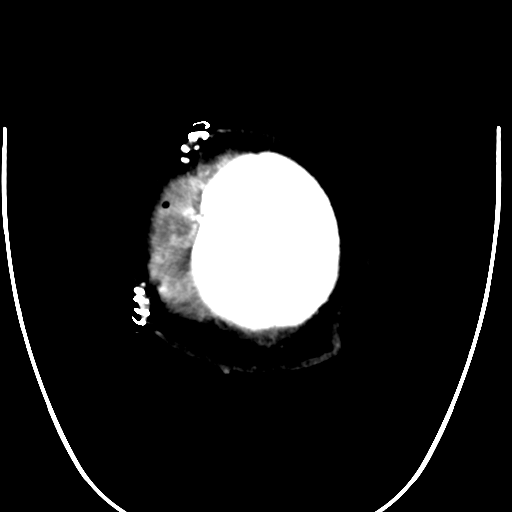

[Series 5: cor soft · coronal · 0.32mm/px · 3 of 64 slices shown]
[im 22/64  brain]
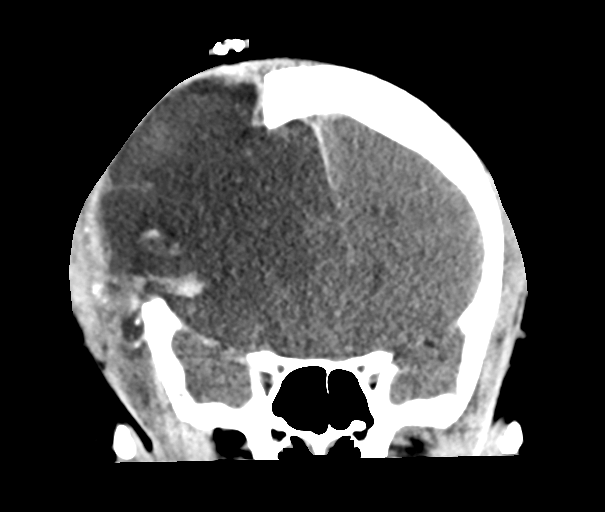
[im 29/64  brain]
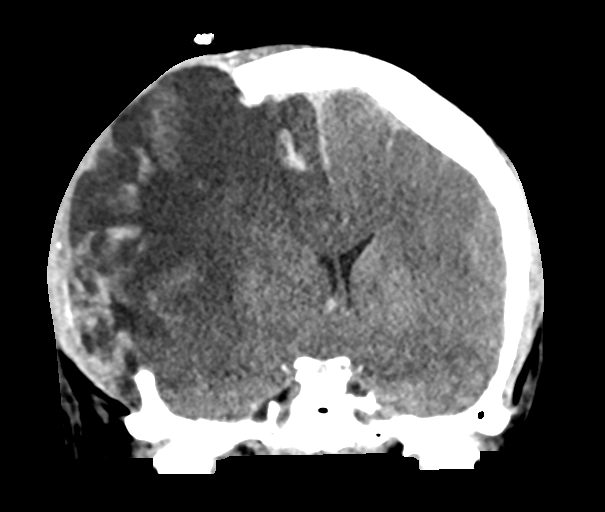
[im 36/64  brain]
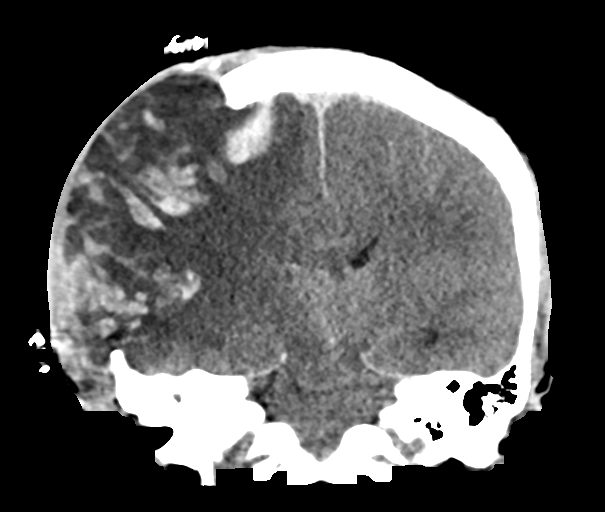

[Series 6: sag soft · sagittal · 0.32mm/px · 3 of 62 slices shown]
[im 21/62  brain]
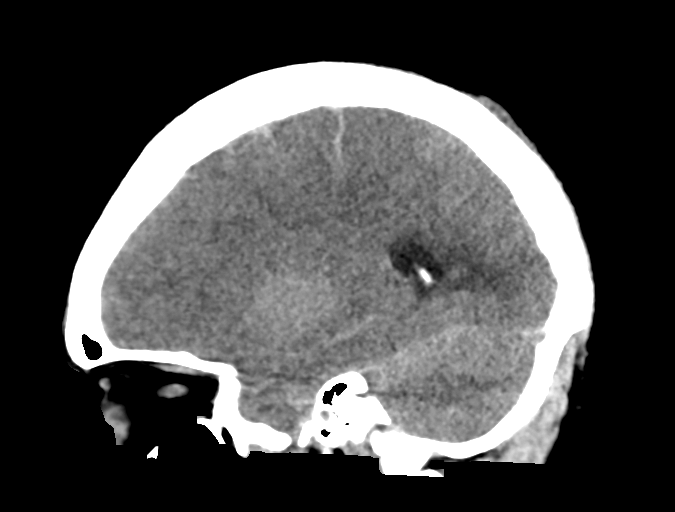
[im 31/62  brain]
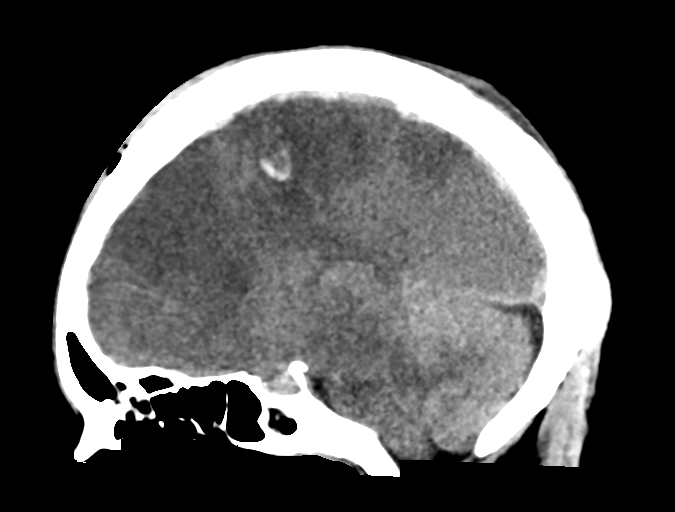
[im 41/62  brain]
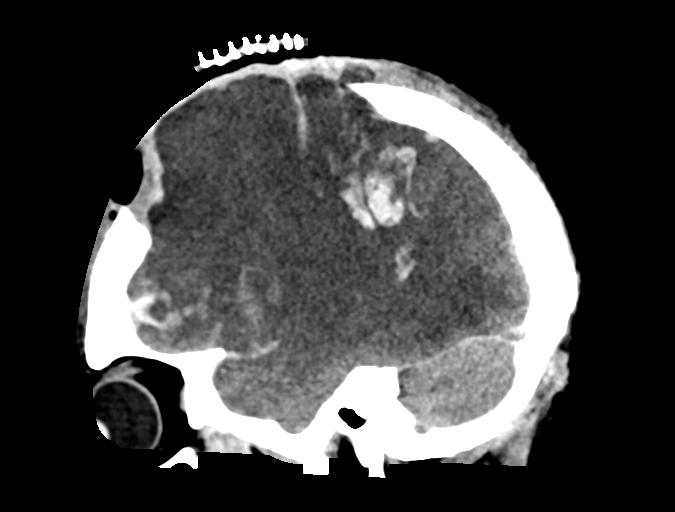

[13 of 47 positions shown; findings below may reference images not displayed]

FINDINGS: Brain: Increased intracranial pressure with further effacement of
the basilar cisterns compared to the prior study. There is further
compression of the lateral ventricles. There appears to be
progressive edema in the left frontal and temporal lobe compared to
the prior study. 9 mm midline shift to the left slightly increased.
There is progressive edema in the midbrain and right thalamus.

Large hemorrhagic right hemispheric infarct is similar to the prior
study. There has been a right craniectomy with considerable brain
swelling through the craniectomy defect.

Small area of hypodense infarct in the left occipital lobe
unchanged. Small hemorrhagic infarct in the left frontal parietal
lobe is unchanged. Mild subarachnoid hemorrhage on the left over the
convexity. Small amount of blood in the third ventricle is
unchanged.

Vascular: Negative for hyperdense vessel

Skull: Large right-sided craniectomy unchanged.

Sinuses/Orbits: Negative

Other: None
IMPRESSION: Increased intracranial pressure has progressed in the interval.
Progressive transtentorial herniation. There is further effacement
of the basilar cisterns. Increased edema in the left hemisphere and
in the midbrain compared to earlier today.

Large hemorrhagic right hemispheric infarct unchanged. 9 mm midline
shift to the left slightly increased.

ADDENDUM:
These results were called by telephone at the time of interpretation
on [DATE] at [DATE] to provider SYDNOR, who verbally acknowledged
these results.

*** End of Addendum ***
FINDINGS: Brain: Increased intracranial pressure with further effacement of
the basilar cisterns compared to the prior study. There is further
compression of the lateral ventricles. There appears to be
progressive edema in the left frontal and temporal lobe compared to
the prior study. 9 mm midline shift to the left slightly increased.
There is progressive edema in the midbrain and right thalamus.

Large hemorrhagic right hemispheric infarct is similar to the prior
study. There has been a right craniectomy with considerable brain
swelling through the craniectomy defect.

Small area of hypodense infarct in the left occipital lobe
unchanged. Small hemorrhagic infarct in the left frontal parietal
lobe is unchanged. Mild subarachnoid hemorrhage on the left over the
convexity. Small amount of blood in the third ventricle is
unchanged.

Vascular: Negative for hyperdense vessel

Skull: Large right-sided craniectomy unchanged.

Sinuses/Orbits: Negative

Other: None
IMPRESSION: Increased intracranial pressure has progressed in the interval.
Progressive transtentorial herniation. There is further effacement
of the basilar cisterns. Increased edema in the left hemisphere and
in the midbrain compared to earlier today.

Large hemorrhagic right hemispheric infarct unchanged. 9 mm midline
shift to the left slightly increased.

## 2020-08-13 IMAGING — DX DG ABD PORTABLE 1V
1 series · 1 of 1 positions shown · non-contrast
Comparison: [DATE]

CLINICAL DATA: Check feeding catheter placement

EXAM:
PORTABLE ABDOMEN - 1 VIEW

[abdomen]
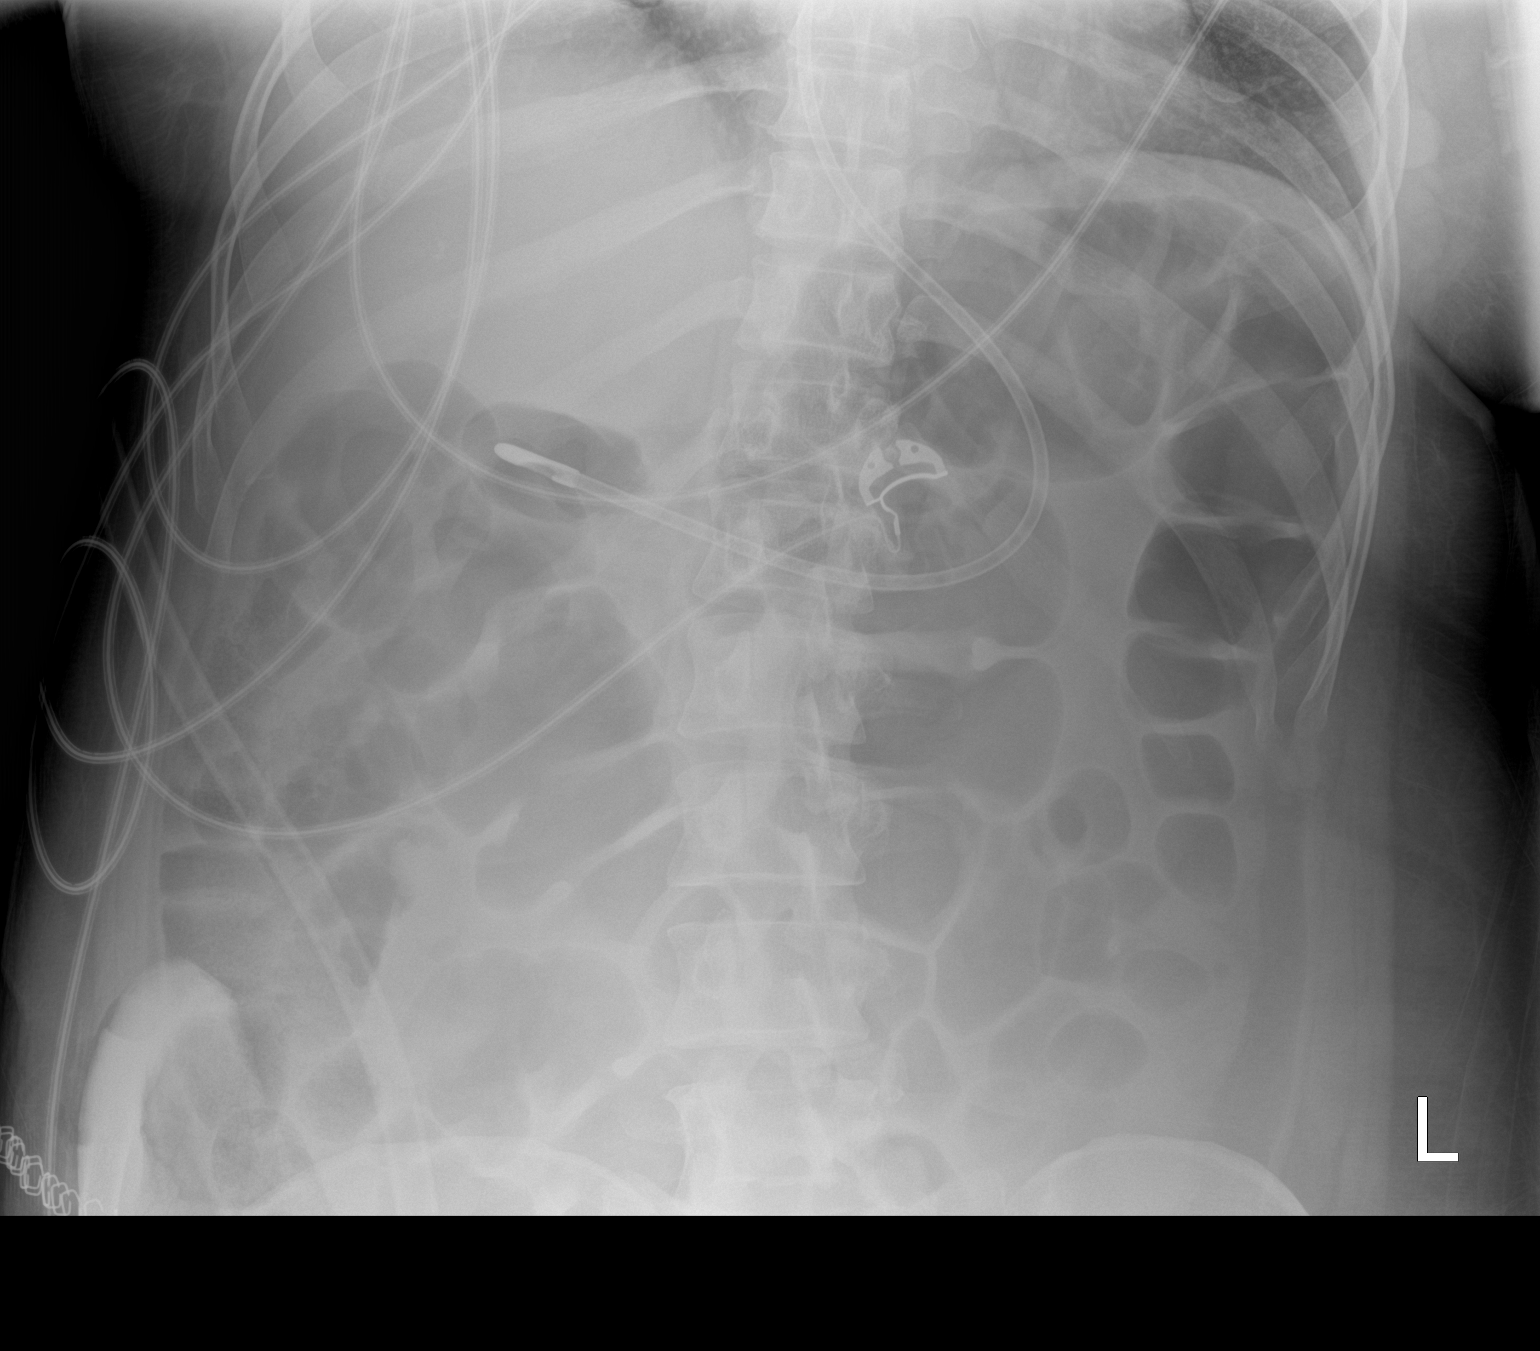

[1 of 1 positions shown; findings below may reference images not displayed]

FINDINGS: Scattered large and small bowel gas is noted. Gastric catheter has
been removed. New weighted feeding catheter is noted in the distal
stomach directed towards the duodenal bulb. Scattered large and
small bowel gas is noted. No free air is seen.
IMPRESSION: Feeding catheter in the distal stomach/duodenal bulb.

## 2020-08-13 MED ORDER — INSULIN ASPART 100 UNIT/ML IJ SOLN
6.0000 [IU] | INTRAMUSCULAR | Status: DC
  Administered 2020-08-13 – 2020-08-16 (×18): 6 [IU] via SUBCUTANEOUS

## 2020-08-13 MED ORDER — SODIUM CHLORIDE 0.9 % IV SOLN: INTRAVENOUS | Status: DC

## 2020-08-13 MED ORDER — FREE WATER
200.0000 mL | Status: DC
  Administered 2020-08-13 – 2020-08-16 (×14): 200 mL

## 2020-08-13 MED ORDER — POTASSIUM CHLORIDE 20 MEQ PO PACK
40.0000 meq | PACK | Freq: Two times a day (BID) | ORAL | Status: AC
  Administered 2020-08-13 (×2): 40 meq
  Filled 2020-08-13 (×2): qty 2

## 2020-08-13 MED ORDER — FREE WATER
100.0000 mL | Status: DC
  Administered 2020-08-13 (×2): 100 mL

## 2020-08-13 MED ORDER — DESMOPRESSIN ACETATE 4 MCG/ML IJ SOLN
4.0000 ug | Freq: Once | INTRAMUSCULAR | Status: AC
  Administered 2020-08-13: 4 ug via INTRAVENOUS
  Filled 2020-08-13: qty 1

## 2020-08-13 MED FILL — Thrombin For Soln 5000 Unit: CUTANEOUS | Qty: 5000 | Status: AC

## 2020-08-13 NOTE — Progress Notes (Signed)
NAME:  Regina Morris, MRN:  [id number], DOB:  [birth year], LOS: 6 ADMISSION DATE:  08/05/2020, CONSULTATION DATE:  08/13/2020 REFERRING MD:  Stroke, Md, MD, CHIEF COMPLAINT:  Respiratory failure, tsroke  History of Present Illness:  Regina Morris is a 40 y.o. woman who presented with headache nausea and vomiting to [facility] on 5/26. 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 Transferred to [facility]. Started on keppra. Mental status worsened 5/27 evening and the morning of 5/28 CT head showed worsening midline shift. She was taken to the OR for decompressive hemicraniotomy with a pupillar exam concerning for impending herniation. She had rsection of non-viable brain tissue during hemicraniotomy and a bone flap was placed when she was closed.   History is obtained from chart review as the patient is intubated and not able to provide response.   Pertinent  Medical History  Gestational diabetes Nephrolithiasis  Significant Hospital Events: Including procedures, antibiotic start and stop dates in addition to other pertinent events   . 5/26 Presented to [facility] with headache, nausea, vomiting . 5/26 transferred to [facility], started on keppra and HTS . 5/27 possible neurologic decline, repeat CT head shows stability of bleeding, but increase in edema . 5/27 EEG epileptogenecity in the right frontal region and cortical dysfunction of the right hemisphere, no seizures . 5/27 cortrak placed . 5/28 overnight worsening midline shift and change in neuro exam, plan for decompressive hemicraniotomy . 5/30 No neuro change.  MRI.  Anticoagulation restarted.  PMT consulted.  Sedation stopped. . 5/31, some abnormal flexion movements in upper extremities, remains on heparin, tmax 101.6, weaning well on PSV 10/5; PMT met with family, continues to be full code  Interim History / Subjective:    Blown right pupils overnight.  Left sluggish.  Heparin IV stopped per Neuro.  Repeat CTH unchanged this am.  Ongoing hyperglycemia in 200's tmax 100 overnight UOP 1.15L  +884ml/ remains net +6.4L PSV 10/5-> 5/5 doing well   Objective   Blood pressure 129/68, pulse 63, temperature 100 F (37.8 C), temperature source Axillary, resp. rate (!) 37, height 5\' 6"  (1.676 m), weight 80.6 kg, last menstrual period 08/05/2020, SpO2 100 %.    Vent Mode: PSV;CPAP FiO2 (%):  [40 %] 40 % PEEP:  [5 cmH20] 5 cmH20 Pressure Support:  [10 cmH20] 10 cmH20 Plateau Pressure:  [14 cmH20-15 cmH20] 14 cmH20   Intake/Output Summary (Last 24 hours) at 08/13/2020 0708 Last data filed at 08/13/2020 0600 Gross per 24 hour  Intake 1985.16 ml  Output 1150 ml  Net 835.16 ml   Filed Weights   07/18/2020 1216 08/11/20 0423 08/13/20 0500  Weight: 71 kg 80.3 kg 80.6 kg    Examination: General:  Critically ill young Hispanic female unresponsive on MV HEENT: MM pink/moist, ETT/ OGT, right crani site- tense, staples intact, right pupil 6/fixed-absent corneal, left 4/non reactive/ +corneal Neuro: minimal extensor/ rotation in upper extremities, triple reflex in lower extremities CV: RR, no murmur PULM:  Non labored, CTA, minimal secretions, doing well on PSV 5/5 GI: soft, +bs, purwick, crani flap site wnl Extremities: warm/dry, generalized edema 1-2+ Skin: no rashes   Labs/imaging that I havepersonally reviewed  (right click and "Reselect all SmartList Selections" daily)   5/30 MRI brain >>  1. Extensive acute infarction throughout the right cerebral convexity, including right MCA and ACA territories. Exuberant associated edema with  areas of hemorrhagic transformation. The extent of hemorrhage and edema may be mildly progressed from the recent CT from Aug 10, 2020, although comparison is difficult across modalities. Repeat CT could along more direct comparison if clinically indicated. 2. Prior right craniectomy  with brain herniating through the defect.  Slight paradoxical rightward midline shift as result (2 mm at the foramen of [place]). 3. Additional smaller infarcts in the left frontal parietal white matter and left occipital cortex. 4. Left parietal acute hemorrhage is likely similar. 5. Large right mastoid effusion. 6. Please see recent CTA and CTV for vascular findings.  [facility] 5/31 >> 1. Right greater than left hemorrhagic infarcts are stable from the MRI yesterday, progressed since the 08/10/2020 CT. 2. A small volume of intraventricular hemorrhage is new. But mass effect on the ventricles is stable with no ventriculomegaly. 3. Intracranial mass effect stable from yesterday, with leftward midline shift at the anterior septum pellucidum up to 6 mm and effaced suprasellar cistern. 4. Superior sagittal sinus thrombosis.  Right hemi craniectomy.  CTH 6/1 >> 1. Stable head CT from yesterday, aside from slightly improved patency of the suprasellar cistern. Leftward midline shift is stable at 6 to 7 mm.   - right side craniectomy following large hemorrhagic infarcts of the right ACA and MCA territories. Stable blood products and edema. Smaller hemorrhagic left frontal lobe infarcts are stable without mass effect. - stable small volume IVH without ventriculomegaly. 2. No new intracranial abnormality.  CBC: WBC 16.5-> 13.8, Hgb 7.4-> 7.2, plt stable BMET: Na trend stable 155, K 3.3, stable renal function Heparin level this am wnl->  0.4 Glucose trend 298-> 256-> 208  Resolved Hospital Problem list     Assessment & Plan:   Acute right MCA territory hemorrhage with edema, midline shift, hemorrhagic conversion Right decompressive hemicraniotomy with bone flap Acute hypoxemic respiratory failure Dural venous sinus thrombus Acute blood loss anemia Hypernatremia Hypokalemia  Hyperglycemia/ New dx diabetes, HA1c 12.9 - off sedation since 5/30 Plan: - Repeat CTH overnight stable, despite pupillary  changes.  Still is breathing on PSV well, slight left corneal, ongoing abnormal, seems more extensor movements today than flexion movements seen yesterday.  - ongoing neuro checks - SBP goal < 160, prn labetalol  - ongoing AED ppx, keppra  - will defer heparin gtt to neurology- > heparin will be restarted - add FWF 100 ml q 4hr, allow down trend in Na - NSGY following  - ongoing [place] per Stroke team/ PMT, despite poor prognosis.  - would recommend early trach if ongoing aggressive care, cortrak, eventual peg.  Respiratory drive is well, mental status remains barrier to extubation.  Day 5x of ETT - continue SSI moderate, with levemir 20 units BID, increase TF coverage 4-> 6 units q 4hr, starting to correct - KCL 40 meq  X2, recheck Mag and phos in am  - trend BMET/  CBC - strict I/Os - hypercoagulable workup underway  - tylenol prn fever, culture data ngtd, monitor off abx, fever likely neurogenic, WBC improving   Best practice (right click and "Reselect all SmartList Selections" daily)  Diet:  Tube Feed  Pain/Anxiety/Delirium protocol (if indicated): Yes (RASS goal 0) VAP protocol (if indicated): Yes DVT prophylaxis: Systemic AC- held for now GI prophylaxis: H2B Glucose control:  SSI Yes Central venous access:  N/A Arterial line:  N/A Foley:  N/A Mobility:  bed rest  PT consulted: N/A Last date of multidisciplinary goals of care discussion ongoing, per primary Neurology/ PMT Code Status:  full code Disposition: ICU   Labs   CBC: Recent Labs  Lab 07/15/2020 0250 08/03/2020 1533 08/10/20 0947 08/10/20 1632 08/11/20 0408 08/11/20 0425 08/11/20 0962 08/12/20 0405 08/12/20 0409 08/13/20 0327  WBC 16.4*  --  21.3*  --  17.4*  --   --   --  16.5* 13.8*  HGB 8.6*   < > 6.2*   < > 7.4* 6.8* 7.7* 7.5* 7.4* 7.2*  HCT 32.2*   < > 23.8*   < > 26.8* 20.0* 27.3* 22.0* 26.4* 25.6*  MCV 62.0*  --  63.1*  --  67.0*  --   --   --  66.5* 66.5*  PLT 524*  --  400  --  286  --   --   --  291  257   < > = values in this interval not displayed.    Basic Metabolic Panel: Recent Labs  Lab 08/08/20 0253 08/08/20 0939 07/20/2020 0250 07/30/2020 0914 08/10/20 0619 08/10/20 1031 08/10/20 1632 08/11/20 0408 08/11/20 0425 08/11/20 0856 08/11/20 1418 08/12/20 0405 08/12/20 0409 08/12/20 1542 08/12/20 2135 08/13/20 0327  NA 143   < > 154*   < > 160*  --    < > 155* 158* 157*   < > 159* 155*  155* 154* 156* 155*  K 3.6  --  4.2   < > 2.6*  --   --  3.0* 3.0*  --   --  3.4* 3.4*  --   --  3.3*  CL 113*  --  119*  --  >130*  --   --  126*  --   --   --   --  123*  --   --  119*  CO2 20*  --  21*  --  23  --   --  21*  --   --   --   --  25  --   --  27  GLUCOSE 194*  --  182*  --  125*  --   --  175*  --   --   --   --  316*  --   --  288*  BUN 10  --  5*  --  9  --   --  10  --   --   --   --  10  --   --  13  CREATININE 0.53  --  0.51  --  0.70  --   --  0.73  --   --   --   --  0.66  --   --  0.67  CALCIUM 8.2*  --  8.9  --  8.0*  --   --  8.0*  --   --   --   --  7.9*  --   --  8.2*  MG 2.0  --   --   --   --  1.8  --   --   --   --   --   --  2.3  --   --   --   PHOS 1.9*  --   --   --   --   --   --   --   --  2.2*  --   --   --   --   --   --    < > = values in this interval not displayed.   GFR: Estimated Creatinine Clearance: 102.1 mL/min (by C-G formula based  on SCr of 0.67 mg/dL). Recent Labs  Lab 08/06/20 2048 08/08/20 0253 08/10/20 0619 08/11/20 0408 08/12/20 0409 08/13/20 0327  PROCALCITON <0.10  --   --   --   --   --   WBC 20.5*   < > 21.3* 17.4* 16.5* 13.8*   < > = values in this interval not displayed.    Liver Function Tests: Recent Labs  Lab 08/06/20 2048  AST 16  ALT 13  ALKPHOS 89  BILITOT 1.1  PROT 7.7  ALBUMIN 3.5   Recent Labs  Lab 08/06/20 2048  LIPASE 21   No results for input(s): AMMONIA in the last 168 hours.  ABG    Component Value Date/Time   PHART 7.491 (H) 08/12/2020 0405   PCO2ART 35.6 08/12/2020 0405   PO2ART 182  (H) 08/12/2020 0405   HCO3 26.9 08/12/2020 0405   TCO2 28 08/12/2020 0405   ACIDBASEDEF 1.0 08/11/2020 0425   O2SAT 100.0 08/12/2020 0405     Coagulation Profile: Recent Labs  Lab 07/30/2020 0410  INR 1.1    Cardiac Enzymes: No results for input(s): CKTOTAL, CKMB, CKMBINDEX, TROPONINI in the last 168 hours.  HbA1C: Hgb A1c MFr Bld  Date/Time Value Ref Range Status  08/12/2020 07:55 AM 12.9 (H) 4.8 - 5.6 % Final    Comment:    (NOTE)         Prediabetes: 5.7 - 6.4         Diabetes: >6.4         Glycemic control for adults with diabetes: <7.0   12/12/2019 09:57 PM 11.7 (H) 4.8 - 5.6 % Final    Comment:    (NOTE) Pre diabetes:          5.7%-6.4%  Diabetes:              >6.4%  Glycemic control for   <7.0% adults with diabetes    CCT: 30 mins   Kennieth Rad, ACNP [facility] 08/13/2020, 7:08 AM

## 2020-08-13 NOTE — Progress Notes (Signed)
This chaplain responded to Dr. Roda Shutters consult for spiritual care.  The chaplain communicated with the Pt. RN-[place]ase before the visit. Family is not present at the bedside.  The chaplain shared a bedside presence and prayer with the Pt.   The chaplain understands the time of a family visit is uncertain.  The Pt. RN agreed to paging this [phone]) when family arrives or paging Spiritual Care. The chaplain will  offer F/U spiritual care and assistance contacting a [employer] priest.

## 2020-08-13 NOTE — Progress Notes (Signed)
Date and time results received: 08/13/20  2100  Test: Sodium Critical Value: 171  Name of Provider Notified: [place]  Orders Received? Or Actions Taken?:This RN notified Dr. Otelia Limes. No interventions needed at this time. Maintain fluids at current rate. Will continue to monitor Na level.  Harriett Sine, RN

## 2020-08-13 NOTE — Progress Notes (Signed)
Rt transported pt. To CT scan via vent from 4N to Ct with no incident

## 2020-08-13 NOTE — Progress Notes (Signed)
SLP Cancellation Note  Patient Details Name: Regina Morris MRN: [id number] DOB: 12/06/1980   Cancelled treatment:       Reason Eval/Treat Not Completed: Medical issues which prohibited therapy. Pt not appropriate for therapy. Will sign off.    Regina Morris, Riley Nearing 08/13/2020, 7:46 AM

## 2020-08-13 NOTE — Progress Notes (Signed)
Patient with minimal movement to pain. Right pupil now fixed and dilated at 6 mm. Left pupil 5 mm and sluggishly reactive.   DDx includes increased swelling of the stroke bed and worsened hemorrhage.  A/R: - Holding IV heparin - Obtaining STAT CT head  Electronically signed: Dr. Caryl Pina

## 2020-08-13 NOTE — Progress Notes (Signed)
STROKE TEAM PROGRESS NOTE   INTERVAL HISTORY RN is at the bedside. Per RN, pt had overnight right pupil enlargement, heparin IV on hold and repeat CT showed no new bleeding or infarct but increased cerebral edema. Na 158 this am. On my exam, pupil b/l 5mm, with light constant stimulation, pupil b/l slowly down to 4mm. BUE extension posturing and BLE triple reflex with stimulation.  Vitals:   08/13/20 0700 08/13/20 0739 08/13/20 0800 08/13/20 0823  BP: (!) 145/70 (!) 138/57 140/72 140/72  Pulse: 61 (!) 59 65   Resp: (!) 26 (!) 26 (!) 25   Temp:   99.9 F (37.7 C)   TempSrc:   Axillary   SpO2: 100% 99% 100% 100%  Weight:      Height:       CBC:  Recent Labs  Lab 08/12/20 0409 08/13/20 0327  WBC 16.5* 13.8*  HGB 7.4* 7.2*  HCT 26.4* 25.6*  MCV 66.5* 66.5*  PLT 291 257   Basic Metabolic Panel:  Recent Labs  Lab 08/08/20 0253 08/08/20 0939 08/10/20 1031 08/10/20 1632 08/11/20 0856 08/11/20 1418 08/12/20 0409 08/12/20 1542 08/13/20 0327 08/13/20 1012  NA 143   < >  --    < > 157*   < > 155*  155*   < > 155* 158*  K 3.6   < >  --    < >  --    < > 3.4*  --  3.3*  --   CL 113*   < >  --    < >  --   --  123*  --  119*  --   CO2 20*   < >  --    < >  --   --  25  --  27  --   GLUCOSE 194*   < >  --    < >  --   --  316*  --  288*  --   BUN 10   < >  --    < >  --   --  10  --  13  --   CREATININE 0.53   < >  --    < >  --   --  0.66  --  0.67  --   CALCIUM 8.2*   < >  --    < >  --   --  7.9*  --  8.2*  --   MG 2.0  --  1.8  --   --   --  2.3  --   --   --   PHOS 1.9*  --   --   --  2.2*  --   --   --   --   --    < > = values in this interval not displayed.   Lipid Panel:  Recent Labs  Lab 08/04/2020 0755  CHOL 276*  TRIG 168*  HDL 45  CHOLHDL 6.1  VLDL 34  LDLCALC 161197*   HgbA1c:  Recent Labs  Lab 07/14/2020 0755  HGBA1C 12.9*   Urine Drug Screen:  Recent Labs  Lab 07/30/2020 1258  LABOPIA NONE DETECTED  COCAINSCRNUR NONE DETECTED  LABBENZ NONE DETECTED   AMPHETMU NONE DETECTED  THCU NONE DETECTED  LABBARB NONE DETECTED    Alcohol Level  Recent Labs  Lab 07/28/2020 0424  ETH <10    IMAGING  CT ABDOMEN PELVIS WO CONTRAST Result Date: 07/26/2020 IMPRESSION: No acute intra-abdominal pathology  identified.   DG Chest 2 View Result Date: 07/23/2020 MPRESSION: No active cardiopulmonary disease.   CT Head Wo Contrast Result Date: 07/16/2020 IMPRESSION: Large posterior right MCA territory infarct involving the posterior right temporal cortex with superimposed moderate subarachnoid hemorrhage interdigitating within the sulci of the infarcted region and tiny subdural hematoma. Mild mass effect. No midline shift.   MR ANGIO HEAD WO CONTRAST Result Date: 07/27/2020 IMPRESSION:  1. Significantly degraded by motion.  2. Narrowed/irregularity right M2 branches correlating with the acute infarct. No detectable arterial disease elsewhere.   MR BRAIN WO CONTRAST Result Date: 07/19/2020 CT. IMPRESSION:  1. Acute infarct involving the inferior division right MCA territory with petechial hemorrhage and 4 mm midline shift.  2. Diffusion only study due to patient condition.   CT HEAD WO CONTRAST  Result Date: 08/13/2020 CLINICAL DATA:  40 year old female with hemorrhagic right hemispheric infarcts, dural sinus thrombosis, craniectomy. EXAM: CT HEAD WITHOUT CONTRAST TECHNIQUE: Contiguous axial images were obtained from the base of the skull through the vertex without intravenous contrast. COMPARISON:  08/12/2020 head CT and earlier. FINDINGS: Brain: Confluent right ACA and MCA infarcts with malignant hemorrhagic transformation and abundant cytotoxic edema. Stable extent of brain herniation through the craniectomy. Leftward midline shift at the anterior septum pellucidum remains stable at 6 to 7 mm (series 3, image 8). Improved patency of the suprasellar cistern since yesterday. Remaining basilar cisterns are stable. Incidental mega cisterna magna, normal  variant. Stable mass effect on the right lateral ventricle. No trapped or enlarged ventricles. Small volume of IVH is stable since yesterday, including in the 3rd and 4th ventricles. Smaller hemorrhagic infarcts in the left frontal lobe are stable (series 3, image 14) with no significant mass effect. Stable gray-white matter differentiation elsewhere. Vascular: Stable. Skull: Stable.  Right craniectomy. Sinuses/Orbits: Stable right middle ear and mastoid opacification. Other Visualized paranasal sinuses and mastoids are stable and well aerated. Other: Stable postoperative changes to the scalp.  Negative orbits. IMPRESSION: 1. Stable head CT from yesterday, aside from slightly improved patency of the suprasellar cistern. Leftward midline shift is stable at 6 to 7 mm. - right side craniectomy following large hemorrhagic infarcts of the right ACA and MCA territories. Stable blood products and edema. Smaller hemorrhagic left frontal lobe infarcts are stable without mass effect. - stable small volume IVH without ventriculomegaly. 2. No new intracranial abnormality. Electronically Signed   By: Odessa Fleming M.D.   On: 08/13/2020 06:56   CT HEAD WO CONTRAST  Result Date: 08/12/2020 CLINICAL DATA:  40 year old female status post hemorrhagic right MCA and ACA region infarcts. Dural sinus thrombosis. Craniectomy. EXAM: CT HEAD WITHOUT CONTRAST TECHNIQUE: Contiguous axial images were obtained from the base of the skull through the vertex without intravenous contrast. COMPARISON:  Brain MRI 08/11/2020.  Head CT 08/10/2020 and earlier. FINDINGS: Brain: Extensive hemorrhagic infarction of the right hemisphere, relatively sparing the right deep gray nuclei. Progressed right hemisphere edema since the CT on 08/10/2020, not significantly different from the MRI yesterday. Extension of brain parenchyma through the right side craniectomy is stable. Associated leftward midline shift of 6 mm at the anterior septum pellucidum appears mildly  increased. Effaced suprasellar cistern is stable. Other basilar cisterns are preserved. Stable smaller left hemisphere infarcts with occasional hemorrhage from the MRI yesterday (series 3, image 22). Brainstem and cerebellum remain spared. Lateral ventricle mass effect has increased since 08/10/2020, stable since yesterday. Small volume of intraventricular hemorrhage is new since 08/10/2020. No ventriculomegaly. No other extra-axial blood identified. Vascular:  Hyperdensity associated with superior sagittal sinus thrombosis again noted. Skull: Stable right hemi craniectomy. Sinuses/Orbits: Right tympanic cavity and mastoid opacification is stable. Other Visualized paranasal sinuses and mastoids are stable and well aerated. Other: Postoperative changes to the scalp. Stable associated broad-based scalp hematoma. Disconjugate gaze. IMPRESSION: 1. Right greater than left hemorrhagic infarcts are stable from the MRI yesterday, progressed since the 08/10/2020 CT. 2. A small volume of intraventricular hemorrhage is new. But mass effect on the ventricles is stable with no ventriculomegaly. 3. Intracranial mass effect stable from yesterday, with leftward midline shift at the anterior septum pellucidum up to 6 mm and effaced suprasellar cistern. 4. Superior sagittal sinus thrombosis.  Right hemi craniectomy. Electronically Signed   By: Odessa Fleming M.D.   On: 08/12/2020 05:56   DG Abd Portable 1V  Result Date: 08/11/2020 CLINICAL DATA:  40 year old female status post orogastric tube placement. EXAM: PORTABLE ABDOMEN - 1 VIEW COMPARISON:  Abdominal radiograph 08/10/2020. FINDINGS: Enteric tube noted with tip and side port in the body of the stomach. The bowel gas pattern is normal. No radio-opaque calculi or other significant radiographic abnormality are seen. IMPRESSION: 1. Enteric tube positioned with tip and side port in the body of the stomach. 2. Nonobstructive bowel gas pattern. Electronically Signed   By: Trudie Reed  M.D.   On: 08/11/2020 12:53   EEG adult  Result Date: 08/11/2020 Charlsie Quest, MD     08/11/2020  3:49 PM Patient Name: Marylyn Appenzeller MRN: [id number] Epilepsy Attending: Charlsie Quest Referring Physician/Provider: Dr Marvel Plan Date: 08/11/2020 Duration: 24.17 mins  Patient history: 40 year old female with right MCA infarct with hemorrhagic transformation with worsening mental status.  EEG to evaluate for seizures.  Level of alertness:  Comatose  AEDs during EEG study: Keppra  Technical aspects: This EEG study was done with scalp electrodes positioned according to the 10-20 International system of electrode placement. Electrical activity was acquired at a sampling rate of  and reviewed with a high frequency filter of  and a low frequency filter of . EEG data were recorded continuously and digitally stored.  Description: EEG showed continuous generalized 3 to 5 Hz theta-delta slowing. Run of sharp waves was noted in right frontocentral region without definite evolution suggestive of brief ictal-interictal rhythmic discharges.  Hyperventilation and photic stimulation were not performed.    ABNORMALITY -Brief ictal-interictal rhythmic discharges, right frontocentral region -Continuous slow, generalized  IMPRESSION: This study showed evidence of epileptogenicity arising from right frontocentral region with high potential for seizures.  Additionally there is severe diffuse encephalopathy, nonspecific etiology. No definite seizures  were seen throughout the recording.  Priyanka Annabelle Harman      PHYSICAL EXAM  Temp:  [99.9 F (37.7 C)-101.2 F (38.4 C)] 99.9 F (37.7 C) (06/01 0800) Pulse Rate:  [59-98] 65 (06/01 0800) Resp:  [23-37] 25 (06/01 0800) BP: (129-175)/(53-74) 140/72 (06/01 0823) SpO2:  [99 %-100 %] 100 % (06/01 0823) FiO2 (%):  [40 %] 40 % (06/01 0823) Weight:  [80.6 kg] 80.6 kg (06/01 0500)  General - Well nourished, well developed, intubated off  sedation.  Ophthalmologic - fundi not visualized due to noncooperation.  Cardiovascular - Regular rhythm and rate.    Neuro - intubated off sedation, eyes closed, not open eyes on stimulation, not following commands. With forced eye opening, eyes in mid position, not blinking to visual threat, doll's eyes sluggish, not tracking, pupil equal size 44mm->4mm, tonic pupil, sluggish to light. Corneal reflex weakly present bilaterally,  gag and cough strongly present. Breathing over the vent.  Facial symmetry not able to test due to ET tube.  Tongue protrusion not cooperative. On pain stimulation, BUEs extension posturing, BLEs triple reflexes. DTR diminished and no babinski. Sensation, coordination and gait not tested.   ASSESSMENT/PLAN Ms. Bess Saltzman is a 40 y.o. female with history of gestational diabetes, kidney stone  presenting with nausea, vomiting and R temple headache.  CT head demonstrated a right posterior MCA stroke with moderate subarachnoid hemorrhage interdigitating within the sulci of the infarcted region and tiny SDH with mild mass effect.   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   MRA Narrowed/irregularity right M2 branches correlating with the acute infarct.  CTA head and neck 5/29 - Very attenuated right MCA branches similar to prior MRA with high-grade right M1 segment narrowing and no enhancing vessels at the level of initially visualized infarct.  MRI 5/30 showed Extensive acute infarction throughout the right cerebral convexity, including right MCA and ACA territories. Exuberant associated edema with areas of hemorrhagic transformation. The extent of hemorrhage and edema may be mildly progressed  CT head 5/31 Right greater than left hemorrhagic infarcts are  stable from the MRI yesterday. Intracranial mass effect stable from yesterday, with leftward midline shift at the anterior septum pellucidum up to 6 mm and effaced suprasellar cistern. Superior sagittal sinus thrombosis.  CT 6/1 no new bleeding or infarct but increased cerebral edema and MLS.   Carotid Doppler unremarkable  2D Echo EF 60-65%   SARs coronavirus negative  Hypercoag work up pending  TCD bubble study no PFO  LDL 197  HgbA1c 12.9  VTE prophylaxis - SCDs  No antithrombotic/anticoagulant prior to admission, now on heparin IV per stroke protocol.   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 Valeta Harms - OK for anticoagulation  Discussed with Dr. Sherlon Handing - I feel pt is poor candidate for further intervention given current medical condition  on heparin IV    Cerebral Edema Uncal herniation s/p [facility]  CT repeat 5/28 showed MLS worsened to 8-67mm  Exam concerning for early uncal herniation on the right  S/p [facility] with Dr. Venetia Maxon (NSG)  CT repeat 5/29 - resolved MLS  On HTS at 75cc/hr -> 1/2NS @ 50 -> NS @ 30->FW  Na 135->136->140->143->149->154->156->160->155->158  CT head 5/31 Right greater than left hemorrhagic infarcts are stable from the MRI yesterday. Intracranial mass effect stable from yesterday, with leftward midline shift at the anterior septum pellucidum up to 6 mm and effaced suprasellar cistern.   CT head 6/1 no new bleeding or infarct but increased cerebral edema.  Probable seizure  EEG 5/27 potential seizure focus on the right  EEG 5/30 epileptogenicity arising from right frontocentral region with high potential for seizures.    On Keppra 500mg  bid  Seizure  precautions  Diabetes, uncontrolled   Home meds:  Metformin 500mg  daily  HgbA1c 12.9, goal < 7.0  CBGs  Hyperglycemia with TF  Put on levemir 10U ->20U bid   SSI Q4h  Diabetes coordinator consult  Hypertension  Home meds:  none  Stable . SBP goal < 160 due to hemorrhagic conversion . Long-term BP goal normotensive  Hyperlipidemia  Home meds:  none  LDL 197 goal < 70  On lipitor 80  Continue statin on discharge  Leukocytosis Fever, ? central   WBC 20->17.5->16.4->21.3->17.4->16.5->13.8  Tmax 99.9->100->101.2->100.6->101.9->100  UA neg  CXR neg  Blood culture NGTD  Severe anemia with acute blood loss  Baseline anemia   This admission, Hb 8.5->7.5->6.8->6.2->PRBC->7.7->7.4->7.2  S/p PRBC x 1  Continue heparin IV for now  Other Stroke Risk Factors    Other Active Problems  Thrombocytosis platelet 715->524->400->286->291->257  Hospital day #6  This patient is critically ill due to malignant right MCA syndrome, severe cerebral edema, seizure, hyperglycemia, severe anemia, status post decompressive hemicraniotomy and at significant risk of neurological worsening, death form brain herniation, brain death, status epilepticus, central fever, DKA. This patient's care requires constant monitoring of vital signs, hemodynamics, respiratory and cardiac monitoring, review of multiple databases, neurological assessment, discussion with family, other specialists and medical decision making of high complexity. I spent 45 minutes of neurocritical care time in the care of this patient.  I discussed with Dr. Rodman Key, MD PhD Stroke Neurology 08/13/2020 11:23 AM   To contact Stroke Continuity provider, please refer to [email or web address]. After hours, contact General Neurology

## 2020-08-13 NOTE — Progress Notes (Signed)
ANTICOAGULATION CONSULT NOTE  Pharmacy Consult:  Heparin Indication:  Cerebral venous sinus thrombosis  Labs: Recent Labs    08/11/20 0408 08/11/20 0425 08/12/20 0405 08/12/20 0409 08/12/20 1219 08/12/20 1921 08/13/20 0327  HGB 7.4*   < > 7.5* 7.4*  --   --  7.2*  HCT 26.8*   < > 22.0* 26.4*  --   --  25.6*  PLT 286  --   --  291  --   --  257  HEPARINUNFRC  --    < >  --  0.12* 0.16* 0.23* 0.40  CREATININE 0.73  --   --  0.66  --   --  0.67   < > = values in this interval not displayed.    Assessment: 30 YOF presented with nausea, vomiting and right temple headache, found to have hemorrhagic R MCA infarct with midline shift and mass effect.  Most recent CT on 5/29 showed progressive edema with newly seen subcortical hemorrhage in the left frontal region.  Subsequent venogram positive for dural venous sinus thrombosis.  Pharmacy consulted to dose IV heparin, low goal and not bolus.   Heparin was held this morning due to concern for fixed pupils. A stat head CT was stable - therefore the decision was made to resume Heparin. Heparin was restarted at the previously therapeutic rate of 1400 units/hr around 1200. Will re-time heparin labs  Goal of Therapy:  Heparin level 0.3-0.5 units/ml Monitor platelets by anticoagulation protocol: Yes   Plan:  - Restart Heparin at 1400 units/hr - No bolus and aiming for low goal 0.3-0.5 - Will continue to monitor for any signs/symptoms of bleeding and will follow up with heparin level in 6 hours from restarting  Thank you for allowing pharmacy to be a part of this patient's care.  Georgina Pillion, PharmD, BCPS Clinical Pharmacist Clinical phone for 08/13/2020: [phone] 08/13/2020 1:14 PM   **Pharmacist phone directory can now be found on [email or web address] ([email or web address]).  Listed under [facility].

## 2020-08-13 NOTE — Progress Notes (Signed)
Pt was transported to CT scan and back to 4N27 without complications.

## 2020-08-13 NOTE — Plan of Care (Signed)
Received a call from RN that patient neuro change with No movement in upper extremities to pain, flicker of movement in lower extremities in pain but decreased from earlier. She is more tachypnic 38/39. No gag and weaker cough. Pupils now more equal and nonreactive. Dr. Denese Killings also saw patient and found patient unresponsive, deteriorated from this morning.  Stat CT ordered, which showed worsening mass-effect, slight increase of midline shift however, more downward herniation with effacement of basal cisterns.  Patient also found to have sodium 167, urine specific gravity 1.004, likely in DI.   I called patient husband through three-way call, updated pt worsening condition, poor prognosis, and likely unsurvivable.  Husband expressed understanding, but stated that he would like to do our best and put pt "in god's hand".  He will come tomorrow afternoon at the earliest.  I discussed with Dr. Denese Killings, will put on DDAVP and free water, but patient condition likely deteriorating fast to brain death.   Marvel Plan, MD PhD Stroke Neurology 08/13/2020 6:45 PM

## 2020-08-13 NOTE — Procedures (Signed)
Cortrak  Person Inserting Tube:  Kendell Bane C, RD Tube Type:  Cortrak - 43 inches Tube Location:  Left nare Initial Placement:  Stomach Secured by: Bridle Technique Used to Measure Tube Placement:  Documented cm marking at nare/ corner of mouth Cortrak Secured At:  62 cm    Cortrak Tube Team Note:  Consult received to place a Cortrak feeding tube.   X-ray is required, abdominal x-ray has been ordered by the Cortrak team. Please confirm tube placement before using the Cortrak tube.   If the tube becomes dislodged please keep the tube and contact the Cortrak team at [email or web address] ([email or web address]) for replacement.  If after hours and replacement cannot be delayed, place a NG tube and confirm placement with an abdominal x-ray.    Cammy Copa., RD, LDN, CNSC See AMiON for contact information

## 2020-08-13 NOTE — Progress Notes (Signed)
Nutrition Follow-up  DOCUMENTATION CODES:   Not applicable  INTERVENTION:   Jevity 1.5 @ 55 ml/hr via cortrak tube (1320 ml/day)  45 ml Prosource TF daily  Tube feeding regimen provides 2020 kcal (100% of needs), 95 grams of protein, and 1003 ml of H2O.   100 ml free water every 4 hours Total free water: 1603 ml   NUTRITION DIAGNOSIS:   Inadequate oral intake related to inability to eat as evidenced by NPO status. Ongoing.   GOAL:   Patient will meet greater than or equal to 90% of their needs Met with TF.   MONITOR:   Diet advancement,Labs,Weight trends,TF tolerance,Skin,I & O's  REASON FOR ASSESSMENT:   Consult Enteral/tube feeding initiation and management  ASSESSMENT:   Regina Morris is a 40 y.o. female with PMH significant for gestational diabetes, kidney stone who presented to [facility] ED with nausea, vomiting and R temple headache. Workup with CTH demonstrated a R posterior MCA stroke with moderate subarachnoid hemorrhage interdigitating within the sulci of the infarcted region and tiny SDH with mild mass effect.  Per CCM "Catastrophic stroke with limited neurological recovery to this point.  Brainstem function appears preserved but there is little evidence of higher cortical function at this time" Plan for trach placement later this week and cortrak requested.   5/27 cortrak placement x 2 5/28 worsening MS possible early herniation, intubated. Per Neurosurgery pt with extensive dural venous sinus thrombosis s/p decompressive craniectomy with bone flap in abdomen  6/1 s/p cortrak placement; tip gastric   Patient is currently intubated on ventilator support MV: 9.5 L/min Temp (24hrs), Avg:99.9 F (37.7 C), Min:99.9 F (37.7 C), Max:100 F (37.8 C)  Medications reviewed and include: SSI, novolog 6 units every 4 hours, 20 units levemir BID, miralax, KCl 40 mEq BID, senokot-s Labs reviewed: Na 158, K+ 3.3 CBG's: 149-254   Diet Order:   Diet Order             Diet NPO time specified  Diet effective now                 EDUCATION NEEDS:   No education needs have been identified at this time  Skin:  Skin Assessment: Reviewed RN Assessment  Last BM:  6/1  Height:   Ht Readings from Last 1 Encounters:  07/21/2020 $RemoveB'5\' 6"'bxfTegAc$  (1.676 m)    Weight:   Wt Readings from Last 1 Encounters:  08/13/20 80.6 kg    Ideal Body Weight:  59.1 kg  BMI:  Body mass index is 28.68 kg/m.  Estimated Nutritional Needs:   Kcal:  1850-2050  Protein:  95-110 grams  Fluid:  > 1.8 L  Hinda Lindor P., RD, LDN, CNSC See AMiON for contact information

## 2020-08-13 NOTE — Progress Notes (Signed)
ANTICOAGULATION CONSULT NOTE - Follow Up Consult  Pharmacy Consult for heparin Indication: dural venous sinus thrombosis  Labs: Recent Labs    08/11/20 0408 08/11/20 0425 08/12/20 0405 08/12/20 0409 08/12/20 1219 08/12/20 1921 08/13/20 0327  HGB 7.4*   < > 7.5* 7.4*  --   --  7.2*  HCT 26.8*   < > 22.0* 26.4*  --   --  25.6*  PLT 286  --   --  291  --   --  257  HEPARINUNFRC  --    < >  --  0.12* 0.16* 0.23* 0.40  CREATININE 0.73  --   --  0.66  --   --  0.67   < > = values in this interval not displayed.    Assessment/Plan:  40yo female therapeutic on heparin after rate adjustments. Will continue gtt at current rate of 1400 units/hr and confirm stable with additional level.   Vernard Gambles, PharmD, BCPS  08/13/2020,4:22 AM

## 2020-08-13 NOTE — Progress Notes (Signed)
  PCCM Interval Note  Notifed by RN that patient less responsive to noxious stimuli-> no response to noxious stimuli in uppers, trace triple flex in LE, now tachypneic near 40 on PSV despite fentanyl earlier, pupils now equal/ unresponsive, and febrile 102.9.  Na also trending up, now critically 167  Unsure UOP, as she has had a purwick and leaking around.   P: Stat CTH- overall symptoms concerning for impending herniation despite Na 167 and previous decompressive hemicrani on 5/28 Insert foley- check SG, urine OSM Will place back on full MV support  Tylenol prn / cooling measures  Discussed with Dr. Denese Killings   CCT 20 mins    Regina Morris, ACNP [facility] 08/13/2020, 5:01 PM

## 2020-08-13 NOTE — Progress Notes (Signed)
Inpatient Diabetes Program Recommendations  AACE/ADA: New Consensus Statement on Inpatient Glycemic Control (2015)  Target Ranges:  Prepandial:   less than 140 mg/dL      Peak postprandial:   less than 180 mg/dL (1-2 hours)      Critically ill patients:  140 - 180 mg/dL   Lab Results  Component Value Date   GLUCAP 208 (H) 08/13/2020   HGBA1C 12.9 (H) Aug 15, 2020    Review of Glycemic Control Results for [name], Regina Morris (MRN [id number]) as of 08/13/2020 09:09  Ref. Range 08/12/2020 16:02 08/12/2020 19:54 08/12/2020 23:36 08/13/2020 03:47 08/13/2020 08:11  Glucose-Capillary Latest Ref Range: 70 - 99 mg/dL 373 (H) 428 (H) 768 (H) 256 (H) 208 (H)    Current orders for Inpatient glycemic control:  Levemir 20 units BID Novolog 4 units Q4H Novolog 0-15 units Q4H Jevity 1.5 @ 55 ml/hr  Inpatient Diabetes Program Recommendations:     Novolog 6 units Q4H tube feed coverage.  Stop if feeds held or discontinued.   Will continue to follow while inpatient.  Thank you, Dulce Sellar, RN, BSN Diabetes Coordinator Inpatient Diabetes Program [phone] (team pager from 8a-5p)

## 2020-08-13 NOTE — Progress Notes (Signed)
Subjective: Patient continues to be intubated and unresponsive. Over night examination revealed less movement and pupillary changes. CTH performed.   Objective: Vital signs in last 24 hours: Temp:  [99.9 F (37.7 C)-101.9 F (38.8 C)] 100 F (37.8 C) (06/01 0400) Pulse Rate:  [61-99] 61 (06/01 0700) Resp:  [23-37] 26 (06/01 0700) BP: (129-175)/(53-86) 145/70 (06/01 0700) SpO2:  [100 %] 100 % (06/01 0700) FiO2 (%):  [40 %] 40 % (06/01 0628) Weight:  [80.6 kg] 80.6 kg (06/01 0500)  Intake/Output from previous day: 05/31 0701 - 06/01 0700 In: 1985.2 [I.V.:975.2; NG/GT:1010] Out: 1150 [Urine:1150] Intake/Output this shift: No intake/output data recorded.  Physical Exam: Patient is intubated and unresponsive, not currently on any sedation. No eye opening to noxious stimuli. Not able to follow commands. Left pupil 14mm and sluggish, right pupil 44mm and nonreactive, impaired bilateral corneals, minimal withdrawal on left, unable to get w/d in RLE, minimal in RUE. Incision c/d/i, flap full and somewhat tense  Lab Results: Recent Labs    08/12/20 0409 08/13/20 0327  WBC 16.5* 13.8*  HGB 7.4* 7.2*  HCT 26.4* 25.6*  PLT 291 257   BMET Recent Labs    08/12/20 0409 08/12/20 1542 08/12/20 2135 08/13/20 0327  NA 155*  155*   < > 156* 155*  K 3.4*  --   --  3.3*  CL 123*  --   --  119*  CO2 25  --   --  27  GLUCOSE 316*  --   --  288*  BUN 10  --   --  13  CREATININE 0.66  --   --  0.67  CALCIUM 7.9*  --   --  8.2*   < > = values in this interval not displayed.    Studies/Results: CT HEAD WO CONTRAST  Result Date: 08/13/2020 CLINICAL DATA:  40 year old female with hemorrhagic right hemispheric infarcts, dural sinus thrombosis, craniectomy. EXAM: CT HEAD WITHOUT CONTRAST TECHNIQUE: Contiguous axial images were obtained from the base of the skull through the vertex without intravenous contrast. COMPARISON:  08/12/2020 head CT and earlier. FINDINGS: Brain: Confluent right ACA and  MCA infarcts with malignant hemorrhagic transformation and abundant cytotoxic edema. Stable extent of brain herniation through the craniectomy. Leftward midline shift at the anterior septum pellucidum remains stable at 6 to 7 mm (series 3, image 8). Improved patency of the suprasellar cistern since yesterday. Remaining basilar cisterns are stable. Incidental mega cisterna magna, normal variant. Stable mass effect on the right lateral ventricle. No trapped or enlarged ventricles. Small volume of IVH is stable since yesterday, including in the 3rd and 4th ventricles. Smaller hemorrhagic infarcts in the left frontal lobe are stable (series 3, image 14) with no significant mass effect. Stable gray-white matter differentiation elsewhere. Vascular: Stable. Skull: Stable.  Right craniectomy. Sinuses/Orbits: Stable right middle ear and mastoid opacification. Other Visualized paranasal sinuses and mastoids are stable and well aerated. Other: Stable postoperative changes to the scalp.  Negative orbits. IMPRESSION: 1. Stable head CT from yesterday, aside from slightly improved patency of the suprasellar cistern. Leftward midline shift is stable at 6 to 7 mm. - right side craniectomy following large hemorrhagic infarcts of the right ACA and MCA territories. Stable blood products and edema. Smaller hemorrhagic left frontal lobe infarcts are stable without mass effect. - stable small volume IVH without ventriculomegaly. 2. No new intracranial abnormality. Electronically Signed   By: Odessa Fleming M.D.   On: 08/13/2020 06:56   CT HEAD WO CONTRAST  Result Date: 08/12/2020 CLINICAL DATA:  40 year old female status post hemorrhagic right MCA and ACA region infarcts. Dural sinus thrombosis. Craniectomy. EXAM: CT HEAD WITHOUT CONTRAST TECHNIQUE: Contiguous axial images were obtained from the base of the skull through the vertex without intravenous contrast. COMPARISON:  Brain MRI 08/11/2020.  Head CT 08/10/2020 and earlier. FINDINGS:  Brain: Extensive hemorrhagic infarction of the right hemisphere, relatively sparing the right deep gray nuclei. Progressed right hemisphere edema since the CT on 08/10/2020, not significantly different from the MRI yesterday. Extension of brain parenchyma through the right side craniectomy is stable. Associated leftward midline shift of 6 mm at the anterior septum pellucidum appears mildly increased. Effaced suprasellar cistern is stable. Other basilar cisterns are preserved. Stable smaller left hemisphere infarcts with occasional hemorrhage from the MRI yesterday (series 3, image 22). Brainstem and cerebellum remain spared. Lateral ventricle mass effect has increased since 08/10/2020, stable since yesterday. Small volume of intraventricular hemorrhage is new since 08/10/2020. No ventriculomegaly. No other extra-axial blood identified. Vascular: Hyperdensity associated with superior sagittal sinus thrombosis again noted. Skull: Stable right hemi craniectomy. Sinuses/Orbits: Right tympanic cavity and mastoid opacification is stable. Other Visualized paranasal sinuses and mastoids are stable and well aerated. Other: Postoperative changes to the scalp. Stable associated broad-based scalp hematoma. Disconjugate gaze. IMPRESSION: 1. Right greater than left hemorrhagic infarcts are stable from the MRI yesterday, progressed since the 08/10/2020 CT. 2. A small volume of intraventricular hemorrhage is new. But mass effect on the ventricles is stable with no ventriculomegaly. 3. Intracranial mass effect stable from yesterday, with leftward midline shift at the anterior septum pellucidum up to 6 mm and effaced suprasellar cistern. 4. Superior sagittal sinus thrombosis.  Right hemi craniectomy. Electronically Signed   By: Odessa Fleming M.D.   On: 08/12/2020 05:56   MR BRAIN WO CONTRAST  Result Date: 08/11/2020 CLINICAL DATA:  Stroke follow-up. EXAM: MRI HEAD WITHOUT CONTRAST TECHNIQUE: Multiplanar, multiecho pulse sequences of  the brain and surrounding structures were obtained without intravenous contrast. COMPARISON:  CT head Aug 10, 2020. FINDINGS: Brain: Extensive acute infarction throughout the right cerebral convexity, including right MCA and ACA territories.There is exuberant associated cytotoxic edema with areas of hemorrhagic transformation. The extent of hemorrhage and edema may be mildly progressed from the prior CT from Aug 10, 2020, although comparison is difficult across modalities. Prior right craniectomy with brain herniating through the defect. Slight paradoxical rightward midline shift as result (2 mm at the foramen of [place]). Additional smaller infarcts in the left frontal parietal white matter and left occipital cortex. Left parietal acute hemorrhage is likely similar. No hydrocephalus. Mildly prominent retro cerebellar CSF, likely chronic. Vascular: Major arterial flow voids at the skull base are maintained. Please see prior CTA for further arterial evaluation. Additionally, please see recent CT of the and v for evaluation of the dural venous sinuses. Skull and upper cervical spine: Normal marrow signal. Sinuses/Orbits: Mild mucosal thickening. No acute orbital abnormality. Other: Large right mastoid effusion. IMPRESSION: 1. Extensive acute infarction throughout the right cerebral convexity, including right MCA and ACA territories. Exuberant associated edema with areas of hemorrhagic transformation. The extent of hemorrhage and edema may be mildly progressed from the recent CT from Aug 10, 2020, although comparison is difficult across modalities. Repeat CT could along more direct comparison if clinically indicated. 2. Prior right craniectomy with brain herniating through the defect. Slight paradoxical rightward midline shift as result (2 mm at the foramen of [place]). 3. Additional smaller infarcts in the left frontal parietal  white matter and left occipital cortex. 4. Left parietal acute hemorrhage is likely similar.  5. Large right mastoid effusion. 6. Please see recent CTA and CTV for vascular findings. Electronically Signed   By: Feliberto Harts MD   On: 08/11/2020 14:39   DG Abd Portable 1V  Result Date: 08/11/2020 CLINICAL DATA:  40 year old female status post orogastric tube placement. EXAM: PORTABLE ABDOMEN - 1 VIEW COMPARISON:  Abdominal radiograph 08/10/2020. FINDINGS: Enteric tube noted with tip and side port in the body of the stomach. The bowel gas pattern is normal. No radio-opaque calculi or other significant radiographic abnormality are seen. IMPRESSION: 1. Enteric tube positioned with tip and side port in the body of the stomach. 2. Nonobstructive bowel gas pattern. Electronically Signed   By: Trudie Reed M.D.   On: 08/11/2020 12:53   EEG adult  Result Date: 08/11/2020 Charlsie Quest, MD     08/11/2020  3:49 PM Patient Name: Regina Morris MRN: [id number] Epilepsy Attending: Charlsie Quest Referring Physician/Provider: Dr Marvel Plan Date: 08/11/2020 Duration: 24.17 mins  Patient history: 40 year old female with right MCA infarct with hemorrhagic transformation with worsening mental status.  EEG to evaluate for seizures.  Level of alertness:  Comatose  AEDs during EEG study: Keppra  Technical aspects: This EEG study was done with scalp electrodes positioned according to the 10-20 International system of electrode placement. Electrical activity was acquired at a sampling rate of 500Hz  and reviewed with a high frequency filter of 70Hz  and a low frequency filter of 1Hz . EEG data were recorded continuously and digitally stored.  Description: EEG showed continuous generalized 3 to 5 Hz theta-delta slowing. Run of sharp waves was noted in right frontocentral region without definite evolution suggestive of brief ictal-interictal rhythmic discharges.  Hyperventilation and photic stimulation were not performed.    ABNORMALITY -Brief ictal-interictal rhythmic discharges, right frontocentral region  -Continuous slow, generalized  IMPRESSION: This study showed evidence of epileptogenicity arising from right frontocentral region with high potential for seizures.  Additionally there is severe diffuse encephalopathy, nonspecific etiology. No definite seizures  were seen throughout the recording.     Assessment/Plan: 40 y.o. female with right posterior MCA infarct with hemorrhagic conversion who is post op day #4 s/p right decompressive hemicraniectomy. Patient with decline in neurological exam ovenight. Heparin gtt stopped. Repeat CTH revealed no new intracranial abnormality. Her exam and prognosis continue to be poor. No new neurosurgical recommendations at this time. Continue supportive care  LOS: 6 days     , DNP, NP-C 08/13/2020, 7:47 AM

## 2020-08-13 NOTE — Progress Notes (Signed)
ANTICOAGULATION CONSULT NOTE  Pharmacy Consult:  IV Heparin Indication:  Cerebral venous sinus thrombosis  Labs: Recent Labs    08/11/20 0408 08/11/20 0425 08/12/20 0405 08/12/20 0409 08/12/20 1219 08/12/20 1921 08/13/20 0327 08/13/20 1755  HGB 7.4*   < > 7.5* 7.4*  --   --  7.2*  --   HCT 26.8*   < > 22.0* 26.4*  --   --  25.6*  --   PLT 286  --   --  291  --   --  257  --   HEPARINUNFRC  --    < >  --  0.12*   < > 0.23* 0.40 0.39  CREATININE 0.73  --   --  0.66  --   --  0.67  --    < > = values in this interval not displayed.    Assessment: 40 yr old female presented with nausea, vomiting and right temple headache and was found to have hemorrhagic R MCA infarct with midline shift and mass effect.  Most recent CT on 5/29 showed progressive edema with newly-seen subcortical hemorrhage in the left frontal region.  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  Pharmacy was consulted to dose IV heparin, low goal and no bolus.   Heparin was held this morning due to concern for fixed pupils. A stat head CT was stable - therefore, the decision was made to resume heparin. Heparin was restarted at the previously therapeutic rate of 1400 units/hr around 1200.   Heparin level ~7 hrs after heparin infusion was restarted at 1400 units/hr was 0.39 units/ml, which is within the goal range for this pt. H/H 7.3/25.6, plt 257. Per RN, no issues with IV or bleeding observed.  CT this afternoon: no new bleeding or infarct, but increased cerebral edema    Goal of Therapy:  Heparin level 0.3-0.5 units/ml Monitor platelets by anticoagulation protocol: Yes   Plan:  Continue heparin infusion at 1400 units/hr (no bolus and aiming for low goal 0.3-0.5 units/ml) Check confirmatory heparin level in 6 hrs Monitor daily heparin level, CBC Monitor for bleeding  Thank you for allowing pharmacy to be a part of this patient's care.  Vicki Mallet, PharmD, BCPS, [facility] Clinical  Pharmacist 08/13/2020 7:31 PM

## 2020-08-13 DEATH — deceased

## 2020-08-14 ENCOUNTER — Inpatient Hospital Stay (HOSPITAL_COMMUNITY): Payer: Self-pay

## 2020-08-14 ENCOUNTER — Other Ambulatory Visit (HOSPITAL_COMMUNITY): Payer: Self-pay

## 2020-08-14 DIAGNOSIS — R5081 Fever presenting with conditions classified elsewhere: Secondary | ICD-10-CM

## 2020-08-14 DIAGNOSIS — I63429 Cerebral infarction due to embolism of unspecified anterior cerebral artery: Secondary | ICD-10-CM

## 2020-08-14 LAB — PROTIME-INR
INR: 1.3 — ABNORMAL HIGH (ref 0.8–1.2)
Prothrombin Time: 16 seconds — ABNORMAL HIGH (ref 11.4–15.2)

## 2020-08-14 LAB — POCT I-STAT 7, (LYTES, BLD GAS, ICA,H+H)
Acid-Base Excess: 10 mmol/L — ABNORMAL HIGH (ref 0.0–2.0)
Acid-Base Excess: 9 mmol/L — ABNORMAL HIGH (ref 0.0–2.0)
Bicarbonate: 37.1 mmol/L — ABNORMAL HIGH (ref 20.0–28.0)
Bicarbonate: 35.3 mmol/L — ABNORMAL HIGH (ref 20.0–28.0)
Calcium, Ion: 1.25 mmol/L (ref 1.15–1.40)
Calcium, Ion: 1.24 mmol/L (ref 1.15–1.40)
HCT: 21 % — ABNORMAL LOW (ref 36.0–46.0)
HCT: 22 % — ABNORMAL LOW (ref 36.0–46.0)
Hemoglobin: 7.1 g/dL — ABNORMAL LOW (ref 12.0–15.0)
Hemoglobin: 7.5 g/dL — ABNORMAL LOW (ref 12.0–15.0)
O2 Saturation: 100 %
O2 Saturation: 99 %
Patient temperature: 98.5
Patient temperature: 98.4
Potassium: 3.7 mmol/L (ref 3.5–5.1)
Potassium: 3.8 mmol/L (ref 3.5–5.1)
Sodium: 171 mmol/L (ref 135–145)
Sodium: 170 mmol/L (ref 135–145)
TCO2: 39 mmol/L — ABNORMAL HIGH (ref 22–32)
TCO2: 37 mmol/L — ABNORMAL HIGH (ref 22–32)
pCO2 arterial: 70.3 mmHg (ref 32.0–48.0)
pCO2 arterial: 62.7 mmHg — ABNORMAL HIGH (ref 32.0–48.0)
pH, Arterial: 7.331 — ABNORMAL LOW (ref 7.350–7.450)
pH, Arterial: 7.358 (ref 7.350–7.450)
pO2, Arterial: 324 mmHg — ABNORMAL HIGH (ref 83.0–108.0)
pO2, Arterial: 126 mmHg — ABNORMAL HIGH (ref 83.0–108.0)

## 2020-08-14 LAB — CBC
HCT: 29 % — ABNORMAL LOW (ref 36.0–46.0)
HCT: 26.1 % — ABNORMAL LOW (ref 36.0–46.0)
Hemoglobin: 7.5 g/dL — ABNORMAL LOW (ref 12.0–15.0)
Hemoglobin: 6.4 g/dL — CL (ref 12.0–15.0)
MCH: 18.6 pg — ABNORMAL LOW (ref 26.0–34.0)
MCH: 18.6 pg — ABNORMAL LOW (ref 26.0–34.0)
MCHC: 25.9 g/dL — ABNORMAL LOW (ref 30.0–36.0)
MCHC: 24.5 g/dL — ABNORMAL LOW (ref 30.0–36.0)
MCV: 71.8 fL — ABNORMAL LOW (ref 80.0–100.0)
MCV: 75.9 fL — ABNORMAL LOW (ref 80.0–100.0)
Platelets: 326 10*3/uL (ref 150–400)
Platelets: 297 10*3/uL (ref 150–400)
RBC: 4.04 MIL/uL (ref 3.87–5.11)
RBC: 3.44 MIL/uL — ABNORMAL LOW (ref 3.87–5.11)
RDW: 25.6 % — ABNORMAL HIGH (ref 11.5–15.5)
RDW: 25.6 % — ABNORMAL HIGH (ref 11.5–15.5)
WBC: 14.4 10*3/uL — ABNORMAL HIGH (ref 4.0–10.5)
WBC: 12.5 10*3/uL — ABNORMAL HIGH (ref 4.0–10.5)
nRBC: 0.3 % — ABNORMAL HIGH (ref 0.0–0.2)
nRBC: 0.8 % — ABNORMAL HIGH (ref 0.0–0.2)

## 2020-08-14 LAB — DIFFERENTIAL
Abs Immature Granulocytes: 0.09 10*3/uL — ABNORMAL HIGH (ref 0.00–0.07)
Basophils Absolute: 0 10*3/uL (ref 0.0–0.1)
Basophils Relative: 0 %
Eosinophils Absolute: 0.1 10*3/uL (ref 0.0–0.5)
Eosinophils Relative: 1 %
Immature Granulocytes: 1 %
Lymphocytes Relative: 38 %
Lymphs Abs: 4.8 10*3/uL — ABNORMAL HIGH (ref 0.7–4.0)
Monocytes Absolute: 0.6 10*3/uL (ref 0.1–1.0)
Monocytes Relative: 5 %
Neutro Abs: 6.9 10*3/uL (ref 1.7–7.7)
Neutrophils Relative %: 55 %

## 2020-08-14 LAB — PHOSPHORUS
Phosphorus: 7 mg/dL — ABNORMAL HIGH (ref 2.5–4.6)
Phosphorus: 3.7 mg/dL (ref 2.5–4.6)

## 2020-08-14 LAB — TYPE AND SCREEN
ABO/RH(D): A POS
Antibody Screen: NEGATIVE
PT AG Type: POSITIVE
Unit division: 0

## 2020-08-14 LAB — BPAM RBC
Blood Product Expiration Date: 202206142359
ISSUE DATE / TIME: 202205290919
Unit Type and Rh: 6200

## 2020-08-14 LAB — BASIC METABOLIC PANEL
BUN: 19 mg/dL (ref 6–20)
CO2: 29 mmol/L (ref 22–32)
Calcium: 8.4 mg/dL — ABNORMAL LOW (ref 8.9–10.3)
Chloride: >130 mmol/L (ref 98–111)
Creatinine, Ser: 1.21 mg/dL — ABNORMAL HIGH (ref 0.44–1.00)
GFR, Estimated: 59 mL/min — ABNORMAL LOW (ref >60)
Glucose, Bld: 286 mg/dL — ABNORMAL HIGH (ref 70–99)
Potassium: 4.2 mmol/L (ref 3.5–5.1)
Sodium: 170 mmol/L (ref 135–145)

## 2020-08-14 LAB — COMPREHENSIVE METABOLIC PANEL
ALT: 15 U/L (ref 0–44)
AST: 16 U/L (ref 15–41)
Albumin: 1.9 g/dL — ABNORMAL LOW (ref 3.5–5.0)
Alkaline Phosphatase: 61 U/L (ref 38–126)
Anion gap: 4 — ABNORMAL LOW (ref 5–15)
BUN: 16 mg/dL (ref 6–20)
CO2: 36 mmol/L — ABNORMAL HIGH (ref 22–32)
Calcium: 8.4 mg/dL — ABNORMAL LOW (ref 8.9–10.3)
Chloride: 129 mmol/L — ABNORMAL HIGH (ref 98–111)
Creatinine, Ser: 0.96 mg/dL (ref 0.44–1.00)
GFR, Estimated: >60 mL/min (ref >60)
Glucose, Bld: 202 mg/dL — ABNORMAL HIGH (ref 70–99)
Potassium: 3.9 mmol/L (ref 3.5–5.1)
Sodium: 169 mmol/L (ref 135–145)
Total Bilirubin: 0.3 mg/dL (ref 0.3–1.2)
Total Protein: 5.9 g/dL — ABNORMAL LOW (ref 6.5–8.1)

## 2020-08-14 LAB — GLUCOSE, CAPILLARY
Glucose-Capillary: 279 mg/dL — ABNORMAL HIGH (ref 70–99)
Glucose-Capillary: 373 mg/dL — ABNORMAL HIGH (ref 70–99)
Glucose-Capillary: 287 mg/dL — ABNORMAL HIGH (ref 70–99)
Glucose-Capillary: 241 mg/dL — ABNORMAL HIGH (ref 70–99)
Glucose-Capillary: 156 mg/dL — ABNORMAL HIGH (ref 70–99)

## 2020-08-14 LAB — MAGNESIUM
Magnesium: 2.7 mg/dL — ABNORMAL HIGH (ref 1.7–2.4)
Magnesium: 2.7 mg/dL — ABNORMAL HIGH (ref 1.7–2.4)

## 2020-08-14 LAB — SODIUM
Sodium: 163 mmol/L (ref 135–145)
Sodium: 166 mmol/L (ref 135–145)

## 2020-08-14 LAB — TROPONIN I (HIGH SENSITIVITY): Troponin I (High Sensitivity): 8 ng/L (ref <18)

## 2020-08-14 LAB — HEPARIN LEVEL (UNFRACTIONATED)
Heparin Unfractionated: 0.71 IU/mL — ABNORMAL HIGH (ref 0.30–0.70)
Heparin Unfractionated: 0.87 IU/mL — ABNORMAL HIGH (ref 0.30–0.70)

## 2020-08-14 LAB — CK TOTAL AND CKMB (NOT AT ARMC)
CK, MB: 4 ng/mL (ref 0.5–5.0)
Relative Index: 2.8 — ABNORMAL HIGH (ref 0.0–2.5)
Total CK: 142 U/L (ref 38–234)

## 2020-08-14 LAB — D-DIMER, QUANTITATIVE: D-Dimer, Quant: 1.86 ug/mL-FEU — ABNORMAL HIGH (ref 0.00–0.50)

## 2020-08-14 LAB — LACTATE DEHYDROGENASE: LDH: 245 U/L — ABNORMAL HIGH (ref 98–192)

## 2020-08-14 LAB — OSMOLALITY, URINE
Osmolality, Ur: 770 mOsm/kg (ref 300–900)
Osmolality, Ur: 524 mOsm/kg (ref 300–900)

## 2020-08-14 LAB — AMYLASE: Amylase: 22 U/L — ABNORMAL LOW (ref 28–100)

## 2020-08-14 LAB — GAMMA GT: GGT: 16 U/L (ref 7–50)

## 2020-08-14 LAB — APTT: aPTT: 37 seconds — ABNORMAL HIGH (ref 24–36)

## 2020-08-14 LAB — FIBRINOGEN: Fibrinogen: >800 mg/dL — ABNORMAL HIGH (ref 210–475)

## 2020-08-14 LAB — PREPARE RBC (CROSSMATCH)

## 2020-08-14 LAB — BILIRUBIN, DIRECT: Bilirubin, Direct: <0.1 mg/dL (ref 0.0–0.2)

## 2020-08-14 LAB — FACTOR 5 LEIDEN

## 2020-08-14 LAB — LACTIC ACID, PLASMA: Lactic Acid, Venous: 1.2 mmol/L (ref 0.5–1.9)

## 2020-08-14 IMAGING — CT CT CHEST-ABD-PELV W/O CM
2 of 4 series · 14 of 46 positions shown, 16 images · non-contrast
Comparison: Chest radiograph earlier today. Abdominopelvic CT
[DATE]

CLINICAL DATA: Cardiac transplant donor assessment. Please include
liver measurements.

EXAM:
CT CHEST, ABDOMEN AND PELVIS WITHOUT CONTRAST
TECHNIQUE: Multidetector CT imaging of the chest, abdomen and pelvis was
performed following the standard protocol without IV contrast.

[Series 3: cap without · axial · non-contrast · 0.74mm/px · z∈[-685,-120]mm · 11 of 129 slices shown, 13 images]
[im 8/129  soft-tissue]
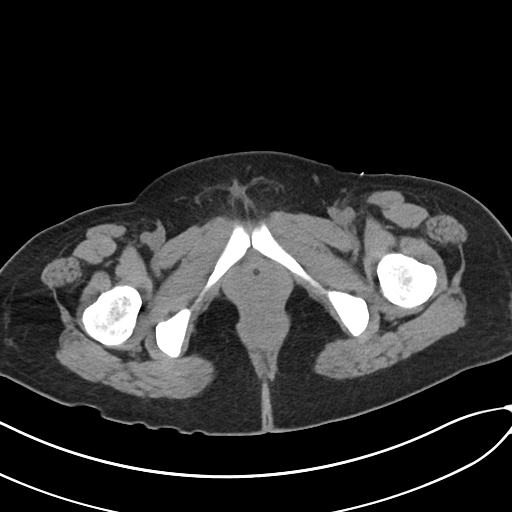
[im 8/129  bone]
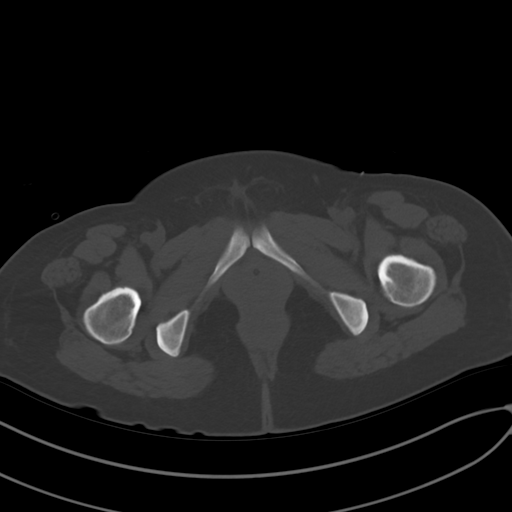
[im 23/129  soft-tissue]
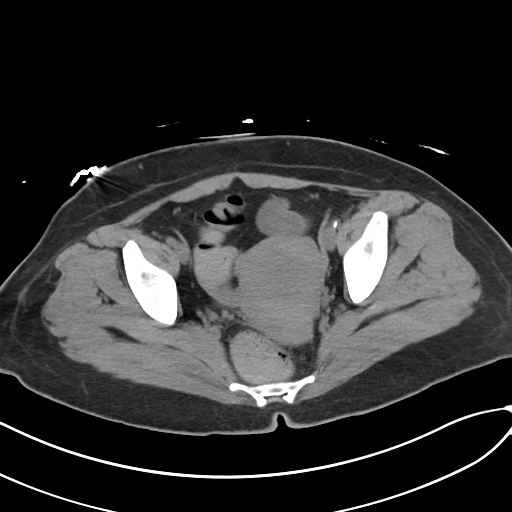
[im 31/129  soft-tissue]
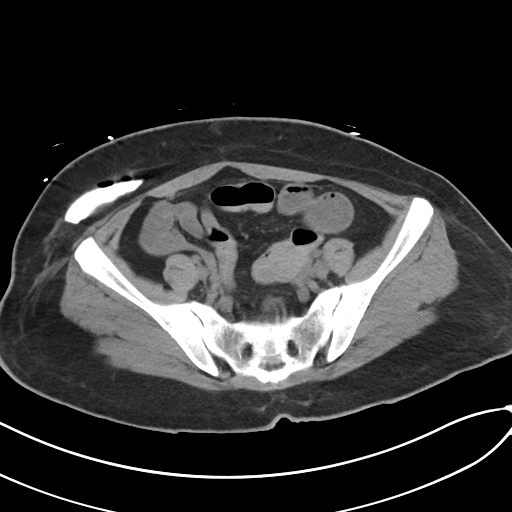
[im 46/129  soft-tissue]
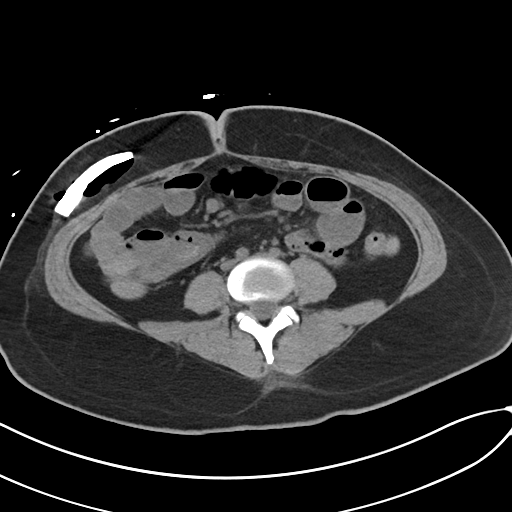
[im 53/129  soft-tissue]
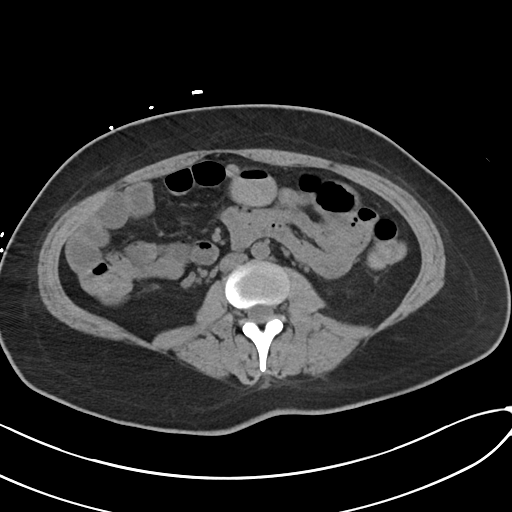
[im 68/129  soft-tissue]
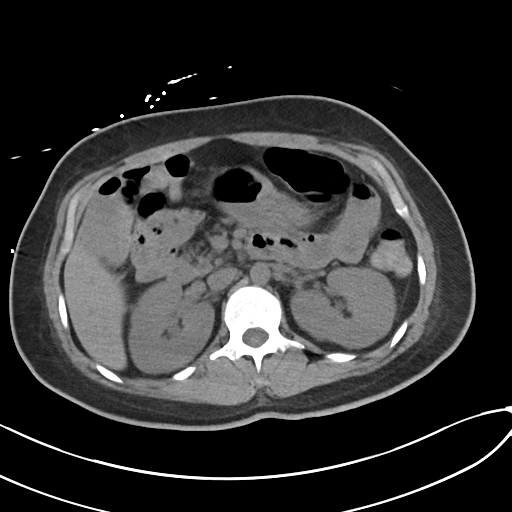
[im 76/129  soft-tissue]
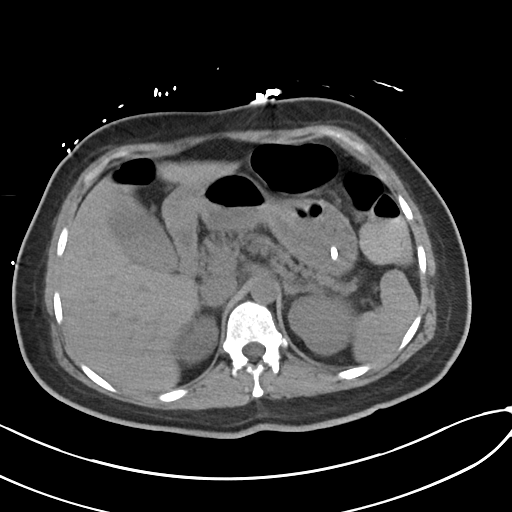
[im 83/129  soft-tissue]
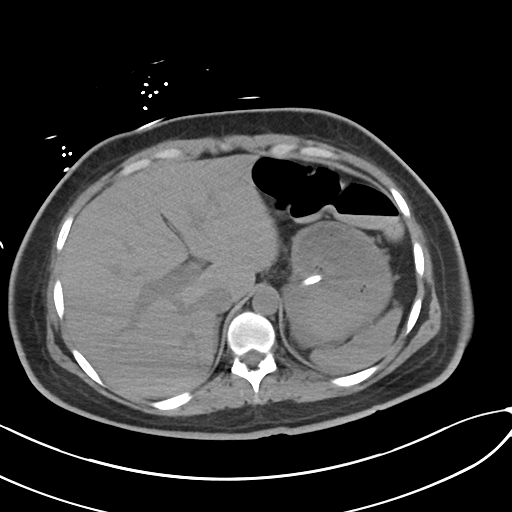
[im 98/129  soft-tissue]
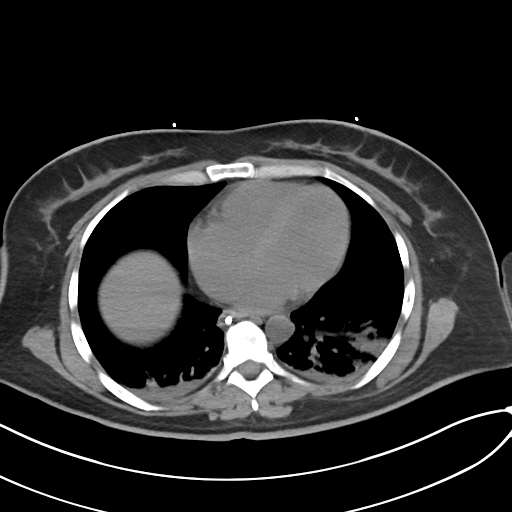
[im 98/129  bone]
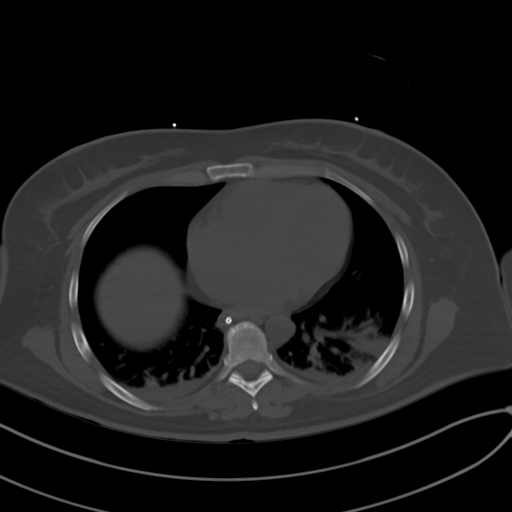
[im 106/129  soft-tissue]
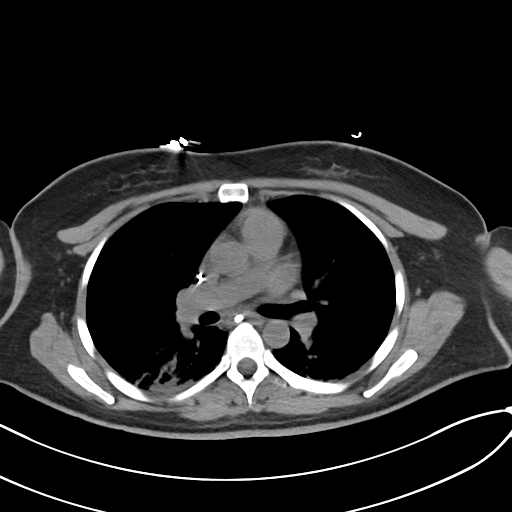
[im 121/129  soft-tissue]
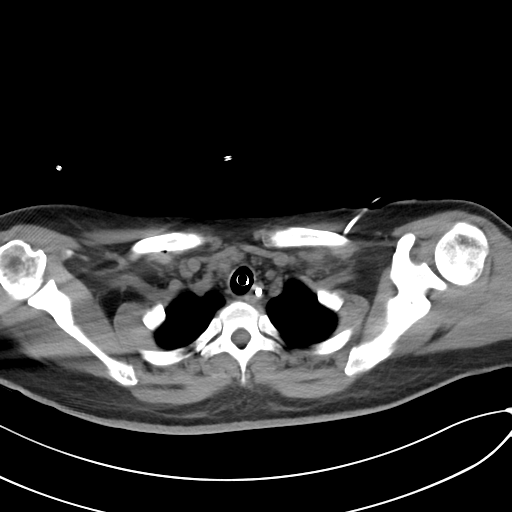

[Series 5: cor · coronal · 0.87mm/px · 3 of 86 slices shown]
[im 29/86  soft-tissue]
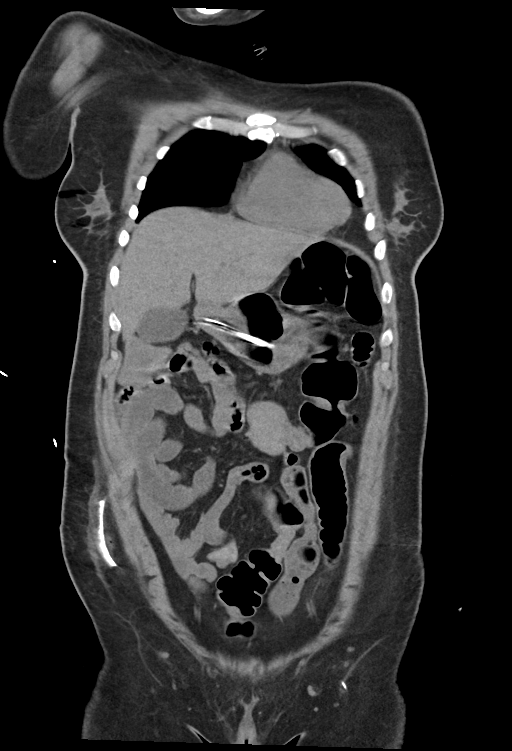
[im 38/86  soft-tissue]
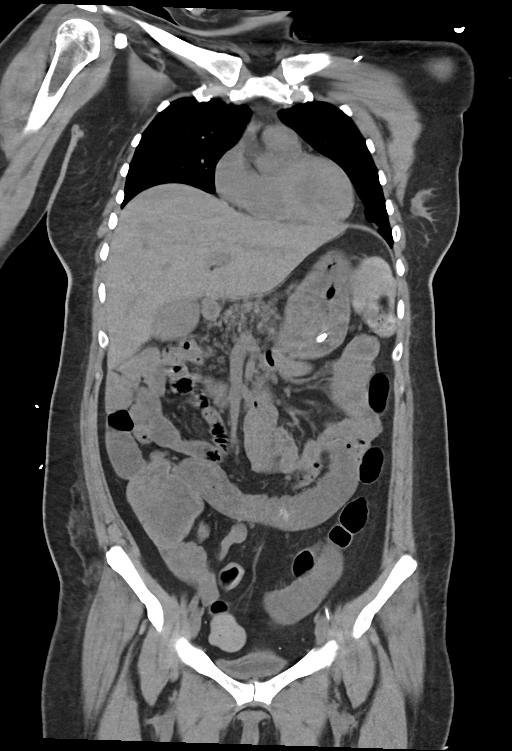
[im 48/86  soft-tissue]
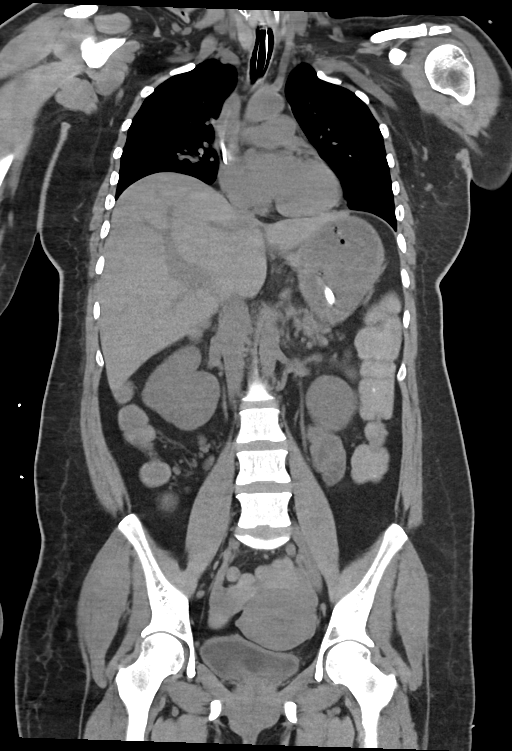

[14 of 46 positions shown; findings below may reference images not displayed]

FINDINGS: CT CHEST FINDINGS

Cardiovascular: Left subclavian central venous catheter tip in the
lower SVC. The heart is normal in size. No pericardial effusion.
Thoracic aorta is normal in caliber. The aorta at the aortic root
measures 2.6 cm, mid ascending aorta 2.8 cm, distal ascending aorta
just before the brachiocephalic take of 2.5 cm. Transverse aorta
measures 2.1 cm, descending aorta at 2.2 cm. Conventional branching
pattern from the aortic arch. No significant atherosclerosis. No
visualized coronary artery calcifications.

Mediastinum/Nodes: Endotracheal tube tip above the carina. Enteric
tube decompressing the esophagus. There is no mediastinal
adenopathy. No visualized thyroid nodule. No axillary adenopathy.

Lungs/Pleura: Dependent opacities within both lower lobes, slightly
nodular in appearance. Trace pleural thickening without significant
effusion. 3 mm right middle lobe pulmonary nodule. Trachea and
central bronchi are patent.

Musculoskeletal: There are no acute or suspicious osseous
abnormalities.

CT ABDOMEN PELVIS FINDINGS

Hepatobiliary: Homogeneous density on this noncontrast exam. No
steatosis. Right lobe spans 19 cm in cranial caudal dimension. Left
lobe spans 7 cm cranial caudal. Transverse dimension of 18.2 cm. AP
dimension just below the main portal vein of 15.9 cm. Unremarkable
gallbladder. No biliary dilatation.

Pancreas: Slight fatty atrophy of the head and proximal body. No
ductal dilatation or inflammation. No evidence of focal lesion.

Spleen: Normal in size without focal abnormality.

Adrenals/Urinary Tract: Normal adrenal glands. No hydronephrosis or
renal calculi. No evidence of focal renal lesion. The right kidney
measures 11.6 cm in sagittal dimension. The left kidney measures 12
cm in sagittal dimension. Decompressed urinary bladder by Foley
catheter.

Stomach/Bowel: Enteric tube within the stomach. Fluid/ingested
material within the gastric body. Occasional fluid-filled loops of
small bowel and colon without colonic wall thickening or
inflammation. Normal appendix. No obstruction.

Vascular/Lymphatic: Normal caliber abdominal aorta. No portal venous
or mesenteric gas. No enlarged lymph nodes in the abdomen or pelvis.
Left femoral catheter tip in the external iliac vein.

Reproductive: Uterus and bilateral adnexa are unremarkable.

Other: Trace free fluid in the pelvis. No free air. Skull flap in
the right abdominal subcutaneous tissues.

Musculoskeletal: There are no acute or suspicious osseous
abnormalities. Mild sclerosis about the iliac aspects of both
sacroiliac joints.
IMPRESSION: 1. Normal heart size.  Normal caliber thoracic aorta.
2. Dependent opacities within both lower lobes, slightly nodular in
appearance, may be atelectasis or aspiration.
3. Liver measurements as described above.

## 2020-08-14 IMAGING — NM NM BRAIN 4+V
7 series · 12 of 12 positions shown · non-contrast
Comparison: CT head [DATE]

CLINICAL DATA: Hemorrhagic stroke, clinical brain death

EXAM:
NM BRAIN SCAN - 4+ VIEW
TECHNIQUE: Radionuclide angiogram and static images of the brain were obtained
after intravenous injection of radiopharmaceutical.
RADIOPHARMACEUTICALS:  21.8 millicuries [5W] HMPAO IV

[br brain · 4.52mm/px · 6 of 30 frames shown (1 of 7)]
[frame 3/30]
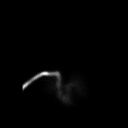
[frame 8/30]
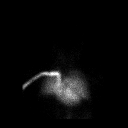
[frame 13/30]
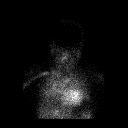
[frame 18/30]
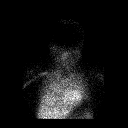
[frame 23/30]
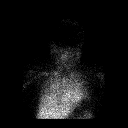
[frame 28/30]
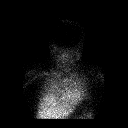

[br brain · 2.26mm/px · 1 of 1 slices shown (2 of 7)]
[im 1/1]
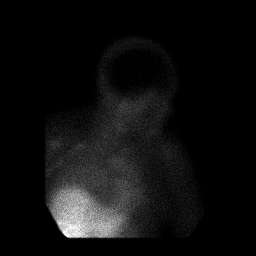

[br brain · 2.26mm/px · 1 of 1 slices shown (3 of 7)]
[im 1/1]
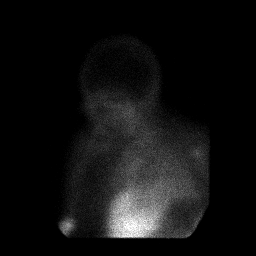

[br brain · 2.26mm/px · 1 of 1 slices shown (4 of 7)]
[im 1/1]
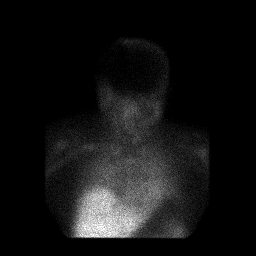

[br brain · 2.26mm/px · 1 of 1 slices shown (5 of 7)]
[im 1/1]
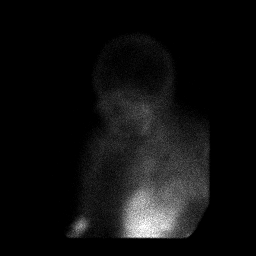

[br brain · 2.26mm/px · 1 of 1 slices shown (6 of 7)]
[im 1/1]
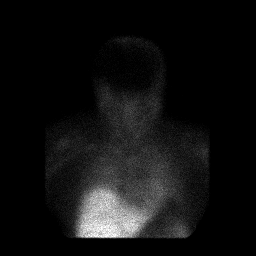

[br brain · 2.26mm/px · 1 of 1 slices shown (7 of 7)]
[im 1/1]
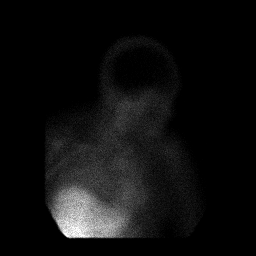

[12 of 12 positions shown; findings below may reference images not displayed]

FINDINGS: Blood flow images demonstrate no intracranial perfusion.

Flow is identified to the scalp except for an area in the RIGHT
parietal region corresponding to site of craniectomy and marked
cerebral swelling.

Delayed static images show no localization of tracer within brain
parenchyma.

Findings are consistent with brain death.
IMPRESSION: No intracranial blood flow or brain uptake of tracer.

Findings are consistent with brain death.

## 2020-08-14 IMAGING — DX DG CHEST 1V PORT
1 series · 1 of 1 positions shown · non-contrast
Comparison: [DATE]

CLINICAL DATA: Central line placement, history of hemorrhagic
stroke

EXAM:
PORTABLE CHEST 1 VIEW

[chest]
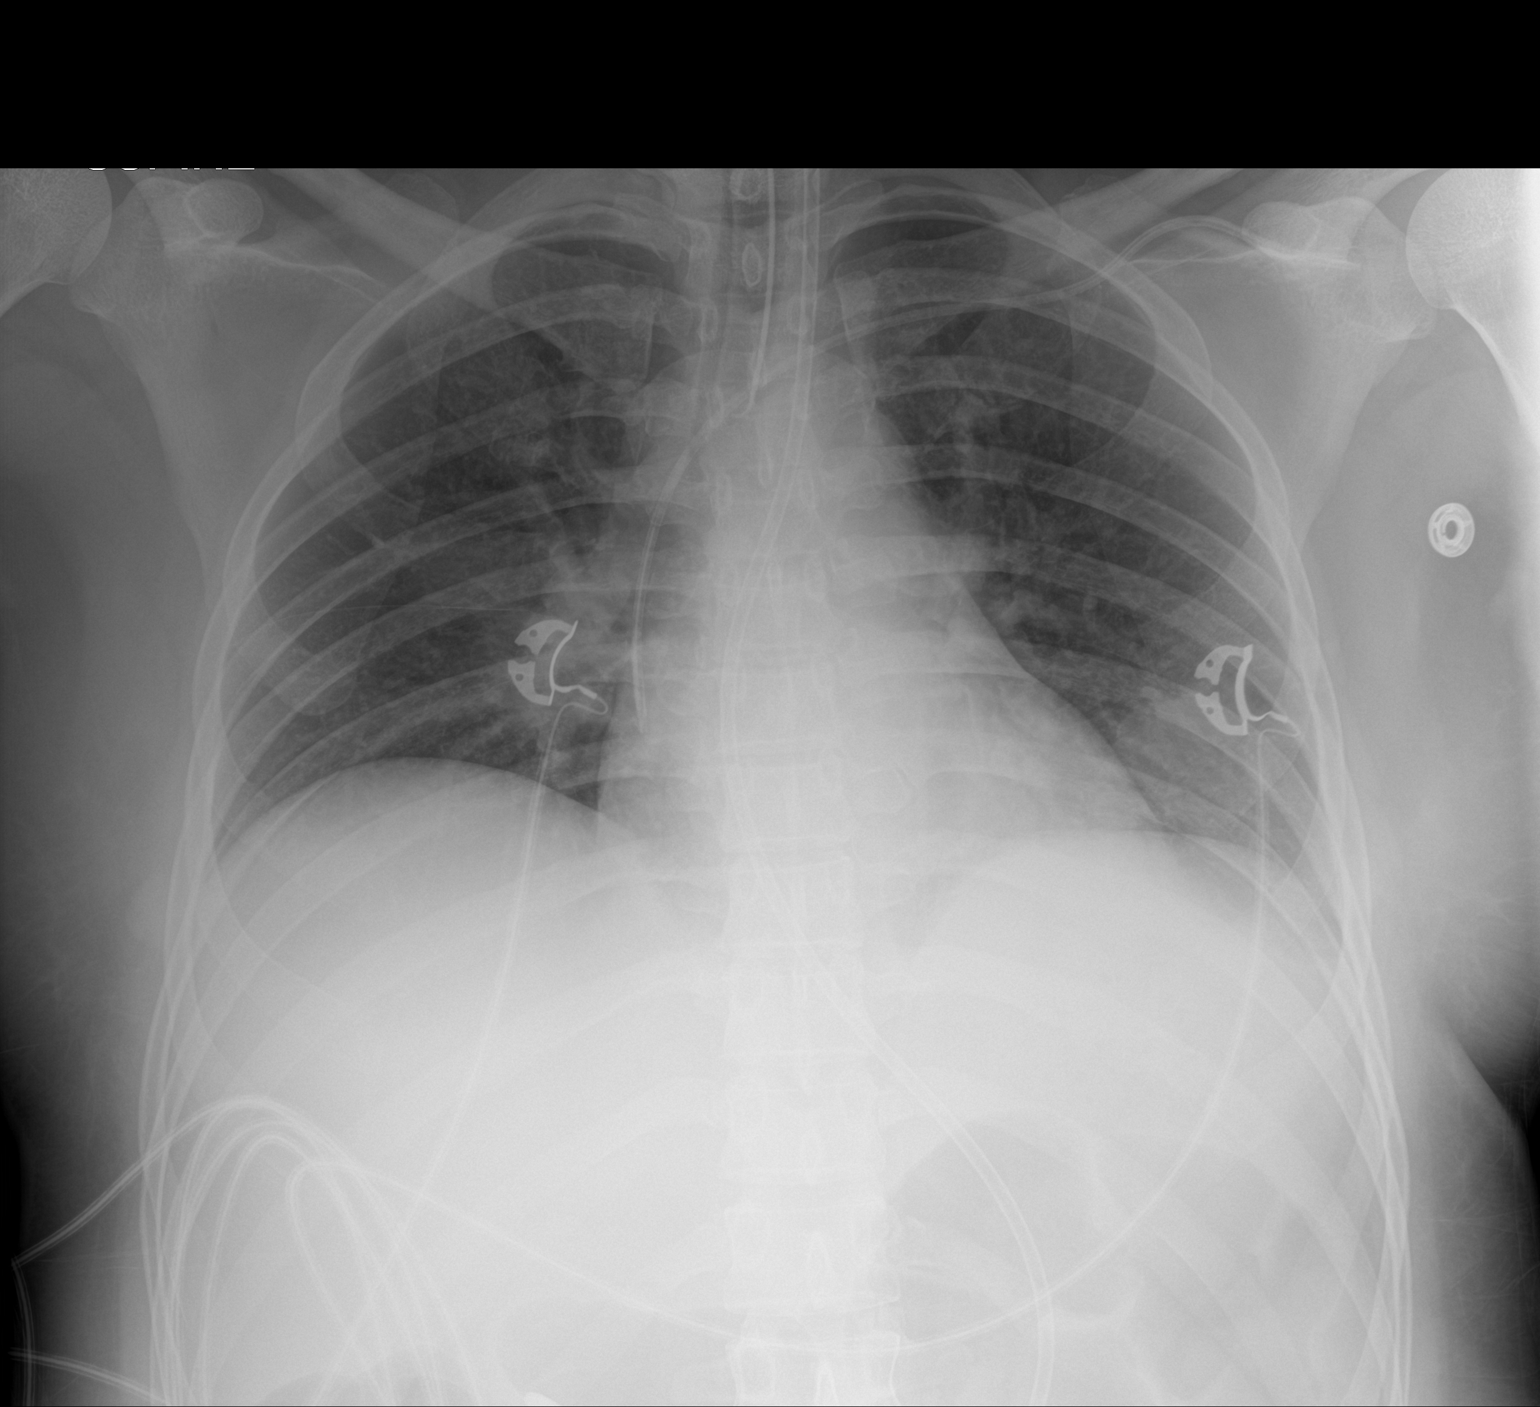

[1 of 1 positions shown; findings below may reference images not displayed]

FINDINGS: Single frontal view of the chest demonstrates stable endotracheal
tube and enteric catheter. Left subclavian central venous catheter
tip overlies the atriocaval junction. The cardiac silhouette is
unremarkable. No airspace disease, effusion, or pneumothorax. No
acute bony abnormalities.
IMPRESSION: 1. No complication after central venous catheter placement. No acute
intrathoracic process.

## 2020-08-14 MED ORDER — NOREPINEPHRINE 4 MG/250ML-% IV SOLN
INTRAVENOUS | Status: AC
  Filled 2020-08-14: qty 250

## 2020-08-14 MED ORDER — DEXTROSE 50 % IV SOLN
50.0000 mL | Freq: Once | INTRAVENOUS | Status: AC
  Administered 2020-08-14: 50 mL via INTRAVENOUS
  Filled 2020-08-14: qty 50

## 2020-08-14 MED ORDER — LACTATED RINGERS IV SOLN: INTRAVENOUS | Status: DC

## 2020-08-14 MED ORDER — LACTATED RINGERS IV BOLUS
1000.0000 mL | Freq: Once | INTRAVENOUS | Status: AC
  Administered 2020-08-14: 1000 mL via INTRAVENOUS

## 2020-08-14 MED ORDER — MANNITOL 25 % IV SOLN
INTRAVENOUS | Status: AC
  Filled 2020-08-14: qty 50

## 2020-08-14 MED ORDER — SODIUM CHLORIDE 0.9 % IV SOLN
1000.0000 mg | Freq: Once | INTRAVENOUS | Status: AC
  Administered 2020-08-14: 1000 mg via INTRAVENOUS
  Filled 2020-08-14: qty 8

## 2020-08-14 MED ORDER — MANNITOL 20 % IV SOLN
75.0000 g | Freq: Once | Status: AC
  Administered 2020-08-14: 75 g via INTRAVENOUS
  Filled 2020-08-14: qty 375

## 2020-08-14 MED ORDER — LEVOTHYROXINE SODIUM 100 MCG/5ML IV SOLN
20.0000 ug | Freq: Once | INTRAVENOUS | Status: AC
  Administered 2020-08-14: 20 ug via INTRAVENOUS
  Filled 2020-08-14: qty 5

## 2020-08-14 MED ORDER — NOREPINEPHRINE 4 MG/250ML-% IV SOLN
0.0000 ug/min | INTRAVENOUS | Status: DC
  Administered 2020-08-14: 4 ug/min via INTRAVENOUS
  Administered 2020-08-14: 10 ug/min via INTRAVENOUS
  Administered 2020-08-14: 28 ug/min via INTRAVENOUS
  Administered 2020-08-14: 12 ug/min via INTRAVENOUS
  Administered 2020-08-14 (×2): 28 ug/min via INTRAVENOUS
  Administered 2020-08-16: 2 ug/min via INTRAVENOUS
  Filled 2020-08-14 (×3): qty 250
  Filled 2020-08-14: qty 500
  Filled 2020-08-14 (×3): qty 250

## 2020-08-14 MED ORDER — FLUCONAZOLE IN SODIUM CHLORIDE 400-0.9 MG/200ML-% IV SOLN
400.0000 mg | Freq: Once | INTRAVENOUS | Status: AC
  Administered 2020-08-15: 400 mg via INTRAVENOUS
  Filled 2020-08-14: qty 200

## 2020-08-14 MED ORDER — VANCOMYCIN HCL 1000 MG/200ML IV SOLN
1000.0000 mg | Freq: Once | INTRAVENOUS | Status: AC
  Administered 2020-08-14: 1000 mg via INTRAVENOUS
  Filled 2020-08-14: qty 200

## 2020-08-14 MED ORDER — SODIUM CHLORIDE 0.9% IV SOLUTION: Freq: Once | INTRAVENOUS | Status: AC

## 2020-08-14 MED ORDER — SODIUM CHLORIDE 0.9 % IV SOLN: INTRAVENOUS | Status: DC | PRN

## 2020-08-14 MED ORDER — TECHNETIUM TC 99M EXAMETAZIME IV KIT
21.8000 | PACK | Freq: Once | INTRAVENOUS | Status: AC | PRN
  Administered 2020-08-14: 21.8 via INTRAVENOUS

## 2020-08-14 MED ORDER — MANNITOL 20 % IV SOLN: 50.0000 g | Freq: Once | Status: DC

## 2020-08-14 MED ORDER — CIPROFLOXACIN IN D5W 400 MG/200ML IV SOLN
400.0000 mg | Freq: Once | INTRAVENOUS | Status: AC
  Administered 2020-08-14: 400 mg via INTRAVENOUS
  Filled 2020-08-14: qty 200

## 2020-08-14 MED ORDER — INSULIN ASPART 100 UNIT/ML IJ SOLN
20.0000 [IU] | Freq: Once | INTRAMUSCULAR | Status: AC
  Administered 2020-08-14: 20 [IU] via SUBCUTANEOUS

## 2020-08-14 MED ORDER — SODIUM CHLORIDE 0.9 % IV SOLN
10.0000 ug/h | INTRAVENOUS | Status: DC
  Administered 2020-08-14 – 2020-08-16 (×3): 20 ug/h via INTRAVENOUS
  Filled 2020-08-14 (×6): qty 10

## 2020-08-15 ENCOUNTER — Other Ambulatory Visit (HOSPITAL_COMMUNITY): Payer: Self-pay

## 2020-08-15 LAB — POCT I-STAT 7, (LYTES, BLD GAS, ICA,H+H)
Acid-Base Excess: 7 mmol/L — ABNORMAL HIGH (ref 0.0–2.0)
Acid-Base Excess: 12 mmol/L — ABNORMAL HIGH (ref 0.0–2.0)
Acid-Base Excess: 12 mmol/L — ABNORMAL HIGH (ref 0.0–2.0)
Acid-Base Excess: 11 mmol/L — ABNORMAL HIGH (ref 0.0–2.0)
Acid-Base Excess: 11 mmol/L — ABNORMAL HIGH (ref 0.0–2.0)
Acid-Base Excess: 12 mmol/L — ABNORMAL HIGH (ref 0.0–2.0)
Bicarbonate: 31.8 mmol/L — ABNORMAL HIGH (ref 20.0–28.0)
Bicarbonate: 36.2 mmol/L — ABNORMAL HIGH (ref 20.0–28.0)
Bicarbonate: 36 mmol/L — ABNORMAL HIGH (ref 20.0–28.0)
Bicarbonate: 34.2 mmol/L — ABNORMAL HIGH (ref 20.0–28.0)
Bicarbonate: 34.3 mmol/L — ABNORMAL HIGH (ref 20.0–28.0)
Bicarbonate: 35.5 mmol/L — ABNORMAL HIGH (ref 20.0–28.0)
Calcium, Ion: 1.24 mmol/L (ref 1.15–1.40)
Calcium, Ion: 1.22 mmol/L (ref 1.15–1.40)
Calcium, Ion: 1.27 mmol/L (ref 1.15–1.40)
Calcium, Ion: 1.17 mmol/L (ref 1.15–1.40)
Calcium, Ion: 1.17 mmol/L (ref 1.15–1.40)
Calcium, Ion: 1.18 mmol/L (ref 1.15–1.40)
HCT: 24 % — ABNORMAL LOW (ref 36.0–46.0)
HCT: 24 % — ABNORMAL LOW (ref 36.0–46.0)
HCT: 26 % — ABNORMAL LOW (ref 36.0–46.0)
HCT: 19 % — ABNORMAL LOW (ref 36.0–46.0)
HCT: 27 % — ABNORMAL LOW (ref 36.0–46.0)
HCT: 29 % — ABNORMAL LOW (ref 36.0–46.0)
Hemoglobin: 8.2 g/dL — ABNORMAL LOW (ref 12.0–15.0)
Hemoglobin: 8.2 g/dL — ABNORMAL LOW (ref 12.0–15.0)
Hemoglobin: 8.8 g/dL — ABNORMAL LOW (ref 12.0–15.0)
Hemoglobin: 6.5 g/dL — CL (ref 12.0–15.0)
Hemoglobin: 9.2 g/dL — ABNORMAL LOW (ref 12.0–15.0)
Hemoglobin: 9.9 g/dL — ABNORMAL LOW (ref 12.0–15.0)
O2 Saturation: 100 %
O2 Saturation: 100 %
O2 Saturation: 100 %
O2 Saturation: 100 %
O2 Saturation: 100 %
O2 Saturation: 100 %
Patient temperature: 98.3
Patient temperature: 98.3
Patient temperature: 99.7
Patient temperature: 98.5
Patient temperature: 98.5
Patient temperature: 98.3
Potassium: 2.9 mmol/L — ABNORMAL LOW (ref 3.5–5.1)
Potassium: 2.8 mmol/L — ABNORMAL LOW (ref 3.5–5.1)
Potassium: 3.1 mmol/L — ABNORMAL LOW (ref 3.5–5.1)
Potassium: 3.2 mmol/L — ABNORMAL LOW (ref 3.5–5.1)
Potassium: 3 mmol/L — ABNORMAL LOW (ref 3.5–5.1)
Potassium: 2.9 mmol/L — ABNORMAL LOW (ref 3.5–5.1)
Sodium: 168 mmol/L (ref 135–145)
Sodium: 172 mmol/L (ref 135–145)
Sodium: 170 mmol/L (ref 135–145)
Sodium: 172 mmol/L (ref 135–145)
Sodium: 171 mmol/L (ref 135–145)
Sodium: 170 mmol/L (ref 135–145)
TCO2: 33 mmol/L — ABNORMAL HIGH (ref 22–32)
TCO2: 38 mmol/L — ABNORMAL HIGH (ref 22–32)
TCO2: 37 mmol/L — ABNORMAL HIGH (ref 22–32)
TCO2: 35 mmol/L — ABNORMAL HIGH (ref 22–32)
TCO2: 35 mmol/L — ABNORMAL HIGH (ref 22–32)
TCO2: 37 mmol/L — ABNORMAL HIGH (ref 22–32)
pCO2 arterial: 46.2 mmHg (ref 32.0–48.0)
pCO2 arterial: 46.5 mmHg (ref 32.0–48.0)
pCO2 arterial: 44.1 mmHg (ref 32.0–48.0)
pCO2 arterial: 37.6 mmHg (ref 32.0–48.0)
pCO2 arterial: 39.4 mmHg (ref 32.0–48.0)
pCO2 arterial: 39.2 mmHg (ref 32.0–48.0)
pH, Arterial: 7.446 (ref 7.350–7.450)
pH, Arterial: 7.499 — ABNORMAL HIGH (ref 7.350–7.450)
pH, Arterial: 7.522 — ABNORMAL HIGH (ref 7.350–7.450)
pH, Arterial: 7.566 — ABNORMAL HIGH (ref 7.350–7.450)
pH, Arterial: 7.547 — ABNORMAL HIGH (ref 7.350–7.450)
pH, Arterial: 7.565 — ABNORMAL HIGH (ref 7.350–7.450)
pO2, Arterial: 344 mmHg — ABNORMAL HIGH (ref 83.0–108.0)
pO2, Arterial: 384 mmHg — ABNORMAL HIGH (ref 83.0–108.0)
pO2, Arterial: 389 mmHg — ABNORMAL HIGH (ref 83.0–108.0)
pO2, Arterial: 370 mmHg — ABNORMAL HIGH (ref 83.0–108.0)
pO2, Arterial: 430 mmHg — ABNORMAL HIGH (ref 83.0–108.0)
pO2, Arterial: 358 mmHg — ABNORMAL HIGH (ref 83.0–108.0)

## 2020-08-15 LAB — CBC
HCT: 29.4 % — ABNORMAL LOW (ref 36.0–46.0)
HCT: 27.6 % — ABNORMAL LOW (ref 36.0–46.0)
Hemoglobin: 7.9 g/dL — ABNORMAL LOW (ref 12.0–15.0)
Hemoglobin: 7.9 g/dL — ABNORMAL LOW (ref 12.0–15.0)
MCH: 20.2 pg — ABNORMAL LOW (ref 26.0–34.0)
MCH: 21.6 pg — ABNORMAL LOW (ref 26.0–34.0)
MCHC: 26.9 g/dL — ABNORMAL LOW (ref 30.0–36.0)
MCHC: 28.6 g/dL — ABNORMAL LOW (ref 30.0–36.0)
MCV: 75.2 fL — ABNORMAL LOW (ref 80.0–100.0)
MCV: 75.4 fL — ABNORMAL LOW (ref 80.0–100.0)
Platelets: 271 10*3/uL (ref 150–400)
Platelets: 205 10*3/uL (ref 150–400)
RBC: 3.91 MIL/uL (ref 3.87–5.11)
RBC: 3.66 MIL/uL — ABNORMAL LOW (ref 3.87–5.11)
RDW: 25.4 % — ABNORMAL HIGH (ref 11.5–15.5)
RDW: 26.9 % — ABNORMAL HIGH (ref 11.5–15.5)
WBC: 10 10*3/uL (ref 4.0–10.5)
WBC: 12 10*3/uL — ABNORMAL HIGH (ref 4.0–10.5)
nRBC: 0.6 % — ABNORMAL HIGH (ref 0.0–0.2)
nRBC: 0.8 % — ABNORMAL HIGH (ref 0.0–0.2)

## 2020-08-15 LAB — COMPREHENSIVE METABOLIC PANEL
ALT: 17 U/L (ref 0–44)
ALT: 18 U/L (ref 0–44)
AST: 19 U/L (ref 15–41)
AST: 22 U/L (ref 15–41)
Albumin: 2.3 g/dL — ABNORMAL LOW (ref 3.5–5.0)
Albumin: 1.9 g/dL — ABNORMAL LOW (ref 3.5–5.0)
Alkaline Phosphatase: 59 U/L (ref 38–126)
Alkaline Phosphatase: 64 U/L (ref 38–126)
Anion gap: UNDETERMINED (ref 5–15)
Anion gap: 7 (ref 5–15)
BUN: 16 mg/dL (ref 6–20)
BUN: 20 mg/dL (ref 6–20)
CO2: 31 mmol/L (ref 22–32)
CO2: 28 mmol/L (ref 22–32)
Calcium: 8.7 mg/dL — ABNORMAL LOW (ref 8.9–10.3)
Calcium: 8.1 mg/dL — ABNORMAL LOW (ref 8.9–10.3)
Chloride: >130 mmol/L (ref 98–111)
Chloride: 126 mmol/L — ABNORMAL HIGH (ref 98–111)
Creatinine, Ser: 1.03 mg/dL — ABNORMAL HIGH (ref 0.44–1.00)
Creatinine, Ser: 1.01 mg/dL — ABNORMAL HIGH (ref 0.44–1.00)
GFR, Estimated: >60 mL/min (ref >60)
GFR, Estimated: >60 mL/min (ref >60)
Glucose, Bld: 223 mg/dL — ABNORMAL HIGH (ref 70–99)
Glucose, Bld: 234 mg/dL — ABNORMAL HIGH (ref 70–99)
Potassium: 3.1 mmol/L — ABNORMAL LOW (ref 3.5–5.1)
Potassium: 2.8 mmol/L — ABNORMAL LOW (ref 3.5–5.1)
Sodium: 169 mmol/L (ref 135–145)
Sodium: 161 mmol/L (ref 135–145)
Total Bilirubin: 0.3 mg/dL (ref 0.3–1.2)
Total Bilirubin: 0.7 mg/dL (ref 0.3–1.2)
Total Protein: 6.3 g/dL — ABNORMAL LOW (ref 6.5–8.1)
Total Protein: 5.4 g/dL — ABNORMAL LOW (ref 6.5–8.1)

## 2020-08-15 LAB — CULTURE, BLOOD (ROUTINE X 2)
Culture: NO GROWTH
Culture: NO GROWTH
Special Requests: ADEQUATE
Special Requests: ADEQUATE

## 2020-08-15 LAB — CBC WITH DIFFERENTIAL/PLATELET
Abs Immature Granulocytes: 0.09 10*3/uL — ABNORMAL HIGH (ref 0.00–0.07)
Basophils Absolute: 0 10*3/uL (ref 0.0–0.1)
Basophils Relative: 0 %
Eosinophils Absolute: 0 10*3/uL (ref 0.0–0.5)
Eosinophils Relative: 0 %
HCT: 23.9 % — ABNORMAL LOW (ref 36.0–46.0)
Hemoglobin: 6.6 g/dL — CL (ref 12.0–15.0)
Immature Granulocytes: 1 %
Lymphocytes Relative: 13 %
Lymphs Abs: 1.3 10*3/uL (ref 0.7–4.0)
MCH: 20.4 pg — ABNORMAL LOW (ref 26.0–34.0)
MCHC: 27.6 g/dL — ABNORMAL LOW (ref 30.0–36.0)
MCV: 73.8 fL — ABNORMAL LOW (ref 80.0–100.0)
Monocytes Absolute: 0.2 10*3/uL (ref 0.1–1.0)
Monocytes Relative: 2 %
Neutro Abs: 8.5 10*3/uL — ABNORMAL HIGH (ref 1.7–7.7)
Neutrophils Relative %: 84 %
Platelets: 201 10*3/uL (ref 150–400)
RBC: 3.24 MIL/uL — ABNORMAL LOW (ref 3.87–5.11)
RDW: 25.4 % — ABNORMAL HIGH (ref 11.5–15.5)
WBC: 10.2 10*3/uL (ref 4.0–10.5)
nRBC: 0.4 % — ABNORMAL HIGH (ref 0.0–0.2)

## 2020-08-15 LAB — BASIC METABOLIC PANEL
BUN: 20 mg/dL (ref 6–20)
CO2: 31 mmol/L (ref 22–32)
Calcium: 8.3 mg/dL — ABNORMAL LOW (ref 8.9–10.3)
Chloride: >130 mmol/L (ref 98–111)
Creatinine, Ser: 0.98 mg/dL (ref 0.44–1.00)
GFR, Estimated: >60 mL/min (ref >60)
Glucose, Bld: 279 mg/dL — ABNORMAL HIGH (ref 70–99)
Potassium: 3.1 mmol/L — ABNORMAL LOW (ref 3.5–5.1)
Sodium: 166 mmol/L (ref 135–145)

## 2020-08-15 LAB — GLUCOSE, CAPILLARY
Glucose-Capillary: 171 mg/dL — ABNORMAL HIGH (ref 70–99)
Glucose-Capillary: 179 mg/dL — ABNORMAL HIGH (ref 70–99)
Glucose-Capillary: 216 mg/dL — ABNORMAL HIGH (ref 70–99)
Glucose-Capillary: 219 mg/dL — ABNORMAL HIGH (ref 70–99)
Glucose-Capillary: 225 mg/dL — ABNORMAL HIGH (ref 70–99)
Glucose-Capillary: 262 mg/dL — ABNORMAL HIGH (ref 70–99)

## 2020-08-15 LAB — PREPARE RBC (CROSSMATCH)

## 2020-08-15 LAB — SARS CORONAVIRUS 2 (TAT 6-24 HRS): SARS Coronavirus 2: NEGATIVE

## 2020-08-15 LAB — PHOSPHORUS: Phosphorus: 1.4 mg/dL — ABNORMAL LOW (ref 2.5–4.6)

## 2020-08-15 LAB — MAGNESIUM: Magnesium: 2.3 mg/dL (ref 1.7–2.4)

## 2020-08-15 LAB — MTHFR DNA ANALYSIS

## 2020-08-15 IMAGING — DX DG CHEST 1V PORT
1 series · 1 of 1 positions shown · non-contrast
Comparison: CT Chest, Abdomen, and Pelvis [DATE] and earlier.

CLINICAL DATA: 39-year-old female with brain death. Donor
assessment.

EXAM:
PORTABLE CHEST 1 VIEW

[chest ap]
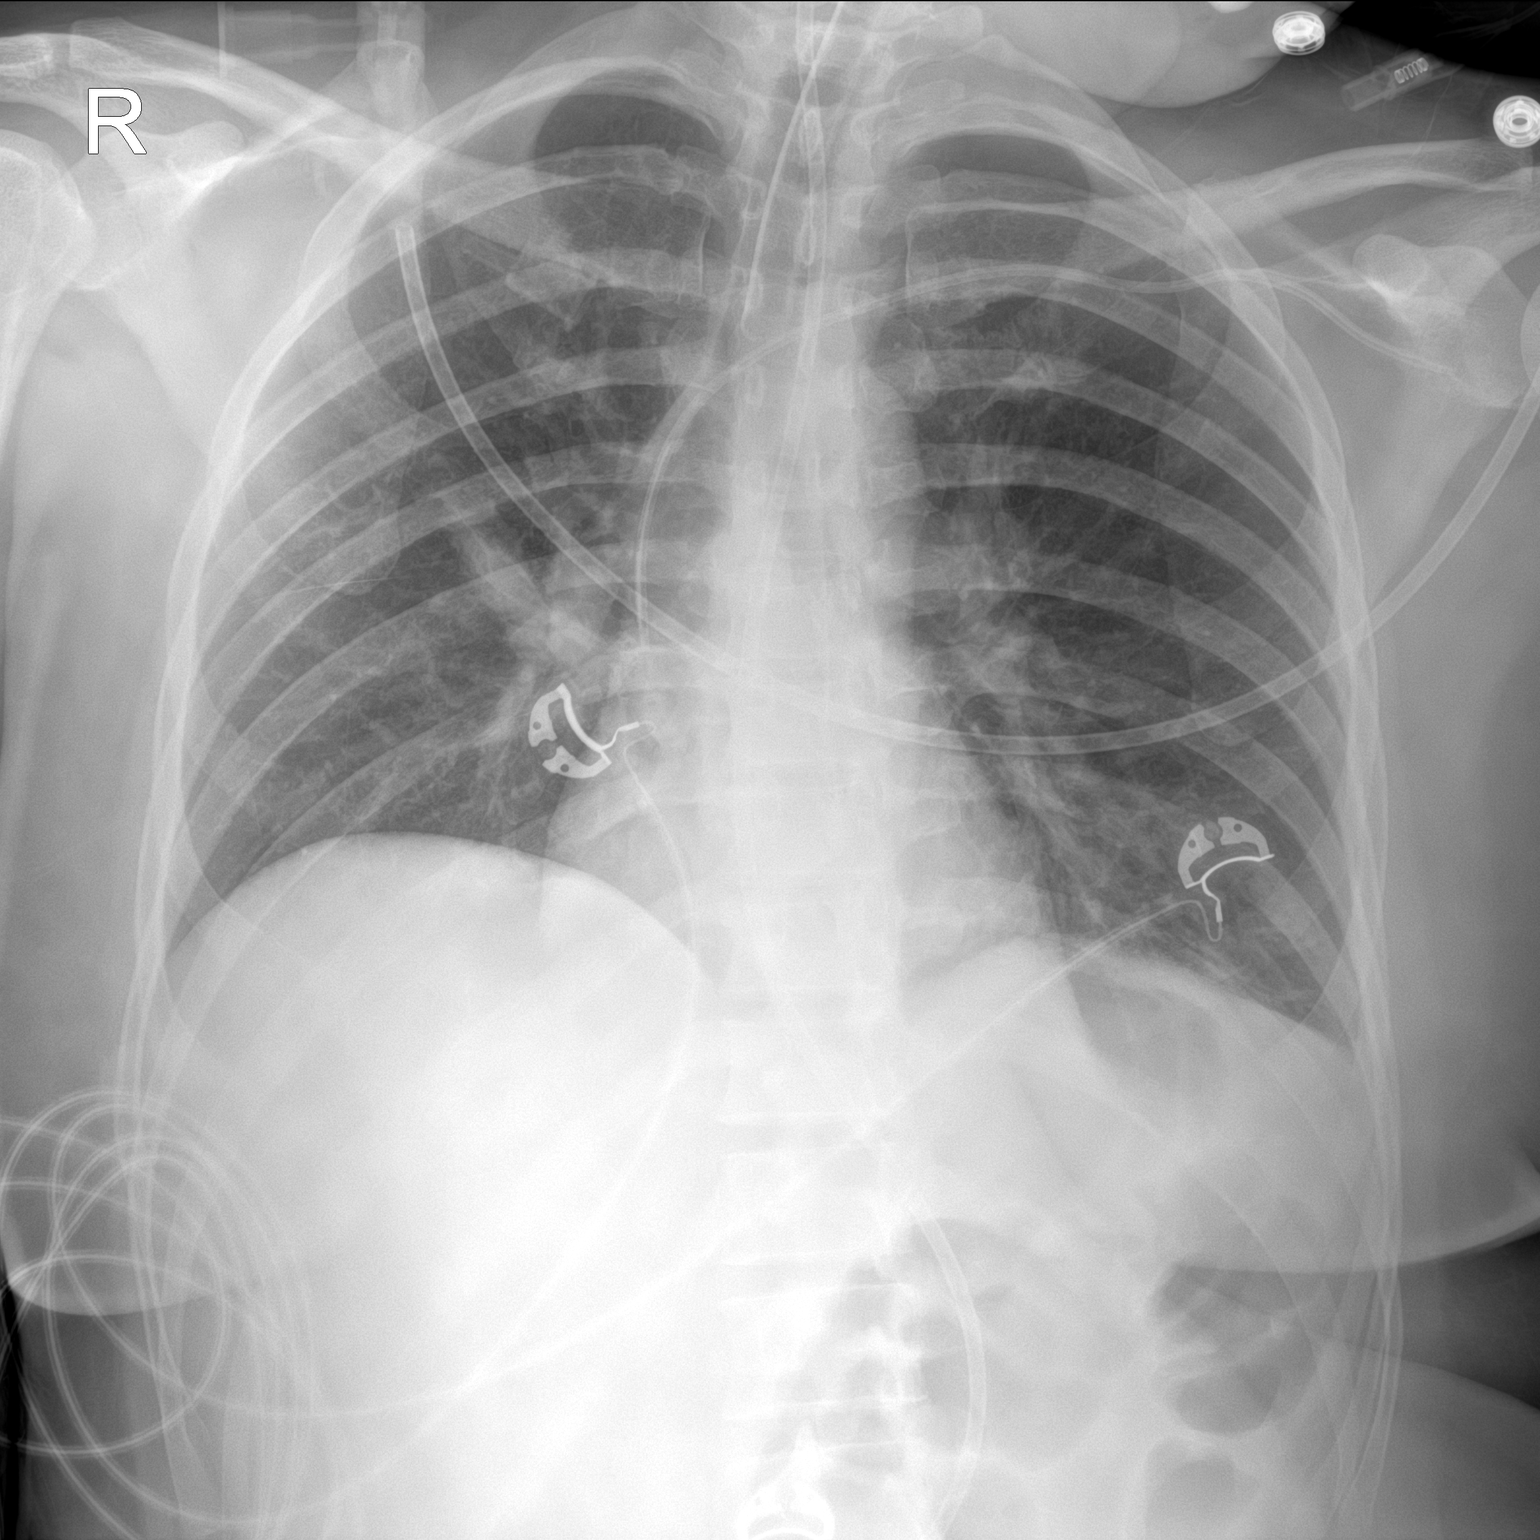

[1 of 1 positions shown; findings below may reference images not displayed]

FINDINGS: Portable AP view at [JK] hours. Stable lines and tubes. Mildly
improved lung volumes from yesterday. Mediastinal contours remain
normal. The bilateral lower lobe dependent and peribronchial
atelectasis or infection by CT is not apparent. No areas of
worsening ventilation. No pulmonary edema, pneumothorax or pleural
effusion.

No osseous abnormality identified. Negative visible bowel gas
pattern.
IMPRESSION: 1. Bilateral lower lobe opacity seen yesterday by CT is not apparent
and might be improved or occult by portable x-ray.
2. No new cardiopulmonary abnormality.
3.  Stable lines and tubes.

## 2020-08-15 IMAGING — DX DG CHEST 1V
1 series · 1 of 1 positions shown · non-contrast
Comparison: [DATE], [DATE] a.m.

CLINICAL DATA: Organ donor

EXAM:
CHEST  1 VIEW

[chest]
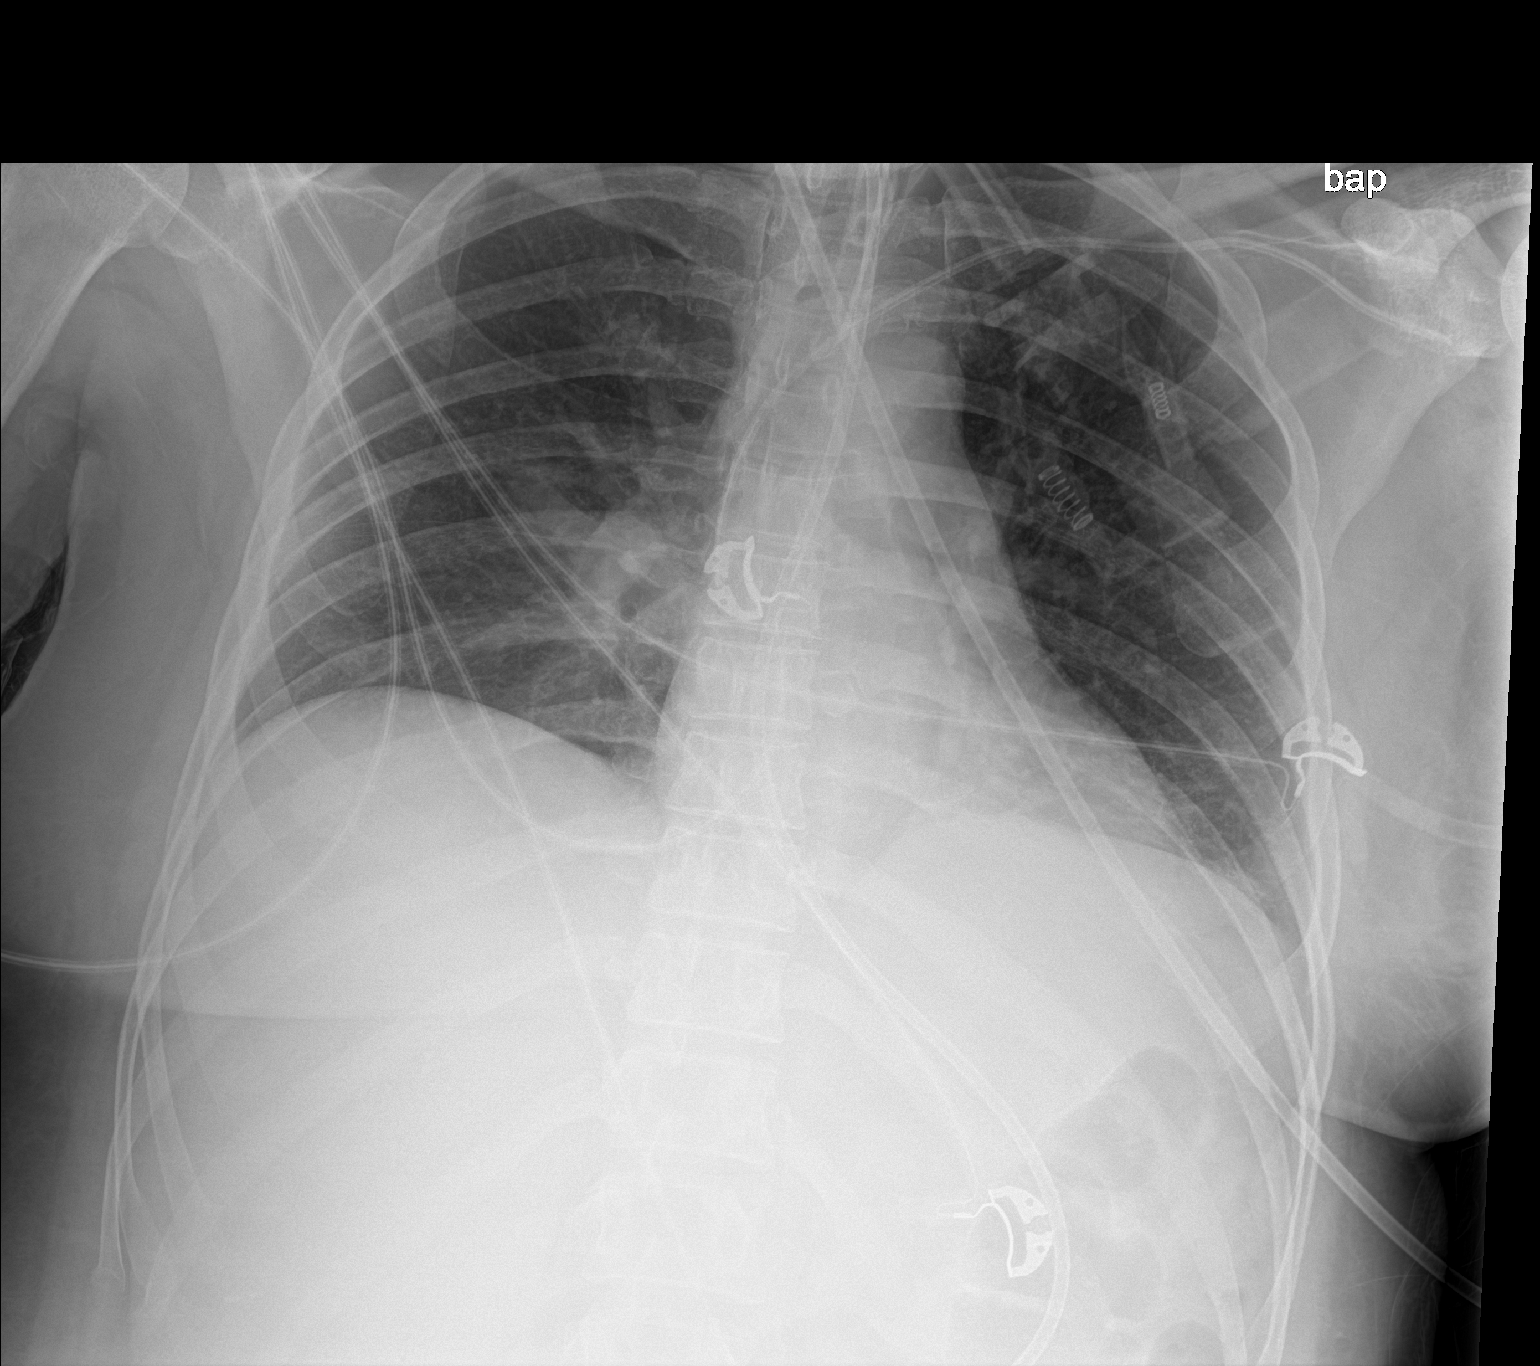

[1 of 1 positions shown; findings below may reference images not displayed]

FINDINGS: The heart size and mediastinal contours are within normal limits.
Unchanged support apparatus including endotracheal tube, enteric
tube, and left subclavian vascular catheter. Both lungs are clear.
The visualized skeletal structures are unremarkable.
IMPRESSION: No acute abnormality of the lungs in AP portable projection. Stable
support apparatus.

## 2020-08-15 MED ORDER — FUROSEMIDE 10 MG/ML IJ SOLN
20.0000 mg | Freq: Once | INTRAMUSCULAR | Status: AC
  Administered 2020-08-15: 20 mg via INTRAVENOUS
  Filled 2020-08-15: qty 2

## 2020-08-15 MED ORDER — FUROSEMIDE 10 MG/ML IJ SOLN
20.0000 mg | Freq: Once | INTRAMUSCULAR | Status: AC
  Administered 2020-08-16: 20 mg via INTRAVENOUS
  Filled 2020-08-15: qty 2

## 2020-08-15 MED ORDER — ALBUMIN HUMAN 25 % IV SOLN
12.5000 g | Freq: Once | INTRAVENOUS | Status: AC
  Administered 2020-08-15: 12.5 g via INTRAVENOUS
  Filled 2020-08-15: qty 50

## 2020-08-15 MED ORDER — POTASSIUM PHOSPHATES 15 MMOLE/5ML IV SOLN
20.0000 mmol | Freq: Once | INTRAVENOUS | Status: AC
  Administered 2020-08-15: 20 mmol via INTRAVENOUS
  Filled 2020-08-15: qty 6.67

## 2020-08-15 MED ORDER — DEXTROSE 5 % IV SOLN: INTRAVENOUS | Status: DC

## 2020-08-15 NOTE — Progress Notes (Signed)
Recruitment done and ABG collected

## 2020-08-15 NOTE — Progress Notes (Addendum)
The organ donor coordinator reports to a day shift nurse and night shift nurse that pt's heart can be candidate for organ donation. In order to do this, patient is required Heart Cath (tomorrow during a day) and Carotid US. Day shift nurse paged cardiology master but didn't get a message back.   RN I paged Elink first and notified that pt needs a Heart Cath tomorrow. Will continue to assess.   At 2025: Dr. Arsenio Loader (CCM) called and be aware.  At 2028: Also, Paged Dr. Scarlette Calico (cardiology fellow) and notified that patient needs Heart Cath and Carotid US tomorrow. She will communicate with a day team tomorrow.   Also, RT notified as well. Continue to assess.

## 2020-08-15 NOTE — Progress Notes (Signed)
RT NOTES: Unable to do recruitment d/t hypotension.

## 2020-08-15 NOTE — Progress Notes (Signed)
RT NOTES: Recruitment done, ABG obtained.

## 2020-08-15 NOTE — Progress Notes (Signed)
Subjective: Patient was pronounced brain dead yesterday by neurological criteria. CDS on board with plan for organ procurement.   Objective: Vital signs in last 24 hours: Temp:  [98.3 F (36.8 C)-99.7 F (37.6 C)] 99.7 F (37.6 C) (06/03 0400) Pulse Rate:  [70-123] 74 (06/03 0741) Resp:  [12-20] 20 (06/03 0741) BP: (80-158)/(48-89) 97/56 (06/03 0741) SpO2:  [99 %-100 %] 100 % (06/03 0741) Arterial Line BP: (89-130)/(41-69) 119/57 (06/03 0600) FiO2 (%):  [40 %-100 %] 100 % (06/03 0741)  Intake/Output from previous day: 06/02 0701 - 06/03 0700 In: 3852.7 [I.V.:1630.5; NG/GT:1715; IV Piggyback:507.2] Out: 4125 [Urine:4125] Intake/Output this shift: No intake/output data recorded.    Lab Results: Recent Labs    08/25/2020 1908 08/17/2020 2001 08/15/20 0416 08/15/20 0435  WBC 12.5*  --   --  10.0  HGB 6.4*   < > 8.2* 7.9*  HCT 26.1*   < > 24.0* 29.4*  PLT 297  --   --  271   < > = values in this interval not displayed.   BMET Recent Labs    08/26/2020 1908 08/29/2020 2001 08/15/20 0416 08/15/20 0435  NA 169*   < > 172* 169*  K 3.9   < > 2.8* 3.1*  CL 129*  --   --  >130*  CO2 36*  --   --  31  GLUCOSE 202*  --   --  223*  BUN 16  --   --  16  CREATININE 0.96  --   --  1.03*  CALCIUM 8.4*  --   --  8.7*   < > = values in this interval not displayed.    Studies/Results: CT HEAD WO CONTRAST  Addendum Date: 08/13/2020   ADDENDUM REPORT: 08/13/2020 17:52 ADDENDUM: These results were called by telephone at the time of interpretation on 08/13/2020 at 5:52 pm to provider XU, who verbally acknowledged these results. Electronically Signed   By: Marlan Palau M.D.   On: 08/13/2020 17:52   Result Date: 08/13/2020 CLINICAL DATA:  Mental status change.  Stroke. EXAM: CT HEAD WITHOUT CONTRAST TECHNIQUE: Contiguous axial images were obtained from the base of the skull through the vertex without intravenous contrast. COMPARISON:  CT head 08/13/2020 FINDINGS: Brain: Increased intracranial  pressure with further effacement of the basilar cisterns compared to the prior study. There is further compression of the lateral ventricles. There appears to be progressive edema in the left frontal and temporal lobe compared to the prior study. 9 mm midline shift to the left slightly increased. There is progressive edema in the midbrain and right thalamus. Large hemorrhagic right hemispheric infarct is similar to the prior study. There has been a right craniectomy with considerable brain swelling through the craniectomy defect. Small area of hypodense infarct in the left occipital lobe unchanged. Small hemorrhagic infarct in the left frontal parietal lobe is unchanged. Mild subarachnoid hemorrhage on the left over the convexity. Small amount of blood in the third ventricle is unchanged. Vascular: Negative for hyperdense vessel Skull: Large right-sided craniectomy unchanged. Sinuses/Orbits: Negative Other: None IMPRESSION: Increased intracranial pressure has progressed in the interval. Progressive transtentorial herniation. There is further effacement of the basilar cisterns. Increased edema in the left hemisphere and in the midbrain compared to earlier today. Large hemorrhagic right hemispheric infarct unchanged. 9 mm midline shift to the left slightly increased. Electronically Signed: By: Marlan Palau M.D. On: 08/13/2020 17:38   NM Brain Img Min 4 Views  Result Date: 08/17/2020 CLINICAL DATA:  Hemorrhagic  stroke, clinical brain death EXAM: NM BRAIN SCAN - 4+ VIEW TECHNIQUE: Radionuclide angiogram and static images of the brain were obtained after intravenous injection of radiopharmaceutical. RADIOPHARMACEUTICALS:  21.8 millicuries Tc-64m HMPAO IV COMPARISON:  CT head 08/13/2020 FINDINGS: Blood flow images demonstrate no intracranial perfusion. Flow is identified to the scalp except for an area in the RIGHT parietal region corresponding to site of craniectomy and marked cerebral swelling. Delayed static  images show no localization of tracer within brain parenchyma. Findings are consistent with brain death. IMPRESSION: No intracranial blood flow or brain uptake of tracer. Findings are consistent with brain death. Electronically Signed   By: Ulyses Southward M.D.   On: 08/13/2020 11:44   DG CHEST PORT 1 VIEW  Result Date: 08/15/2020 CLINICAL DATA:  40 year old female with brain death. Donor assessment. EXAM: PORTABLE CHEST 1 VIEW COMPARISON:  CT Chest, Abdomen, and Pelvis 08/25/2020 and earlier. FINDINGS: Portable AP view at 0343 hours. Stable lines and tubes. Mildly improved lung volumes from yesterday. Mediastinal contours remain normal. The bilateral lower lobe dependent and peribronchial atelectasis or infection by CT is not apparent. No areas of worsening ventilation. No pulmonary edema, pneumothorax or pleural effusion. No osseous abnormality identified. Negative visible bowel gas pattern. IMPRESSION: 1. Bilateral lower lobe opacity seen yesterday by CT is not apparent and might be improved or occult by portable x-ray. 2. No new cardiopulmonary abnormality. 3.  Stable lines and tubes. Electronically Signed   By: Odessa Fleming M.D.   On: 08/15/2020 07:20   DG CHEST PORT 1 VIEW  Result Date: 09/03/2020 CLINICAL DATA:  Central line placement, history of hemorrhagic stroke EXAM: PORTABLE CHEST 1 VIEW COMPARISON:  08/10/2020 FINDINGS: Single frontal view of the chest demonstrates stable endotracheal tube and enteric catheter. Left subclavian central venous catheter tip overlies the atriocaval junction. The cardiac silhouette is unremarkable. No airspace disease, effusion, or pneumothorax. No acute bony abnormalities. IMPRESSION: 1. No complication after central venous catheter placement. No acute intrathoracic process. Electronically Signed   By: Sharlet Salina M.D.   On: 09/02/2020 19:03   DG Abd Portable 1V  Result Date: 08/13/2020 CLINICAL DATA:  Check feeding catheter placement EXAM: PORTABLE ABDOMEN - 1 VIEW  COMPARISON:  08/11/2020 FINDINGS: Scattered large and small bowel gas is noted. Gastric catheter has been removed. New weighted feeding catheter is noted in the distal stomach directed towards the duodenal bulb. Scattered large and small bowel gas is noted. No free air is seen. IMPRESSION: Feeding catheter in the distal stomach/duodenal bulb. Electronically Signed   By: Alcide Clever M.D.   On: 08/13/2020 16:03   CT CHEST ABDOMEN PELVIS WO CONTRAST  Result Date: 08/20/2020 CLINICAL DATA:  Cardiac transplant donor assessment. Please include liver measurements. EXAM: CT CHEST, ABDOMEN AND PELVIS WITHOUT CONTRAST TECHNIQUE: Multidetector CT imaging of the chest, abdomen and pelvis was performed following the standard protocol without IV contrast. COMPARISON:  Chest radiograph earlier today. Abdominopelvic CT 07/15/2020 FINDINGS: CT CHEST FINDINGS Cardiovascular: Left subclavian central venous catheter tip in the lower SVC. The heart is normal in size. No pericardial effusion. Thoracic aorta is normal in caliber. The aorta at the aortic root measures 2.6 cm, mid ascending aorta 2.8 cm, distal ascending aorta just before the brachiocephalic take of 2.5 cm. Transverse aorta measures 2.1 cm, descending aorta at 2.2 cm. Conventional branching pattern from the aortic arch. No significant atherosclerosis. No visualized coronary artery calcifications. Mediastinum/Nodes: Endotracheal tube tip above the carina. Enteric tube decompressing the esophagus. There is no mediastinal  adenopathy. No visualized thyroid nodule. No axillary adenopathy. Lungs/Pleura: Dependent opacities within both lower lobes, slightly nodular in appearance. Trace pleural thickening without significant effusion. 3 mm right middle lobe pulmonary nodule. Trachea and central bronchi are patent. Musculoskeletal: There are no acute or suspicious osseous abnormalities. CT ABDOMEN PELVIS FINDINGS Hepatobiliary: Homogeneous density on this noncontrast exam. No  steatosis. Right lobe spans 19 cm in cranial caudal dimension. Left lobe spans 7 cm cranial caudal. Transverse dimension of 18.2 cm. AP dimension just below the main portal vein of 15.9 cm. Unremarkable gallbladder. No biliary dilatation. Pancreas: Slight fatty atrophy of the head and proximal body. No ductal dilatation or inflammation. No evidence of focal lesion. Spleen: Normal in size without focal abnormality. Adrenals/Urinary Tract: Normal adrenal glands. No hydronephrosis or renal calculi. No evidence of focal renal lesion. The right kidney measures 11.6 cm in sagittal dimension. The left kidney measures 12 cm in sagittal dimension. Decompressed urinary bladder by Foley catheter. Stomach/Bowel: Enteric tube within the stomach. Fluid/ingested material within the gastric body. Occasional fluid-filled loops of small bowel and colon without colonic wall thickening or inflammation. Normal appendix. No obstruction. Vascular/Lymphatic: Normal caliber abdominal aorta. No portal venous or mesenteric gas. No enlarged lymph nodes in the abdomen or pelvis. Left femoral catheter tip in the external iliac vein. Reproductive: Uterus and bilateral adnexa are unremarkable. Other: Trace free fluid in the pelvis. No free air. Skull flap in the right abdominal subcutaneous tissues. Musculoskeletal: There are no acute or suspicious osseous abnormalities. Mild sclerosis about the iliac aspects of both sacroiliac joints. IMPRESSION: 1. Normal heart size.  Normal caliber thoracic aorta. 2. Dependent opacities within both lower lobes, slightly nodular in appearance, may be atelectasis or aspiration. 3. Liver measurements as described above. Electronically Signed   By: Narda Rutherford M.D.   On: 08/26/2020 23:07    Assessment/Plan: Patient pronounced dead by neurological criteria at 11:44 AM on 09/05/2020. CDS has assumed care and planning for organ procurement. Neurosurgery signing off.    LOS: 8 days     Council Mechanic,  DNP, NP-C 08/15/2020, 8:16 AM

## 2020-08-15 NOTE — H&P (View-Only) (Signed)
eLink Physician-Brief Progress Note Patient Name: Regina Morris Texta DOB: 07/05/1980 MRN: [id number]   Date of Service  08/15/2020  HPI/Events of Note  Patient is possible heart donor. Organ donation coordinator request for cardiac cath as part of donation w/u.  eICU Interventions  Will consult cardiology on routine basis for cardiac cath. Cardiology on call paged. Await return call.      Intervention Category Major Interventions: Other:  Shallen Luedke Eugene 08/15/2020, 8:29 PM 

## 2020-08-15 NOTE — Progress Notes (Addendum)
eLink Physician-Brief Progress Note Patient Name: Regina Morris DOB: Nov 23, 1980 MRN: [id number]   Date of Service  08/15/2020  HPI/Events of Note  Patient is possible heart donor. Organ donation coordinator request for cardiac cath as part of donation w/u.  eICU Interventions  Will consult cardiology on routine basis for cardiac cath. Cardiology on call paged. Await return call.      Intervention Category Major Interventions: Other:  Lenell Antu 08/15/2020, 8:29 PM

## 2020-08-16 ENCOUNTER — Encounter (HOSPITAL_COMMUNITY): Admission: EM | Disposition: E | Payer: Self-pay | Source: Home / Self Care | Attending: Neurology

## 2020-08-16 ENCOUNTER — Other Ambulatory Visit (HOSPITAL_COMMUNITY): Payer: Self-pay

## 2020-08-16 ENCOUNTER — Encounter (HOSPITAL_COMMUNITY): Payer: Self-pay

## 2020-08-16 ENCOUNTER — Inpatient Hospital Stay (HOSPITAL_COMMUNITY): Payer: PRIVATE HEALTH INSURANCE

## 2020-08-16 ENCOUNTER — Inpatient Hospital Stay (HOSPITAL_COMMUNITY): Payer: Self-pay | Admitting: Anesthesiology

## 2020-08-16 ENCOUNTER — Encounter (HOSPITAL_COMMUNITY): Payer: Self-pay | Admitting: Anesthesiology

## 2020-08-16 DIAGNOSIS — Z529 Donor of unspecified organ or tissue: Secondary | ICD-10-CM

## 2020-08-16 HISTORY — PX: RIGHT/LEFT HEART CATH AND CORONARY ANGIOGRAPHY: CATH118266

## 2020-08-16 HISTORY — PX: ORGAN PROCUREMENT: SHX5270

## 2020-08-16 LAB — CBC
HCT: 27.9 % — ABNORMAL LOW (ref 36.0–46.0)
HCT: 26.6 % — ABNORMAL LOW (ref 36.0–46.0)
HCT: 29.6 % — ABNORMAL LOW (ref 36.0–46.0)
Hemoglobin: 8.1 g/dL — ABNORMAL LOW (ref 12.0–15.0)
Hemoglobin: 7.7 g/dL — ABNORMAL LOW (ref 12.0–15.0)
Hemoglobin: 8.9 g/dL — ABNORMAL LOW (ref 12.0–15.0)
MCH: 21.9 pg — ABNORMAL LOW (ref 26.0–34.0)
MCH: 21.9 pg — ABNORMAL LOW (ref 26.0–34.0)
MCH: 22.8 pg — ABNORMAL LOW (ref 26.0–34.0)
MCHC: 29 g/dL — ABNORMAL LOW (ref 30.0–36.0)
MCHC: 28.9 g/dL — ABNORMAL LOW (ref 30.0–36.0)
MCHC: 30.1 g/dL (ref 30.0–36.0)
MCV: 75.4 fL — ABNORMAL LOW (ref 80.0–100.0)
MCV: 75.6 fL — ABNORMAL LOW (ref 80.0–100.0)
MCV: 75.9 fL — ABNORMAL LOW (ref 80.0–100.0)
Platelets: 212 10*3/uL (ref 150–400)
Platelets: 204 10*3/uL (ref 150–400)
Platelets: 191 10*3/uL (ref 150–400)
RBC: 3.7 MIL/uL — ABNORMAL LOW (ref 3.87–5.11)
RBC: 3.52 MIL/uL — ABNORMAL LOW (ref 3.87–5.11)
RBC: 3.9 MIL/uL (ref 3.87–5.11)
RDW: 27.2 % — ABNORMAL HIGH (ref 11.5–15.5)
RDW: 27.6 % — ABNORMAL HIGH (ref 11.5–15.5)
RDW: 27.4 % — ABNORMAL HIGH (ref 11.5–15.5)
WBC: 12.6 10*3/uL — ABNORMAL HIGH (ref 4.0–10.5)
WBC: 13.1 10*3/uL — ABNORMAL HIGH (ref 4.0–10.5)
WBC: 14.6 10*3/uL — ABNORMAL HIGH (ref 4.0–10.5)
nRBC: 0.6 % — ABNORMAL HIGH (ref 0.0–0.2)
nRBC: 0.4 % — ABNORMAL HIGH (ref 0.0–0.2)
nRBC: 0.5 % — ABNORMAL HIGH (ref 0.0–0.2)

## 2020-08-16 LAB — POCT I-STAT 7, (LYTES, BLD GAS, ICA,H+H)
Acid-Base Excess: 9 mmol/L — ABNORMAL HIGH (ref 0.0–2.0)
Acid-Base Excess: 13 mmol/L — ABNORMAL HIGH (ref 0.0–2.0)
Acid-Base Excess: 11 mmol/L — ABNORMAL HIGH (ref 0.0–2.0)
Acid-Base Excess: 8 mmol/L — ABNORMAL HIGH (ref 0.0–2.0)
Acid-Base Excess: 11 mmol/L — ABNORMAL HIGH (ref 0.0–2.0)
Acid-Base Excess: 12 mmol/L — ABNORMAL HIGH (ref 0.0–2.0)
Acid-Base Excess: 10 mmol/L — ABNORMAL HIGH (ref 0.0–2.0)
Bicarbonate: 31.6 mmol/L — ABNORMAL HIGH (ref 20.0–28.0)
Bicarbonate: 35.4 mmol/L — ABNORMAL HIGH (ref 20.0–28.0)
Bicarbonate: 33.4 mmol/L — ABNORMAL HIGH (ref 20.0–28.0)
Bicarbonate: 32.5 mmol/L — ABNORMAL HIGH (ref 20.0–28.0)
Bicarbonate: 35.4 mmol/L — ABNORMAL HIGH (ref 20.0–28.0)
Bicarbonate: 35.4 mmol/L — ABNORMAL HIGH (ref 20.0–28.0)
Bicarbonate: 34.6 mmol/L — ABNORMAL HIGH (ref 20.0–28.0)
Calcium, Ion: 1.18 mmol/L (ref 1.15–1.40)
Calcium, Ion: 1.1 mmol/L — ABNORMAL LOW (ref 1.15–1.40)
Calcium, Ion: 1.09 mmol/L — ABNORMAL LOW (ref 1.15–1.40)
Calcium, Ion: 1.06 mmol/L — ABNORMAL LOW (ref 1.15–1.40)
Calcium, Ion: 1.08 mmol/L — ABNORMAL LOW (ref 1.15–1.40)
Calcium, Ion: 1.09 mmol/L — ABNORMAL LOW (ref 1.15–1.40)
Calcium, Ion: 1.04 mmol/L — ABNORMAL LOW (ref 1.15–1.40)
HCT: 27 % — ABNORMAL LOW (ref 36.0–46.0)
HCT: 23 % — ABNORMAL LOW (ref 36.0–46.0)
HCT: 23 % — ABNORMAL LOW (ref 36.0–46.0)
HCT: 24 % — ABNORMAL LOW (ref 36.0–46.0)
HCT: 25 % — ABNORMAL LOW (ref 36.0–46.0)
HCT: 23 % — ABNORMAL LOW (ref 36.0–46.0)
HCT: 27 % — ABNORMAL LOW (ref 36.0–46.0)
Hemoglobin: 9.2 g/dL — ABNORMAL LOW (ref 12.0–15.0)
Hemoglobin: 7.8 g/dL — ABNORMAL LOW (ref 12.0–15.0)
Hemoglobin: 7.8 g/dL — ABNORMAL LOW (ref 12.0–15.0)
Hemoglobin: 8.2 g/dL — ABNORMAL LOW (ref 12.0–15.0)
Hemoglobin: 8.5 g/dL — ABNORMAL LOW (ref 12.0–15.0)
Hemoglobin: 7.8 g/dL — ABNORMAL LOW (ref 12.0–15.0)
Hemoglobin: 9.2 g/dL — ABNORMAL LOW (ref 12.0–15.0)
O2 Saturation: 100 %
O2 Saturation: 100 %
O2 Saturation: 100 %
O2 Saturation: 100 %
O2 Saturation: 100 %
O2 Saturation: 100 %
O2 Saturation: 100 %
Patient temperature: 98.3
Patient temperature: 98.3
Patient temperature: 96.2
Patient temperature: 97.1
Patient temperature: 36
Potassium: 2.5 mmol/L — CL (ref 3.5–5.1)
Potassium: 2.7 mmol/L — CL (ref 3.5–5.1)
Potassium: 2.8 mmol/L — ABNORMAL LOW (ref 3.5–5.1)
Potassium: 2.5 mmol/L — CL (ref 3.5–5.1)
Potassium: 2.8 mmol/L — ABNORMAL LOW (ref 3.5–5.1)
Potassium: 2.9 mmol/L — ABNORMAL LOW (ref 3.5–5.1)
Potassium: 3.2 mmol/L — ABNORMAL LOW (ref 3.5–5.1)
Sodium: 167 mmol/L (ref 135–145)
Sodium: 169 mmol/L (ref 135–145)
Sodium: 166 mmol/L (ref 135–145)
Sodium: 166 mmol/L (ref 135–145)
Sodium: 169 mmol/L (ref 135–145)
Sodium: 166 mmol/L (ref 135–145)
Sodium: 168 mmol/L (ref 135–145)
TCO2: 33 mmol/L — ABNORMAL HIGH (ref 22–32)
TCO2: 37 mmol/L — ABNORMAL HIGH (ref 22–32)
TCO2: 34 mmol/L — ABNORMAL HIGH (ref 22–32)
TCO2: 34 mmol/L — ABNORMAL HIGH (ref 22–32)
TCO2: 37 mmol/L — ABNORMAL HIGH (ref 22–32)
TCO2: 37 mmol/L — ABNORMAL HIGH (ref 22–32)
TCO2: 36 mmol/L — ABNORMAL HIGH (ref 22–32)
pCO2 arterial: 35.1 mmHg (ref 32.0–48.0)
pCO2 arterial: 37.2 mmHg (ref 32.0–48.0)
pCO2 arterial: 32.7 mmHg (ref 32.0–48.0)
pCO2 arterial: 42.5 mmHg (ref 32.0–48.0)
pCO2 arterial: 41.6 mmHg (ref 32.0–48.0)
pCO2 arterial: 41.4 mmHg (ref 32.0–48.0)
pCO2 arterial: 44 mmHg (ref 32.0–48.0)
pH, Arterial: 7.562 — ABNORMAL HIGH (ref 7.350–7.450)
pH, Arterial: 7.586 — ABNORMAL HIGH (ref 7.350–7.450)
pH, Arterial: 7.613 (ref 7.350–7.450)
pH, Arterial: 7.491 — ABNORMAL HIGH (ref 7.350–7.450)
pH, Arterial: 7.535 — ABNORMAL HIGH (ref 7.350–7.450)
pH, Arterial: 7.541 — ABNORMAL HIGH (ref 7.350–7.450)
pH, Arterial: 7.499 — ABNORMAL HIGH (ref 7.350–7.450)
pO2, Arterial: 364 mmHg — ABNORMAL HIGH (ref 83.0–108.0)
pO2, Arterial: 441 mmHg — ABNORMAL HIGH (ref 83.0–108.0)
pO2, Arterial: 325 mmHg — ABNORMAL HIGH (ref 83.0–108.0)
pO2, Arterial: 492 mmHg — ABNORMAL HIGH (ref 83.0–108.0)
pO2, Arterial: 342 mmHg — ABNORMAL HIGH (ref 83.0–108.0)
pO2, Arterial: 296 mmHg — ABNORMAL HIGH (ref 83.0–108.0)
pO2, Arterial: 462 mmHg — ABNORMAL HIGH (ref 83.0–108.0)

## 2020-08-16 LAB — POCT I-STAT EG7
Acid-Base Excess: 5 mmol/L — ABNORMAL HIGH (ref 0.0–2.0)
Acid-Base Excess: 5 mmol/L — ABNORMAL HIGH (ref 0.0–2.0)
Acid-Base Excess: 5 mmol/L — ABNORMAL HIGH (ref 0.0–2.0)
Acid-Base Excess: 6 mmol/L — ABNORMAL HIGH (ref 0.0–2.0)
Acid-Base Excess: 6 mmol/L — ABNORMAL HIGH (ref 0.0–2.0)
Acid-Base Excess: 6 mmol/L — ABNORMAL HIGH (ref 0.0–2.0)
Bicarbonate: 29.1 mmol/L — ABNORMAL HIGH (ref 20.0–28.0)
Bicarbonate: 29.7 mmol/L — ABNORMAL HIGH (ref 20.0–28.0)
Bicarbonate: 30.1 mmol/L — ABNORMAL HIGH (ref 20.0–28.0)
Bicarbonate: 30.3 mmol/L — ABNORMAL HIGH (ref 20.0–28.0)
Bicarbonate: 30.4 mmol/L — ABNORMAL HIGH (ref 20.0–28.0)
Bicarbonate: 30.8 mmol/L — ABNORMAL HIGH (ref 20.0–28.0)
Calcium, Ion: 1.04 mmol/L — ABNORMAL LOW (ref 1.15–1.40)
Calcium, Ion: 1.07 mmol/L — ABNORMAL LOW (ref 1.15–1.40)
Calcium, Ion: 1.07 mmol/L — ABNORMAL LOW (ref 1.15–1.40)
Calcium, Ion: 1.08 mmol/L — ABNORMAL LOW (ref 1.15–1.40)
Calcium, Ion: 1.08 mmol/L — ABNORMAL LOW (ref 1.15–1.40)
Calcium, Ion: 1.09 mmol/L — ABNORMAL LOW (ref 1.15–1.40)
HCT: 24 % — ABNORMAL LOW (ref 36.0–46.0)
HCT: 24 % — ABNORMAL LOW (ref 36.0–46.0)
HCT: 24 % — ABNORMAL LOW (ref 36.0–46.0)
HCT: 25 % — ABNORMAL LOW (ref 36.0–46.0)
HCT: 25 % — ABNORMAL LOW (ref 36.0–46.0)
HCT: 25 % — ABNORMAL LOW (ref 36.0–46.0)
Hemoglobin: 8.2 g/dL — ABNORMAL LOW (ref 12.0–15.0)
Hemoglobin: 8.2 g/dL — ABNORMAL LOW (ref 12.0–15.0)
Hemoglobin: 8.2 g/dL — ABNORMAL LOW (ref 12.0–15.0)
Hemoglobin: 8.5 g/dL — ABNORMAL LOW (ref 12.0–15.0)
Hemoglobin: 8.5 g/dL — ABNORMAL LOW (ref 12.0–15.0)
Hemoglobin: 8.5 g/dL — ABNORMAL LOW (ref 12.0–15.0)
O2 Saturation: 79 %
O2 Saturation: 80 %
O2 Saturation: 82 %
O2 Saturation: 82 %
O2 Saturation: 83 %
O2 Saturation: 91 %
Potassium: 2.5 mmol/L — CL (ref 3.5–5.1)
Potassium: 2.5 mmol/L — CL (ref 3.5–5.1)
Potassium: 2.5 mmol/L — CL (ref 3.5–5.1)
Potassium: 2.5 mmol/L — CL (ref 3.5–5.1)
Potassium: 2.5 mmol/L — CL (ref 3.5–5.1)
Potassium: 2.6 mmol/L — CL (ref 3.5–5.1)
Sodium: 166 mmol/L (ref 135–145)
Sodium: 166 mmol/L (ref 135–145)
Sodium: 166 mmol/L (ref 135–145)
Sodium: 167 mmol/L (ref 135–145)
Sodium: 167 mmol/L (ref 135–145)
Sodium: 167 mmol/L (ref 135–145)
TCO2: 30 mmol/L (ref 22–32)
TCO2: 31 mmol/L (ref 22–32)
TCO2: 31 mmol/L (ref 22–32)
TCO2: 32 mmol/L (ref 22–32)
TCO2: 32 mmol/L (ref 22–32)
TCO2: 32 mmol/L (ref 22–32)
pCO2, Ven: 41.8 mmHg — ABNORMAL LOW (ref 44.0–60.0)
pCO2, Ven: 42.9 mmHg — ABNORMAL LOW (ref 44.0–60.0)
pCO2, Ven: 44.2 mmHg (ref 44.0–60.0)
pCO2, Ven: 44.3 mmHg (ref 44.0–60.0)
pCO2, Ven: 44.9 mmHg (ref 44.0–60.0)
pCO2, Ven: 44.9 mmHg (ref 44.0–60.0)
pH, Ven: 7.439 — ABNORMAL HIGH (ref 7.250–7.430)
pH, Ven: 7.441 — ABNORMAL HIGH (ref 7.250–7.430)
pH, Ven: 7.444 — ABNORMAL HIGH (ref 7.250–7.430)
pH, Ven: 7.445 — ABNORMAL HIGH (ref 7.250–7.430)
pH, Ven: 7.448 — ABNORMAL HIGH (ref 7.250–7.430)
pH, Ven: 7.45 — ABNORMAL HIGH (ref 7.250–7.430)
pO2, Ven: 42 mmHg (ref 32.0–45.0)
pO2, Ven: 43 mmHg (ref 32.0–45.0)
pO2, Ven: 44 mmHg (ref 32.0–45.0)
pO2, Ven: 45 mmHg (ref 32.0–45.0)
pO2, Ven: 46 mmHg — ABNORMAL HIGH (ref 32.0–45.0)
pO2, Ven: 58 mmHg — ABNORMAL HIGH (ref 32.0–45.0)

## 2020-08-16 LAB — COMPREHENSIVE METABOLIC PANEL
ALT: 16 U/L (ref 0–44)
ALT: 18 U/L (ref 0–44)
ALT: 20 U/L (ref 0–44)
AST: 21 U/L (ref 15–41)
AST: 26 U/L (ref 15–41)
AST: 33 U/L (ref 15–41)
Albumin: 1.8 g/dL — ABNORMAL LOW (ref 3.5–5.0)
Albumin: 1.7 g/dL — ABNORMAL LOW (ref 3.5–5.0)
Albumin: 2.2 g/dL — ABNORMAL LOW (ref 3.5–5.0)
Alkaline Phosphatase: 61 U/L (ref 38–126)
Alkaline Phosphatase: 62 U/L (ref 38–126)
Alkaline Phosphatase: 66 U/L (ref 38–126)
Anion gap: 9 (ref 5–15)
Anion gap: 9 (ref 5–15)
Anion gap: 11 (ref 5–15)
BUN: 23 mg/dL — ABNORMAL HIGH (ref 6–20)
BUN: 23 mg/dL — ABNORMAL HIGH (ref 6–20)
BUN: 24 mg/dL — ABNORMAL HIGH (ref 6–20)
CO2: 29 mmol/L (ref 22–32)
CO2: 27 mmol/L (ref 22–32)
CO2: 29 mmol/L (ref 22–32)
Calcium: 8.1 mg/dL — ABNORMAL LOW (ref 8.9–10.3)
Calcium: 7.8 mg/dL — ABNORMAL LOW (ref 8.9–10.3)
Calcium: 8 mg/dL — ABNORMAL LOW (ref 8.9–10.3)
Chloride: 129 mmol/L — ABNORMAL HIGH (ref 98–111)
Chloride: 126 mmol/L — ABNORMAL HIGH (ref 98–111)
Chloride: 125 mmol/L — ABNORMAL HIGH (ref 98–111)
Creatinine, Ser: 1.02 mg/dL — ABNORMAL HIGH (ref 0.44–1.00)
Creatinine, Ser: 1.04 mg/dL — ABNORMAL HIGH (ref 0.44–1.00)
Creatinine, Ser: 1.02 mg/dL — ABNORMAL HIGH (ref 0.44–1.00)
GFR, Estimated: >60 mL/min (ref >60)
GFR, Estimated: >60 mL/min (ref >60)
GFR, Estimated: >60 mL/min (ref >60)
Glucose, Bld: 221 mg/dL — ABNORMAL HIGH (ref 70–99)
Glucose, Bld: 230 mg/dL — ABNORMAL HIGH (ref 70–99)
Glucose, Bld: 200 mg/dL — ABNORMAL HIGH (ref 70–99)
Potassium: 2.5 mmol/L — CL (ref 3.5–5.1)
Potassium: 2.6 mmol/L — CL (ref 3.5–5.1)
Potassium: 2.7 mmol/L — CL (ref 3.5–5.1)
Sodium: 167 mmol/L (ref 135–145)
Sodium: 162 mmol/L (ref 135–145)
Sodium: 165 mmol/L (ref 135–145)
Total Bilirubin: 0.2 mg/dL — ABNORMAL LOW (ref 0.3–1.2)
Total Bilirubin: 0.5 mg/dL (ref 0.3–1.2)
Total Bilirubin: 0.6 mg/dL (ref 0.3–1.2)
Total Protein: 5.4 g/dL — ABNORMAL LOW (ref 6.5–8.1)
Total Protein: 5.1 g/dL — ABNORMAL LOW (ref 6.5–8.1)
Total Protein: 5.6 g/dL — ABNORMAL LOW (ref 6.5–8.1)

## 2020-08-16 LAB — URINALYSIS, ROUTINE W REFLEX MICROSCOPIC
Bilirubin Urine: NEGATIVE
Glucose, UA: NEGATIVE mg/dL
Hgb urine dipstick: NEGATIVE
Ketones, ur: NEGATIVE mg/dL
Nitrite: NEGATIVE
Protein, ur: NEGATIVE mg/dL
Specific Gravity, Urine: 1.005 (ref 1.005–1.030)
pH: 5 (ref 5.0–8.0)

## 2020-08-16 LAB — PREPARE RBC (CROSSMATCH)

## 2020-08-16 LAB — GLUCOSE, CAPILLARY
Glucose-Capillary: 145 mg/dL — ABNORMAL HIGH (ref 70–99)
Glucose-Capillary: 187 mg/dL — ABNORMAL HIGH (ref 70–99)
Glucose-Capillary: 192 mg/dL — ABNORMAL HIGH (ref 70–99)
Glucose-Capillary: 200 mg/dL — ABNORMAL HIGH (ref 70–99)
Glucose-Capillary: 203 mg/dL — ABNORMAL HIGH (ref 70–99)

## 2020-08-16 IMAGING — DX DG CHEST 1V PORT
1 series · 1 of 1 positions shown · non-contrast
Comparison: [DATE]

CLINICAL DATA: Organ donation

EXAM:
PORTABLE CHEST 1 VIEW

[chest ap]
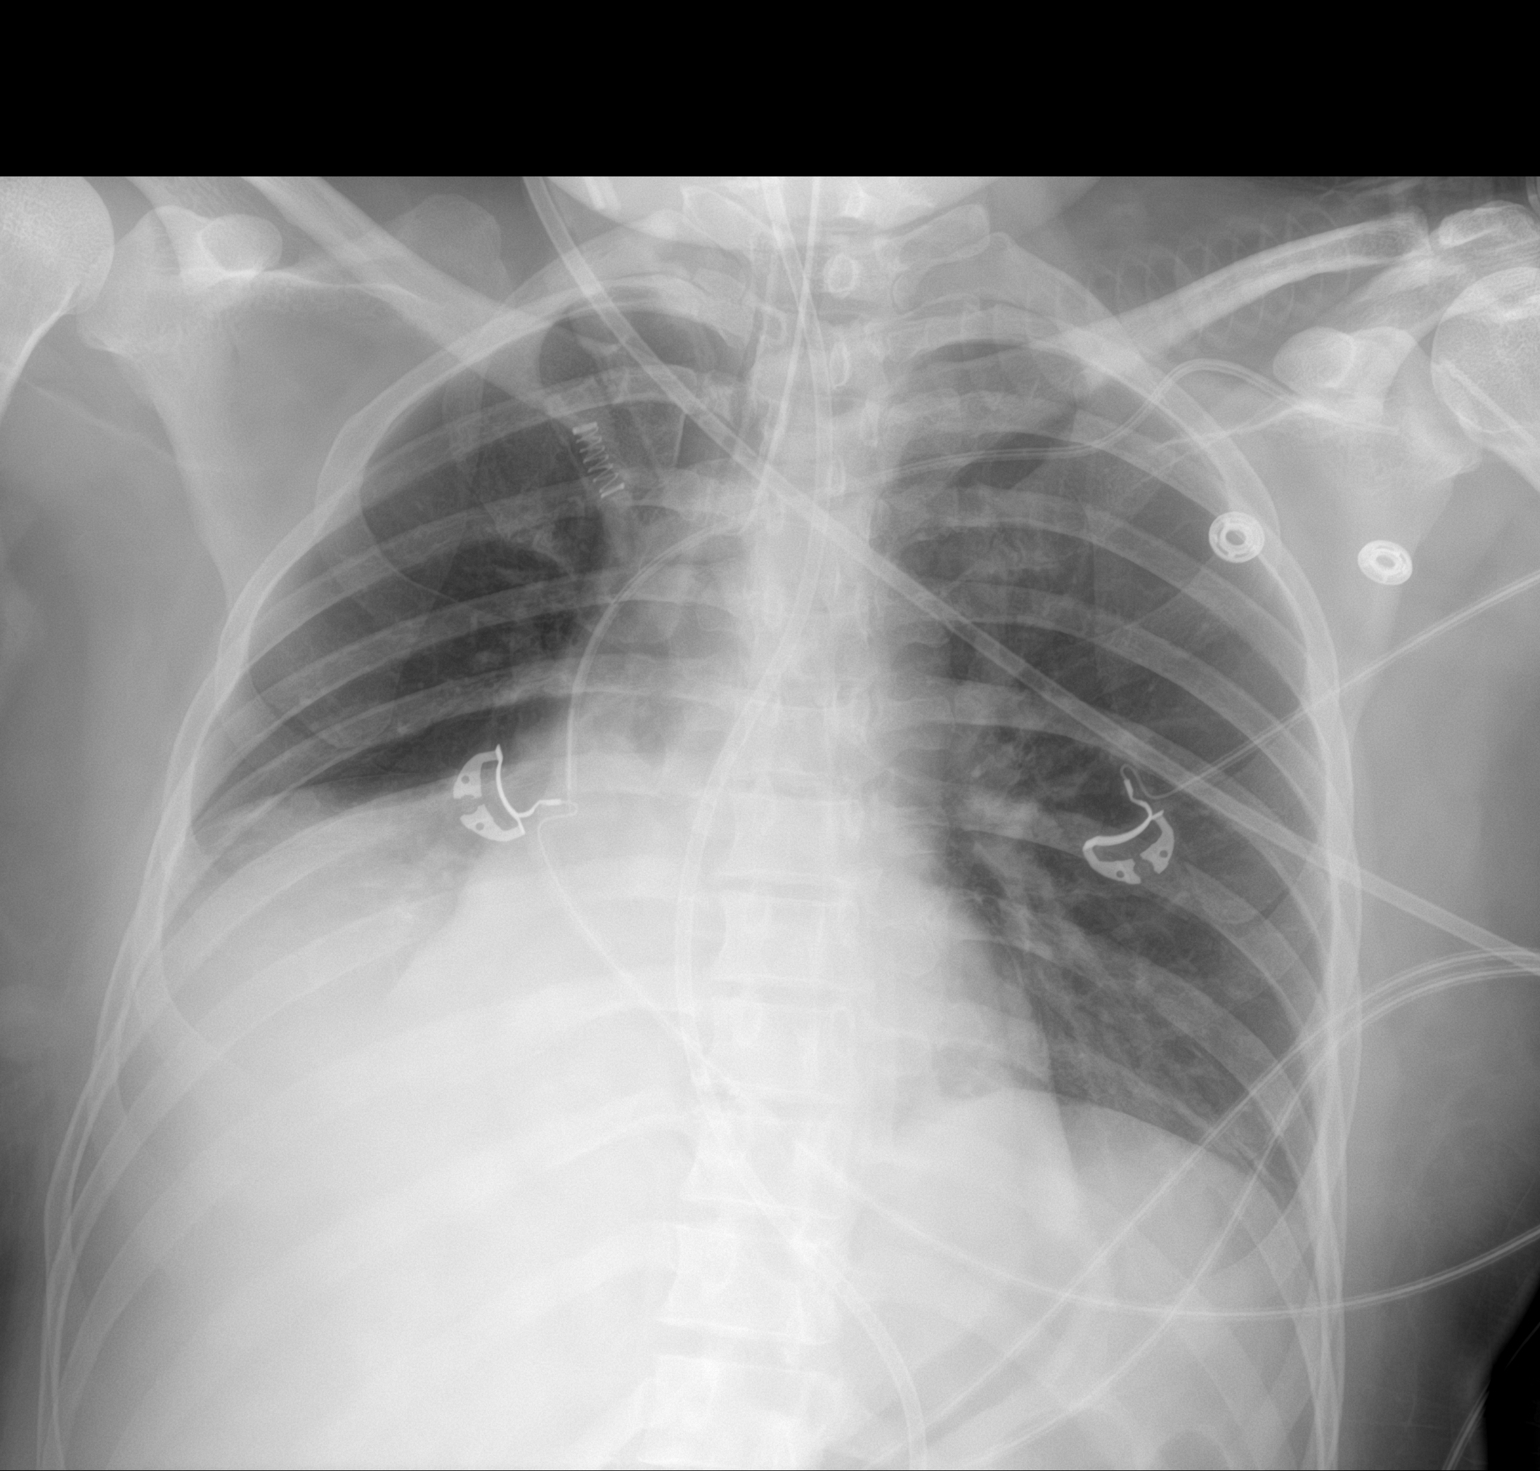

[1 of 1 positions shown; findings below may reference images not displayed]

FINDINGS: There is new right lower lobe atelectasis and or consolidation,
possibly with a small pleural effusion. The upper right lung and
left lung are normally aerated. Unchanged support apparatus
including endotracheal tube, esophagogastric tube, and left
subclavian vascular catheter. The heart and mediastinum are
unremarkable.
IMPRESSION: 1. There is new right lower lobe atelectasis and or consolidation,
possibly with a small pleural effusion. This may be related to
bronchial mucous plug.

2.  The upper right lung and left lung are normally aerated.

3.  Unchanged support apparatus.

## 2020-08-16 IMAGING — DX DG CHEST 1V PORT
1 series · 1 of 1 positions shown · non-contrast
Comparison: [DATE]

CLINICAL DATA: Hypoxia

EXAM:
PORTABLE CHEST 1 VIEW

[chest ap]
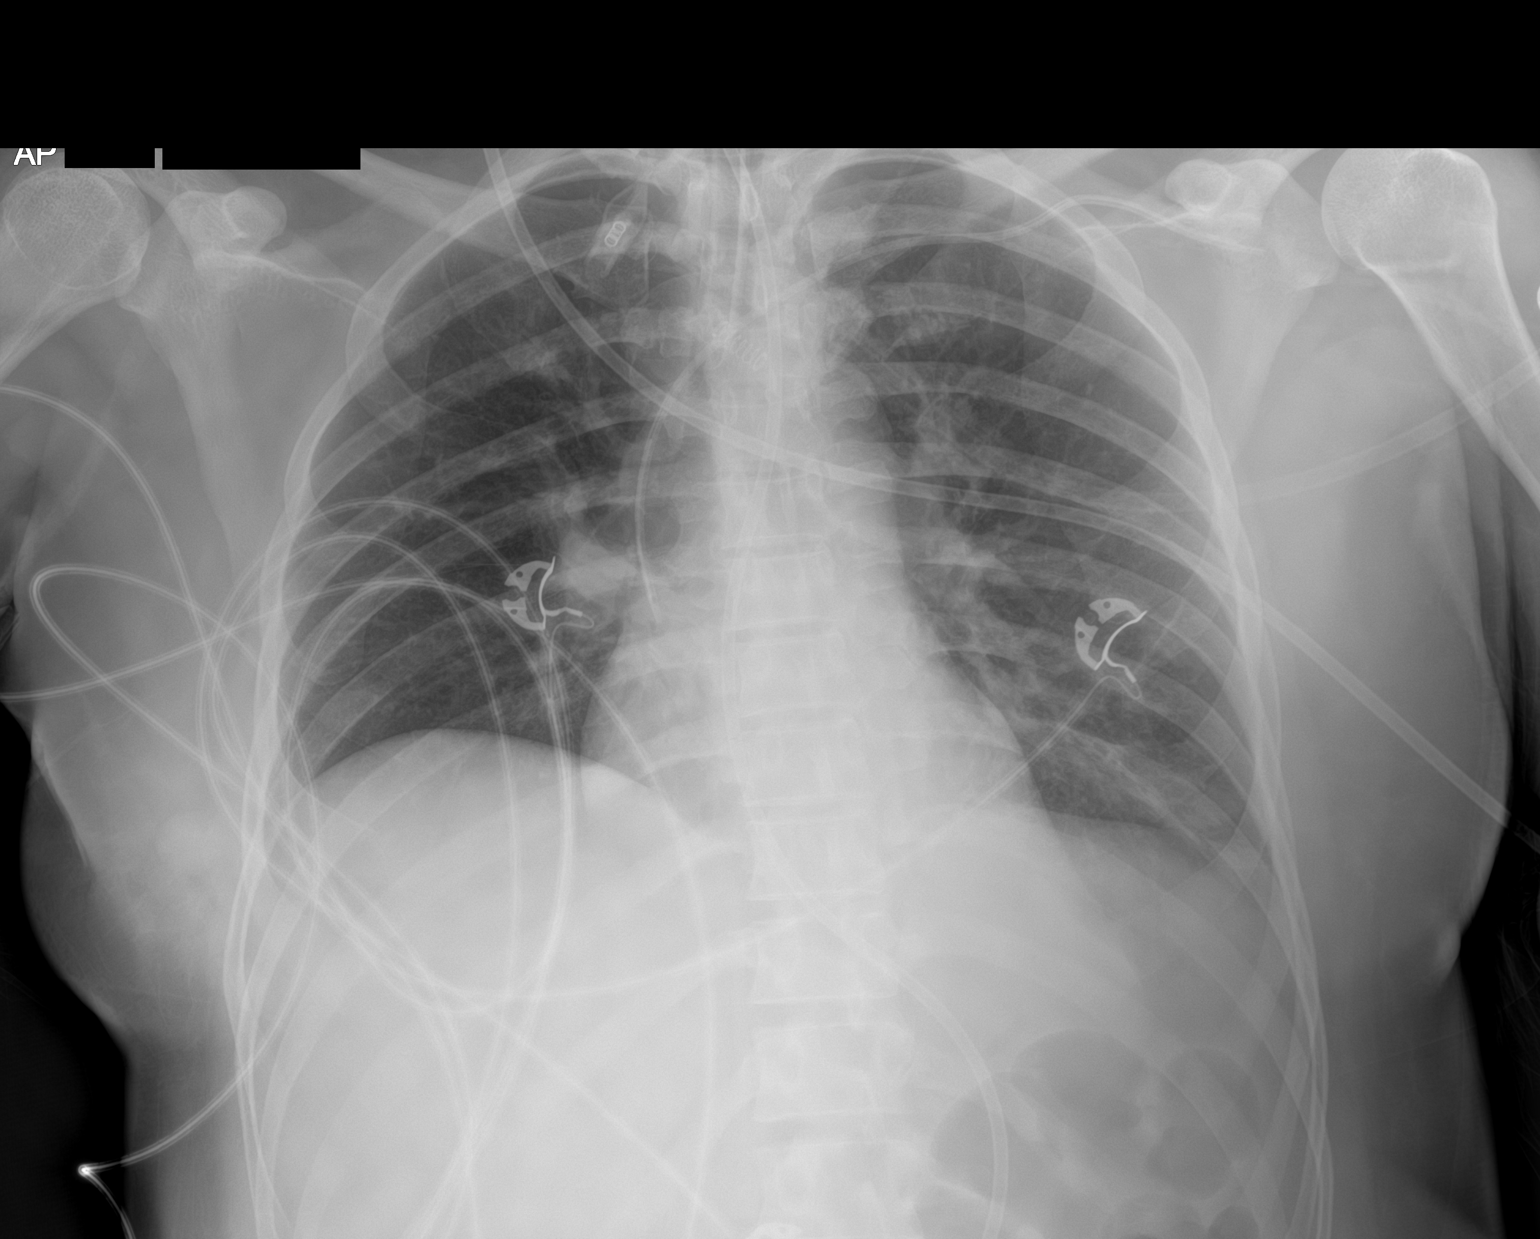

[1 of 1 positions shown; findings below may reference images not displayed]

FINDINGS: Endotracheal tube tip is 3.8 cm above the carina. Central catheter
tip is in the superior vena cava near the cavoatrial junction.
Feeding tube tip is below the diaphragm. No pneumothorax. Lungs are
clear. Heart size and pulmonary vascular normal. No adenopathy. No
bone lesions.
IMPRESSION: Tube and catheter positions as described without pneumothorax. Lungs
clear. Heart size normal.

## 2020-08-16 SURGERY — SURGICAL PROCUREMENT, ORGAN
Anesthesia: General | Site: Abdomen

## 2020-08-16 SURGERY — RIGHT/LEFT HEART CATH AND CORONARY ANGIOGRAPHY
Anesthesia: LOCAL

## 2020-08-16 MED ORDER — SODIUM CHLORIDE 0.9 % IV SOLN: INTRAVENOUS | Status: DC | PRN

## 2020-08-16 MED ORDER — METRONIDAZOLE IVPB CUSTOM
1000.0000 mg | Freq: Once | INTRAVENOUS | Status: AC
  Administered 2020-08-16: 1000 mg via INTRAVENOUS
  Filled 2020-08-16: qty 200

## 2020-08-16 MED ORDER — MAGNESIUM SULFATE 50 % IJ SOLN
5.0000 g | INTRAVENOUS | Status: AC
  Administered 2020-08-16: 5 g via INTRAVENOUS
  Filled 2020-08-16: qty 10

## 2020-08-16 MED ORDER — VASOPRESSIN 20 UNIT/ML IV SOLN: INTRAVENOUS | Status: DC | PRN

## 2020-08-16 MED ORDER — SODIUM CHLORIDE 0.9% IV SOLUTION: Freq: Once | INTRAVENOUS | Status: DC

## 2020-08-16 MED ORDER — PROPOFOL 10 MG/ML IV BOLUS
INTRAVENOUS | Status: AC
  Filled 2020-08-16: qty 20

## 2020-08-16 MED ORDER — VASOPRESSIN 20 UNIT/ML IV SOLN
INTRAVENOUS | Status: AC
  Filled 2020-08-16: qty 1

## 2020-08-16 MED ORDER — HEPARIN SODIUM (PORCINE) 1000 UNIT/ML IJ SOLN
INTRAMUSCULAR | Status: DC | PRN
  Administered 2020-08-16: 30000 [IU] via INTRAVENOUS

## 2020-08-16 MED ORDER — HEPARIN (PORCINE) IN NACL 1000-0.9 UT/500ML-% IV SOLN
INTRAVENOUS | Status: AC
  Filled 2020-08-16: qty 1000

## 2020-08-16 MED ORDER — SODIUM CHLORIDE 0.9% FLUSH
3.0000 mL | Freq: Two times a day (BID) | INTRAVENOUS | Status: DC
  Administered 2020-08-16: 3 mL via INTRAVENOUS

## 2020-08-16 MED ORDER — HYDRALAZINE HCL 20 MG/ML IJ SOLN: 10.0000 mg | INTRAMUSCULAR | Status: AC | PRN

## 2020-08-16 MED ORDER — ROCURONIUM BROMIDE 100 MG/10ML IV SOLN
INTRAVENOUS | Status: DC | PRN
  Administered 2020-08-16: 100 mg via INTRAVENOUS

## 2020-08-16 MED ORDER — ALBUMIN HUMAN 25 % IV SOLN
25.0000 g | Freq: Once | INTRAVENOUS | Status: AC
  Administered 2020-08-16: 25 g via INTRAVENOUS
  Filled 2020-08-16: qty 100

## 2020-08-16 MED ORDER — FREE WATER
200.0000 mL | Status: DC
  Administered 2020-08-16 (×8): 200 mL

## 2020-08-16 MED ORDER — FUROSEMIDE 10 MG/ML IJ SOLN
20.0000 mg | Freq: Once | INTRAMUSCULAR | Status: AC
  Administered 2020-08-16: 20 mg via INTRAVENOUS

## 2020-08-16 MED ORDER — PROPOFOL 10 MG/ML IV BOLUS
INTRAVENOUS | Status: DC | PRN
  Administered 2020-08-16: 140 mg via INTRAVENOUS

## 2020-08-16 MED ORDER — ROCURONIUM BROMIDE 10 MG/ML (PF) SYRINGE
PREFILLED_SYRINGE | INTRAVENOUS | Status: AC
  Filled 2020-08-16: qty 20

## 2020-08-16 MED ORDER — 0.9 % SODIUM CHLORIDE (POUR BTL) OPTIME
TOPICAL | Status: DC | PRN
  Administered 2020-08-16: 8000 mL

## 2020-08-16 MED ORDER — HEPARIN SODIUM (PORCINE) 1000 UNIT/ML IJ SOLN
INTRAMUSCULAR | Status: AC
  Filled 2020-08-16: qty 1

## 2020-08-16 MED ORDER — SODIUM CHLORIDE 0.9% FLUSH: 3.0000 mL | INTRAVENOUS | Status: DC | PRN

## 2020-08-16 MED ORDER — IOHEXOL 350 MG/ML SOLN
INTRAVENOUS | Status: DC | PRN
  Administered 2020-08-16: 30 mL

## 2020-08-16 MED ORDER — POTASSIUM CHLORIDE 10 MEQ/100ML IV SOLN
10.0000 meq | INTRAVENOUS | Status: AC
  Administered 2020-08-16 (×2): 10 meq via INTRAVENOUS
  Filled 2020-08-16 (×2): qty 100

## 2020-08-16 MED ORDER — VANCOMYCIN HCL 1000 MG/200ML IV SOLN
1000.0000 mg | Freq: Once | INTRAVENOUS | Status: AC
  Administered 2020-08-16: 1000 mg via INTRAVENOUS
  Filled 2020-08-16: qty 200

## 2020-08-16 MED ORDER — SODIUM CHLORIDE 0.9 % IV SOLN: 250.0000 mL | INTRAVENOUS | Status: DC | PRN

## 2020-08-16 MED ORDER — POTASSIUM PHOSPHATES 15 MMOLE/5ML IV SOLN: 20.0000 mmol | Freq: Once | INTRAVENOUS | Status: DC

## 2020-08-16 MED ORDER — POTASSIUM PHOSPHATES 15 MMOLE/5ML IV SOLN
20.0000 mmol | Freq: Once | INTRAVENOUS | Status: AC
  Administered 2020-08-16: 20 mmol via INTRAVENOUS
  Filled 2020-08-16: qty 6.67

## 2020-08-16 MED ORDER — LIDOCAINE HCL (PF) 1 % IJ SOLN
INTRAMUSCULAR | Status: AC
  Filled 2020-08-16: qty 30

## 2020-08-16 MED ORDER — CIPROFLOXACIN IN D5W 400 MG/200ML IV SOLN
400.0000 mg | Freq: Once | INTRAVENOUS | Status: AC
  Administered 2020-08-16: 400 mg via INTRAVENOUS
  Filled 2020-08-16: qty 200

## 2020-08-16 MED ORDER — LIDOCAINE HCL (PF) 1 % IJ SOLN
INTRAMUSCULAR | Status: DC | PRN
  Administered 2020-08-16: 5 mL

## 2020-08-16 SURGICAL SUPPLY — 86 items
BAG DECANTER FOR FLEXI CONT (MISCELLANEOUS) ×2 IMPLANT
BLADE CLIPPER SURG (BLADE) IMPLANT
BLADE SAW STERNAL (BLADE) ×2 IMPLANT
BLADE SURG 10 STRL SS (BLADE) IMPLANT
CLIP VESOCCLUDE MED 24/CT (CLIP) ×2 IMPLANT
CLIP VESOCCLUDE SM WIDE 24/CT (CLIP) ×2 IMPLANT
CNTNR URN SCR LID CUP LEK RST (MISCELLANEOUS) ×2 IMPLANT
CONT SPEC 4OZ STRL OR WHT (MISCELLANEOUS) ×4
COUNTER NEEDLE 20 DBL MAG RED (NEEDLE) ×2 IMPLANT
COVER BACK TABLE 60X90IN (DRAPES) ×2 IMPLANT
COVER MAYO STAND STRL (DRAPES) IMPLANT
COVER SURGICAL LIGHT HANDLE (MISCELLANEOUS) ×2 IMPLANT
DRAPE HALF SHEET 40X57 (DRAPES) IMPLANT
DRAPE SLUSH MACHINE 52X66 (DRAPES) ×4 IMPLANT
DRSG COVADERM 4X10 (GAUZE/BANDAGES/DRESSINGS) ×2 IMPLANT
DRSG COVADERM 4X14 (GAUZE/BANDAGES/DRESSINGS) ×4 IMPLANT
DRSG TELFA 3X8 NADH (GAUZE/BANDAGES/DRESSINGS) ×2 IMPLANT
DURAPREP 26ML APPLICATOR (WOUND CARE) ×2 IMPLANT
ELECT BLADE 6.5 EXT (BLADE) ×2 IMPLANT
ELECT REM PT RETURN 9FT ADLT (ELECTROSURGICAL) ×4
ELECTRODE REM PT RTRN 9FT ADLT (ELECTROSURGICAL) ×2 IMPLANT
GAUZE 4X4 16PLY RFD (DISPOSABLE) IMPLANT
GLOVE BIO SURGEON STRL SZ7 (GLOVE) IMPLANT
GLOVE BIO SURGEON STRL SZ7.5 (GLOVE) IMPLANT
GLOVE BIO SURGEON STRL SZ8 (GLOVE) IMPLANT
GLOVE BIO SURGEON STRL SZ8.5 (GLOVE) IMPLANT
GLOVE BIOGEL PI IND STRL 7.5 (GLOVE) IMPLANT
GLOVE BIOGEL PI IND STRL 8.5 (GLOVE) IMPLANT
GLOVE BIOGEL PI INDICATOR 7.5 (GLOVE)
GLOVE BIOGEL PI INDICATOR 8.5 (GLOVE)
GLOVE SRG 8 PF TXTR STRL LF DI (GLOVE) IMPLANT
GLOVE SURG POLYISO LF SZ7 (GLOVE) IMPLANT
GLOVE SURG POLYISO LF SZ7.5 (GLOVE) IMPLANT
GLOVE SURG SS PI 8.0 STRL IVOR (GLOVE) IMPLANT
GLOVE SURG UNDER POLY LF SZ7 (GLOVE) IMPLANT
GLOVE SURG UNDER POLY LF SZ8 (GLOVE)
GOWN STRL REUS W/ TWL LRG LVL3 (GOWN DISPOSABLE) ×4 IMPLANT
GOWN STRL REUS W/ TWL XL LVL3 (GOWN DISPOSABLE) ×2 IMPLANT
GOWN STRL REUS W/TWL LRG LVL3 (GOWN DISPOSABLE) ×8
GOWN STRL REUS W/TWL XL LVL3 (GOWN DISPOSABLE) ×4
HANDLE SUCTION POOLE (INSTRUMENTS) ×2 IMPLANT
INSERT FOGARTY 61MM (MISCELLANEOUS) ×2 IMPLANT
KIT POST MORTEM ADULT 36X90 (BAG) ×2 IMPLANT
KIT TURNOVER KIT B (KITS) ×2 IMPLANT
LOOP VESSEL MAXI BLUE (MISCELLANEOUS) IMPLANT
LOOP VESSEL MINI RED (MISCELLANEOUS) IMPLANT
MANIFOLD NEPTUNE II (INSTRUMENTS) ×2 IMPLANT
NEEDLE BIOPSY 14X6 SOFT TISS (NEEDLE) IMPLANT
NS IRRIG 1000ML POUR BTL (IV SOLUTION) ×16 IMPLANT
PACK AORTA (CUSTOM PROCEDURE TRAY) ×2 IMPLANT
PACK UNIVERSAL I (CUSTOM PROCEDURE TRAY) ×2 IMPLANT
PAD ARMBOARD 7.5X6 YLW CONV (MISCELLANEOUS) ×4 IMPLANT
PENCIL BUTTON HOLSTER BLD 10FT (ELECTRODE) ×4 IMPLANT
SPONGE INTESTINAL PEANUT (DISPOSABLE) IMPLANT
SPONGE LAP 18X18 RF (DISPOSABLE) IMPLANT
STAPLER VISISTAT 35W (STAPLE) ×4 IMPLANT
SUCTION POOLE HANDLE (INSTRUMENTS) ×4
SUT BONE WAX W31G (SUTURE) IMPLANT
SUT CHROMIC 3 0 SH 27 (SUTURE) ×2 IMPLANT
SUT ETHIBOND 5 LR DA (SUTURE) IMPLANT
SUT ETHILON 1 LR 30 (SUTURE) ×4 IMPLANT
SUT ETHILON 2 LR (SUTURE) ×4 IMPLANT
SUT PROLENE 3 0 RB 1 (SUTURE) IMPLANT
SUT PROLENE 3 0 SH 1 (SUTURE) IMPLANT
SUT PROLENE 4 0 RB 1 (SUTURE) ×4
SUT PROLENE 4-0 RB1 .5 CRCL 36 (SUTURE) ×2 IMPLANT
SUT PROLENE 5 0 C 1 24 (SUTURE) IMPLANT
SUT PROLENE 6 0 BV (SUTURE) IMPLANT
SUT SILK 0 TIES 10X30 (SUTURE) IMPLANT
SUT SILK 1 SH (SUTURE) IMPLANT
SUT SILK 1 TIES 10X30 (SUTURE) IMPLANT
SUT SILK 2 0 (SUTURE)
SUT SILK 2 0 SH (SUTURE) IMPLANT
SUT SILK 2 0 SH CR/8 (SUTURE) ×6 IMPLANT
SUT SILK 2 0 TIES 10X30 (SUTURE) IMPLANT
SUT SILK 2-0 18XBRD TIE 12 (SUTURE) IMPLANT
SUT SILK 3 0 SH CR/8 (SUTURE) IMPLANT
SUT SILK 3 0 TIES 10X30 (SUTURE) IMPLANT
SWAB COLLECTION DEVICE MRSA (MISCELLANEOUS) ×2 IMPLANT
SWAB CULTURE ESWAB REG 1ML (MISCELLANEOUS) ×2 IMPLANT
SYR 50ML LL SCALE MARK (SYRINGE) IMPLANT
SYRINGE TOOMEY DISP (SYRINGE) ×2 IMPLANT
TAPE UMBILICAL 1/8 X36 TWILL (MISCELLANEOUS) ×4 IMPLANT
TUBE CONNECTING 12X1/4 (SUCTIONS) ×6 IMPLANT
WATER STERILE IRR 1000ML POUR (IV SOLUTION) IMPLANT
YANKAUER SUCT BULB TIP NO VENT (SUCTIONS) ×4 IMPLANT

## 2020-08-16 SURGICAL SUPPLY — 14 items
CATH INFINITI 5FR MULTPACK ANG (CATHETERS) ×1 IMPLANT
CATH SWAN GANZ 7F STRAIGHT (CATHETERS) ×1 IMPLANT
DEVICE CLOSURE MYNXGRIP 5F (Vascular Products) ×1 IMPLANT
KIT HEART LEFT (KITS) ×2 IMPLANT
KIT MICROPUNCTURE NIT STIFF (SHEATH) ×1 IMPLANT
MAT PREVALON FULL STRYKER (MISCELLANEOUS) ×1 IMPLANT
PACK CARDIAC CATHETERIZATION (CUSTOM PROCEDURE TRAY) ×2 IMPLANT
SHEATH PINNACLE 5F 10CM (SHEATH) ×1 IMPLANT
SHEATH PINNACLE 7F 10CM (SHEATH) ×1 IMPLANT
SHEATH PROBE COVER 6X72 (BAG) ×1 IMPLANT
TRANSDUCER W/STOPCOCK (MISCELLANEOUS) ×2 IMPLANT
TUBING CIL FLEX 10 FLL-RA (TUBING) ×2 IMPLANT
WIRE EMERALD 3MM-J .025X260CM (WIRE) ×1 IMPLANT
WIRE EMERALD 3MM-J .035X150CM (WIRE) ×1 IMPLANT

## 2020-08-16 NOTE — OR Nursing (Signed)
Organ Removal Times: Cross Clamp Time: 2115 Heart Out: 2132 Liver out: 2138 Lungs out: 2145 Kidney Right: 2147 Kidney Left: 2150

## 2020-08-16 NOTE — Progress Notes (Signed)
Recruitment done/abg collected

## 2020-08-16 NOTE — Progress Notes (Signed)
Recruitment maneuver performed per CDS request. Pt with decrease in blood pressure while performing. ABG also obtained and results given to [facility] CDS coordinator. No new orders at this time. RT will continue to monitor and be available as needed.

## 2020-08-16 NOTE — Progress Notes (Signed)
Pt transported from 4N 27 to the cath lab and back without complication. Pt remains on the ventilator. RT and two RN's accompanied pt. VSS throughout. RT will continue to monitor and be available as needed.

## 2020-08-16 NOTE — Progress Notes (Signed)
ABG drawn and results called to [facility]. Verbal order received to decrease RR to 18. RN aware of changes. RT will continue to monitor and be available as needed.

## 2020-08-16 NOTE — Transfer of Care (Signed)
Immediate Anesthesia Transfer of Care Note  Patient: Regina Morris  Procedure(s) Performed: ORGAN PROCUREMENT LIVER, LUNGS, KIDNEYS, AND HEART (N/A Abdomen)  Patient Location: deceased  Anesthesia Type:deceased  Level of Consciousness: deceased  Airway & Oxygen Therapy: deceased  Post-op Assessment: deceased  Post vital signs: deceased  Last Vitals:  Vitals Value Taken Time  BP    Temp    Pulse    Resp    SpO2      Last Pain:  Vitals:   09/07/2020 1600  TempSrc: Axillary  PainSc:          Complications: No complications documented.

## 2020-08-16 NOTE — Progress Notes (Signed)
Recruitment maneuver performed per CDS request. ABG obtained and results given to [facility] CDS coordinator. RT will continue to monitor and be available as needed.

## 2020-08-16 NOTE — Anesthesia Preprocedure Evaluation (Addendum)
Anesthesia Evaluation  Patient identified by MRN, date of birth, ID band Patient unresponsive    Reviewed: Unable to perform ROS - Chart review only  Airway Mallampati: Intubated       Dental   Pulmonary    Pulmonary exam normal        Cardiovascular Normal cardiovascular exam     Neuro/Psych CVA    GI/Hepatic   Endo/Other  diabetes  Renal/GU      Musculoskeletal   Abdominal Normal abdominal exam  (+)   Peds  Hematology   Anesthesia Other Findings   Reproductive/Obstetrics                            Anesthesia Physical Anesthesia Plan  ASA: VI  Anesthesia Plan: General   Post-op Pain Management:    Induction: Inhalational  PONV Risk Score and Plan: 0  Airway Management Planned: Oral ETT  Additional Equipment: Arterial line  Intra-op Plan:   Post-operative Plan:   Informed Consent:     History available from chart only  Plan Discussed with: CRNA  Anesthesia Plan Comments:        Anesthesia Quick Evaluation

## 2020-08-16 NOTE — Anesthesia Postprocedure Evaluation (Signed)
Anesthesia Post Note  Patient: Regina Morris Texta  Procedure(s) Performed: ORGAN PROCUREMENT LIVER, LUNGS, KIDNEYS, AND HEART (N/A Abdomen)     Patient location during evaluation: Other Anesthesia Type: General Comments: Pt expired in OR.    No complications documented.              Shelton Silvas

## 2020-08-16 NOTE — Progress Notes (Signed)
Recruitment done/ABG collected

## 2020-08-16 NOTE — Progress Notes (Signed)
Current ABG results have not crossed over yet.   PH 7.54 PCO2 41.4 PaO2 296 HCO3 35.4 SaO2 100%  Regina Morris CDS coordinator verbally given these results. No new orders at this time.

## 2020-08-16 NOTE — Progress Notes (Signed)
Recruitment performed per CDS request for 30 seconds. Pt with drop in bp post maneuver with quick recovery. Pre BP 133/72 post BP 100/61 both based off of the arterial line. BP recovered to 137/74 via arterial line. ABG to be obtained in 30 minutes. RT will continue to monitor.

## 2020-08-16 NOTE — Interval H&P Note (Signed)
History and Physical Interval Note:  08/20/2020 10:17 AM  Regina Morris  has presented today for surgery, with the diagnosis of brain death; donor evaluation.  The various methods of treatment have been discussed with the patient and family. After consideration of risks, benefits and other options for treatment, the patient has consented to  Procedure(s): RIGHT/LEFT HEART CATH AND CORONARY ANGIOGRAPHY (N/A) as a surgical intervention.  The patient's history has been reviewed, patient examined, no change in status, stable for surgery.  I have reviewed the patient's chart and labs.  Questions were answered to the patient's satisfaction.     Demarquis Osley

## 2020-08-17 LAB — URINE CULTURE: Culture: >=100000 — AB

## 2020-08-17 LAB — CULTURE, RESPIRATORY W GRAM STAIN
Culture: NORMAL
Gram Stain: NONE SEEN
Special Requests: NORMAL

## 2020-08-18 ENCOUNTER — Encounter (HOSPITAL_COMMUNITY): Payer: Self-pay | Admitting: Internal Medicine

## 2020-08-18 LAB — TYPE AND SCREEN
ABO/RH(D): A POS
Antibody Screen: NEGATIVE
Unit division: 0
Unit division: 0
Unit division: 0
Unit division: 0
Unit division: 0
Unit division: 0
Unit division: 0

## 2020-08-18 LAB — BPAM RBC
Blood Product Expiration Date: 202206262359
Blood Product Expiration Date: 202206262359
Blood Product Expiration Date: 202206272359
Blood Product Expiration Date: 202206282359
Blood Product Expiration Date: 202206282359
Blood Product Expiration Date: 202206282359
Blood Product Expiration Date: 202206282359
ISSUE DATE / TIME: 202206022047
ISSUE DATE / TIME: 202206031541
ISSUE DATE / TIME: 202206040814
Unit Type and Rh: 6200
Unit Type and Rh: 6200
Unit Type and Rh: 6200
Unit Type and Rh: 6200
Unit Type and Rh: 6200
Unit Type and Rh: 6200
Unit Type and Rh: 6200

## 2020-08-19 LAB — CULTURE, BLOOD (ROUTINE X 2)
Culture: NO GROWTH
Culture: NO GROWTH
Special Requests: ADEQUATE
Special Requests: ADEQUATE

## 2020-08-19 LAB — SURGICAL PATHOLOGY

## 2020-08-25 NOTE — Discharge Summary (Signed)
DEATH SUMMARY   Patient Details  Name: Regina Morris MRN: [id number] DOB: [birth year]  Admission/Discharge Information   Admit Date:  08/27/2020  Date of Death: Date of Death: 2020-09-03  Time of Death: Time of Death: 1144  Length of Stay: 08/11/22  Referring Physician: Patient, No Pcp Per (Inactive)   Reason(s) for Hospitalization  Right MCA stroke.  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  Acute respiratory failure  Distributive neurogenic shock.   Brief Hospital Course (including significant findings, care, treatment, and services provided and events leading to death)  Regina Morris is a 40 y.o. year old female who  presented with headache nausea and vomiting to [facility] on 08-28-2022. Found to have right posterior MCA stroke with moderate SAH in the infarcted region.  . with a small subdural hematoma with mild mass effect. Tight M1 with no clot on CTA. Serial CT showed worsening edema over there next 48h and patient eventually underwent decompressive craniectomy with resection of non-viable brain. Clot in venous sinus noted of follow up CT's suggesting initial event was due to a venous sinus thrombosis, consistent with alerted mental status and headache at presentation.  Examination remained poor with no evidence of higher cortical function. Best response was flexion in RUE.  Palliative care consulted due to poor prognosis.  Unfortunately, patient had further progression of her exam and lost all brainstem reflexes. She became hypotensive requiring vasopressors and developed hypernatremia from central diabetes. Cerebral blood flow testing confirmed death by neurological criteria. Family accepted organ donation.   Pertinent Labs and Studies  Significant  Diagnostic Studies CT ABDOMEN PELVIS WO CONTRAST  Result Date: 27-Aug-2020 CLINICAL DATA:  Abdominal distension EXAM: CT ABDOMEN AND PELVIS WITHOUT CONTRAST TECHNIQUE: Multidetector CT imaging of the abdomen and pelvis was performed following the standard protocol without IV contrast. COMPARISON:  None. FINDINGS: Lower chest: The visualized lung bases are clear bilaterally. The visualized heart and pericardium are unremarkable. Hepatobiliary: No focal liver abnormality is seen. No gallstones, gallbladder wall thickening, or biliary dilatation. Pancreas: Unremarkable Spleen: Unremarkable a Adrenals/Urinary Tract: Adrenal glands are unremarkable. Kidneys are normal, without renal calculi, focal lesion, or hydronephrosis. Bladder is unremarkable. Stomach/Bowel: Stomach is within normal limits. Appendix appears normal. No evidence of bowel wall thickening, distention, or inflammatory changes. Trace free fluid within the pelvis is nonspecific, possibly physiologic in a female patient of this age. No free air. Vascular/Lymphatic: No significant vascular findings are present. No enlarged abdominal or pelvic lymph nodes. Reproductive: Uterus and bilateral adnexa are unremarkable. Other: No abdominal wall hernia.  Rectum unremarkable. Musculoskeletal: No acute bone abnormality. No lytic or blastic bone lesion identified. IMPRESSION: No acute intra-abdominal pathology identified. Electronically Signed   By: Helyn Numbers MD   On: 2020/08/27 02:45   CT ANGIO HEAD NECK W WO CM  Result Date: 08/10/2020 CLINICAL DATA:  Worsening brain edema EXAM: CT ANGIOGRAPHY HEAD AND NECK TECHNIQUE: Multidetector CT imaging of the head and neck was performed using the standard protocol during bolus administration of intravenous contrast. Multiplanar CT image reconstructions and MIPs were obtained to evaluate the vascular anatomy. Carotid stenosis measurements (when applicable) are obtained utilizing NASCET criteria, using the distal  internal carotid diameter as the denominator. CONTRAST:  75mL OMNIPAQUE IOHEXOL 350 MG/ML SOLN COMPARISON:  MRA from 3 days ago  FINDINGS: CTA NECK FINDINGS Aortic arch: Negative Right carotid system: Vessels are smooth and widely patent Left carotid system: Vessels are smooth and widely patent Vertebral arteries: No proximal subclavian stenosis. Codominant vertebral arteries are smooth and widely patent to the dura. Skeleton: Craniectomy on the right. Other neck: Negative Upper chest: Negative Review of the MIP images confirms the above findings CTA HEAD FINDINGS Anterior circulation: Smoothly contoured and widely patent carotid siphons. Attenuated right MCA branches similar to prior MRA with very stenotic at the M1 segment. No cortical artery enhancement at the inferior division where there was cytotoxic edema seen on initial imaging. The degree of narrowing is more than expected from pure edema and there has been no visible subarachnoid hemorrhage at this location to imply vasospasm. There is dural sinus thrombosis on contemporaneous CT, but a primary arterial insult is also of concern. The contralateral branch vessels are enhancing as expected. Negative for aneurysm. Posterior circulation: The vertebral and basilar arteries are smooth and widely patent. No branch occlusion, beading, or aneurysm. Venous sinuses: Reported separately Anatomic variants: None detected. Review of the MIP images confirms the above findings IMPRESSION: 1. Very attenuated right MCA branches similar to prior MRA with high-grade right M1 segment narrowing and no enhancing vessels at the level of initially visualized infarct. Although there is dural sinus thrombosis, there may also be underlying arterial pathology. 2. No stenosis or embolic source seen in the neck. Electronically Signed   By: Marnee Spring M.D.   On: 08/10/2020 06:03   DG Chest 1 View  Result Date: 08/15/2020 CLINICAL DATA:  Organ donor EXAM: CHEST  1 VIEW COMPARISON:   08/15/2020, 3:43 a.m. FINDINGS: The heart size and mediastinal contours are within normal limits. Unchanged support apparatus including endotracheal tube, enteric tube, and left subclavian vascular catheter. Both lungs are clear. The visualized skeletal structures are unremarkable. IMPRESSION: No acute abnormality of the lungs in AP portable projection. Stable support apparatus. Electronically Signed   By: Lauralyn Primes M.D.   On: 08/15/2020 21:12   DG Chest 2 View  Result Date: 08/12/2020 CLINICAL DATA:  Cough EXAM: CHEST - 2 VIEW COMPARISON:  12/12/2019 FINDINGS: Lungs volumes are small, but are symmetric and are clear save for mild left basilar atelectasis. No pneumothorax or pleural effusion. Cardiac size within normal limits. Pulmonary vascularity is normal. Osseous structures are age-appropriate. No acute bone abnormality. IMPRESSION: No active cardiopulmonary disease. Electronically Signed   By: Helyn Numbers MD   On: 07/26/2020 00:39   DG Abd 1 View  Result Date: 08/10/2020 CLINICAL DATA:  NG tube placement. EXAM: ABDOMEN - 1 VIEW COMPARISON:  Aug 08, 2020 FINDINGS: Limited view of the abdomen demonstrates the tip of feeding catheter at the expected location of the gastric antrum. Nonobstructive/nonspecific bowel gas pattern. Contrast opacification of the urinary bladder. No radio-opaque calculi or other significant radiographic abnormality are seen. IMPRESSION: Tip of feeding catheter at the expected location of the gastric antrum. Electronically Signed   By: Ted Mcalpine M.D.   On: 08/10/2020 11:40   CT HEAD WO CONTRAST  Addendum Date: 08/13/2020   ADDENDUM REPORT: 08/13/2020 17:52 ADDENDUM: These results were called by telephone at the time of interpretation on 08/13/2020 at 5:52 pm to provider XU, who verbally acknowledged these results. Electronically Signed   By: Marlan Palau M.D.   On: 08/13/2020 17:52   Result Date: 08/13/2020 CLINICAL DATA:  Mental status change.  Stroke. EXAM: CT  HEAD WITHOUT CONTRAST TECHNIQUE: Contiguous axial images were obtained  from the base of the skull through the vertex without intravenous contrast. COMPARISON:  CT head 08/13/2020 FINDINGS: Brain: Increased intracranial pressure with further effacement of the basilar cisterns compared to the prior study. There is further compression of the lateral ventricles. There appears to be progressive edema in the left frontal and temporal lobe compared to the prior study. 9 mm midline shift to the left slightly increased. There is progressive edema in the midbrain and right thalamus. Large hemorrhagic right hemispheric infarct is similar to the prior study. There has been a right craniectomy with considerable brain swelling through the craniectomy defect. Small area of hypodense infarct in the left occipital lobe unchanged. Small hemorrhagic infarct in the left frontal parietal lobe is unchanged. Mild subarachnoid hemorrhage on the left over the convexity. Small amount of blood in the third ventricle is unchanged. Vascular: Negative for hyperdense vessel Skull: Large right-sided craniectomy unchanged. Sinuses/Orbits: Negative Other: None IMPRESSION: Increased intracranial pressure has progressed in the interval. Progressive transtentorial herniation. There is further effacement of the basilar cisterns. Increased edema in the left hemisphere and in the midbrain compared to earlier today. Large hemorrhagic right hemispheric infarct unchanged. 9 mm midline shift to the left slightly increased. Electronically Signed: By: Marlan Palau M.D. On: 08/13/2020 17:38   CT HEAD WO CONTRAST  Result Date: 08/13/2020 CLINICAL DATA:  40 year old female with hemorrhagic right hemispheric infarcts, dural sinus thrombosis, craniectomy. EXAM: CT HEAD WITHOUT CONTRAST TECHNIQUE: Contiguous axial images were obtained from the base of the skull through the vertex without intravenous contrast. COMPARISON:  08/12/2020 head CT and earlier.  FINDINGS: Brain: Confluent right ACA and MCA infarcts with malignant hemorrhagic transformation and abundant cytotoxic edema. Stable extent of brain herniation through the craniectomy. Leftward midline shift at the anterior septum pellucidum remains stable at 6 to 7 mm (series 3, image 8). Improved patency of the suprasellar cistern since yesterday. Remaining basilar cisterns are stable. Incidental mega cisterna magna, normal variant. Stable mass effect on the right lateral ventricle. No trapped or enlarged ventricles. Small volume of IVH is stable since yesterday, including in the 3rd and 4th ventricles. Smaller hemorrhagic infarcts in the left frontal lobe are stable (series 3, image 14) with no significant mass effect. Stable gray-white matter differentiation elsewhere. Vascular: Stable. Skull: Stable.  Right craniectomy. Sinuses/Orbits: Stable right middle ear and mastoid opacification. Other Visualized paranasal sinuses and mastoids are stable and well aerated. Other: Stable postoperative changes to the scalp.  Negative orbits. IMPRESSION: 1. Stable head CT from yesterday, aside from slightly improved patency of the suprasellar cistern. Leftward midline shift is stable at 6 to 7 mm. - right side craniectomy following large hemorrhagic infarcts of the right ACA and MCA territories. Stable blood products and edema. Smaller hemorrhagic left frontal lobe infarcts are stable without mass effect. - stable small volume IVH without ventriculomegaly. 2. No new intracranial abnormality. Electronically Signed   By: Odessa Fleming M.D.   On: 08/13/2020 06:56   CT HEAD WO CONTRAST  Result Date: 08/12/2020 CLINICAL DATA:  40 year old female status post hemorrhagic right MCA and ACA region infarcts. Dural sinus thrombosis. Craniectomy. EXAM: CT HEAD WITHOUT CONTRAST TECHNIQUE: Contiguous axial images were obtained from the base of the skull through the vertex without intravenous contrast. COMPARISON:  Brain MRI 08/11/2020.  Head  CT 08/10/2020 and earlier. FINDINGS: Brain: Extensive hemorrhagic infarction of the right hemisphere, relatively sparing the right deep gray nuclei. Progressed right hemisphere edema since the CT on 08/10/2020, not significantly different from the MRI  yesterday. Extension of brain parenchyma through the right side craniectomy is stable. Associated leftward midline shift of 6 mm at the anterior septum pellucidum appears mildly increased. Effaced suprasellar cistern is stable. Other basilar cisterns are preserved. Stable smaller left hemisphere infarcts with occasional hemorrhage from the MRI yesterday (series 3, image 22). Brainstem and cerebellum remain spared. Lateral ventricle mass effect has increased since 08/10/2020, stable since yesterday. Small volume of intraventricular hemorrhage is new since 08/10/2020. No ventriculomegaly. No other extra-axial blood identified. Vascular: Hyperdensity associated with superior sagittal sinus thrombosis again noted. Skull: Stable right hemi craniectomy. Sinuses/Orbits: Right tympanic cavity and mastoid opacification is stable. Other Visualized paranasal sinuses and mastoids are stable and well aerated. Other: Postoperative changes to the scalp. Stable associated broad-based scalp hematoma. Disconjugate gaze. IMPRESSION: 1. Right greater than left hemorrhagic infarcts are stable from the MRI yesterday, progressed since the 08/10/2020 CT. 2. A small volume of intraventricular hemorrhage is new. But mass effect on the ventricles is stable with no ventriculomegaly. 3. Intracranial mass effect stable from yesterday, with leftward midline shift at the anterior septum pellucidum up to 6 mm and effaced suprasellar cistern. 4. Superior sagittal sinus thrombosis.  Right hemi craniectomy. Electronically Signed   By: Odessa Fleming M.D.   On: 08/12/2020 05:56   CT HEAD WO CONTRAST  Result Date: 08/10/2020 CLINICAL DATA:  Stroke follow-up EXAM: CT HEAD WITHOUT CONTRAST TECHNIQUE: Contiguous  axial images were obtained from the base of the skull through the vertex without intravenous contrast. COMPARISON:  Yesterday FINDINGS: Brain: Interval craniectomy flap on the right with improved midline positioning. A small subcortical hemorrhage has developed in the subcortical left frontal parietal region a small volume of hemorrhage is seen behind the cerebellar vermis. Progressive edema with hemorrhage hemorrhage superiorly into the right frontal parietal region. Similar degree of hemorrhage at the previously infarcted area more inferiorly in the right hemisphere. Now symmetric lateral ventricular volume. There is a degree of lateral ventriculomegaly without visible obstructing process. Vascular: The superior sagittal sinus measures denser than the others dural sinuses and recommendation for interrogation recommended. Skull: Craniectomy flap. Sinuses/Orbits: Negative These results were called by telephone at the time of interpretation on 08/10/2020 at 4:28 am to Dr Derry Lory, Who verbally acknowledged these results. IMPRESSION: 1. Progressive edema with hemorrhage into the right frontal parietal region now reaching the ACA territory. Newly seen subcortical hemorrhage in the left frontal region. Findings are not clearly explained by resolved midline shift, recommend venous interrogation to exclude dural sinus thrombosis. 2. Craniectomy with essentially resolved midline shift. 3. Mild lateral ventriculomegaly. Electronically Signed   By: Marnee Spring M.D.   On: 08/10/2020 04:24   CT HEAD WO CONTRAST  Result Date: 07/16/2020 CLINICAL DATA:  Cerebral hemorrhage follow-up EXAM: CT HEAD WITHOUT CONTRAST TECHNIQUE: Contiguous axial images were obtained from the base of the skull through the vertex without intravenous contrast. COMPARISON:  Yesterday FINDINGS: Brain: Cytotoxic edema from infarct in the inferior division right MCA territory with unchanged superficial hemorrhage. Minimal subarachnoid hemorrhage at  the right parietal convexity. Midline shift measures 8 mm. No entrapment suspected. No evidence of new infarct. Vascular: Negative Skull: Negative Sinuses/Orbits: Negative IMPRESSION: Unchanged hemorrhagic right MCA territory infarct. Midline shift is unchanged at 8 mm. Electronically Signed   By: Marnee Spring M.D.   On: 07/21/2020 05:31   CT HEAD WO CONTRAST  Result Date: 08/08/2020 CLINICAL DATA:  Acute stroke, follow-up evaluation EXAM: CT HEAD WITHOUT CONTRAST TECHNIQUE: Contiguous axial images were obtained from the base  of the skull through the vertex without intravenous contrast. COMPARISON:  08/08/2020 at 7:37 p.m. FINDINGS: Brain: There is a grossly stable right MCA territory hemorrhagic infarct, with continued edema and internal hemorrhage unchanged since prior study. There is persistent mass effect, with leftward midline shift measuring 9 mm at the level of the septum pellucidum unchanged since prior study. Continued effacement of the right lateral ventricle. Stable mild dilation of the temporal and occipital horns of the left lateral ventricle. Basilar cisterns remain patent. Vascular: No hyperdense vessel or unexpected calcification. Skull: Normal. Negative for fracture or focal lesion. Sinuses/Orbits: Minimal fluid within the right mastoid air cells. Mild mucoperiosteal thickening in the ethmoid air cells unchanged. Other: None. IMPRESSION: 1. Grossly stable hemorrhagic right MCA territory infarct, with similar mass effect and leftward midline shift unchanged since prior exam. Electronically Signed   By: Sharlet Salina M.D.   On: 08/08/2020 23:56   CT HEAD WO CONTRAST  Result Date: 08/08/2020 CLINICAL DATA:  Follow-up examination for acute stroke. EXAM: CT HEAD WITHOUT CONTRAST TECHNIQUE: Contiguous axial images were obtained from the base of the skull through the vertex without intravenous contrast. COMPARISON:  Prior CT from earlier the same day performed at 2:42 a.m. FINDINGS: Brain:  Continued interval evolution of right MCA distribution infarct, overall relatively similar in size as compared to previous exam. Associated hemorrhagic transformation with internal hemorrhage, relatively similar. No evidence for interval bleeding. Associated mass effect has slightly worsened, with minimally increased right-to-left shift now measuring up to 9 mm, previously 8 mm. Additionally, there is slightly increased dilatation of the atrium and temporal horn of the left lateral ventricle as compared to previous trace periventricular hypodensity about the left atrium suggest a degree of transependymal flow of CSF, new and/or more conspicuous as compared to previous exam (series 3, image 16). Basilar cisterns are crowded but remain patent. No transtentorial herniation. Trace extra-axial extension of blood products overlying the right frontal convexity again noted, measuring no more than 2-3 mm in maximal thickness, relatively unchanged. No significant mass effect. No other new acute large vessel territory infarct or hemorrhage. Remainder the brain otherwise unremarkable. Vascular: No hyperdense vessel. Skull: Scalp soft tissues and calvarium demonstrate no new abnormality. Sinuses/Orbits: Globes and orbital soft tissues within normal limits. Nasogastric tube in place. Paranasal sinuses are largely clear. Small right mastoid effusion noted. Other: None. IMPRESSION: 1. Continued interval evolution of hemorrhagic right MCA distribution infarct, overall relatively similar in size as compared to previous exam. Associated mass effect has slightly worsened, with minimally increased right-to-left midline shift now measuring up to 9 mm, previously 8 mm. 2. Slightly increased dilatation of the atrium and temporal horn of the left lateral ventricle as compared to previous. Mild periventricular hypodensity about the left atrium suggests a degree of transependymal flow of CSF. 3. No other new acute intracranial abnormality.  Electronically Signed   By: Rise Mu M.D.   On: 08/08/2020 19:57   CT HEAD WO CONTRAST  Result Date: 08/08/2020 CLINICAL DATA:  Follow-up examination for stroke. EXAM: CT HEAD WITHOUT CONTRAST TECHNIQUE: Contiguous axial images were obtained from the base of the skull through the vertex without intravenous contrast. COMPARISON:  Prior CT from 08/08/2020 at 8:17 p.m. FINDINGS: Brain: Continued interval evolution of right MCA distribution infarct, similar in size with slightly more well-defined and hypodense as compared to previous exam. Internal hemorrhage within the area of infarction is relatively similar. Associated mass effect with up to 8 mm of right-to-left shift is not significantly changed.  Ventricular size and morphology is also relatively similar without hydrocephalus or trapping. Basilar cisterns remain patent. Probable extra-axial extension of hemorrhage with trace blood seen overlying the right frontal convexity (series 4, image 25). This was also present on prior exam in retrospect. This measures no more than 2 mm in maximal thickness without significant mass effect. No other new infarct or hemorrhage. Remainder the brain is otherwise stable and negative. Vascular: No hyperdense vessel. Skull: Scalp soft tissues demonstrate no acute finding. Calvarium intact. Sinuses/Orbits: Globes orbital soft tissues within normal limits. Scattered mucosal thickening noted within the ethmoidal air cells. Small right mastoid effusion noted. Other: None. IMPRESSION: 1. Continued interval evolution of hemorrhagic right MCA distribution infarct, similar in size but slightly more well-defined and hypodense as compared to previous exam. Associated mass effect with up to 8 mm of right-to-left shift is not significantly changed. 2. Probable extra-axial extension of hemorrhage with trace blood overlying the right frontal convexity, similar to previous. No significant mass effect. 3. No other new acute  intracranial abnormality. Electronically Signed   By: Rise Mu M.D.   On: 08/08/2020 03:50   CT HEAD WO CONTRAST  Result Date: 08/15/2020 CLINICAL DATA:  Stroke follow-up. EXAM: CT HEAD WITHOUT CONTRAST TECHNIQUE: Contiguous axial images were obtained from the base of the skull through the vertex without intravenous contrast. COMPARISON:  Head CT 09/09/2020 at 9:23 a.m. FINDINGS: Brain: A large acute right inferior division MCA infarct is again seen. Cytotoxic edema has mildly progressed with increased leftward midline shift, now measuring 8 mm (previously 4 mm). Confluent petechial type hemorrhage along the infarct and likely interspersed subarachnoid hemorrhage are unchanged. There has been no significant interval infarct extension. No new infarct is identified. The ventricles are unchanged in size. Vascular: No hyperdense vessel. Skull: No fracture or suspicious osseous lesion. Sinuses/Orbits: Small right mastoid effusion. Clear paranasal sinuses. Unremarkable orbits. Other: None. IMPRESSION: Evolving hemorrhagic right MCA infarct with increased leftward midline shift, now 8 mm. Electronically Signed   By: Sebastian Ache M.D.   On: 09/07/2020 20:30   CT HEAD WO CONTRAST  Result Date: Aug 16, 2020 CLINICAL DATA:  Follow-up cerebral hemorrhage EXAM: CT HEAD WITHOUT CONTRAST TECHNIQUE: Contiguous axial images were obtained from the base of the skull through the vertex without intravenous contrast. COMPARISON:  Head CT from earlier today FINDINGS: Brain: Unchanged extent of cytotoxic edema in the right posterolateral temporal lobe and parietal lobe, inferior division MCA territory. There is petechial type hemorrhage along the surface of the affected brain. Swelling causes 4 mm of mass effect towards the left of the midline. No entrapment or hydrocephalus. Vascular: No hyperdense vessel or unexpected calcification. Skull: Normal. Negative for fracture or focal lesion. Sinuses/Orbits: No acute finding.  IMPRESSION: Unchanged extent of hemorrhagic right MCA territory infarct with 4 mm of midline shift. Electronically Signed   By: Marnee Spring M.D.   On: 16-Aug-2020 09:36   CT Head Wo Contrast  Result Date: 08/29/2020 CLINICAL DATA:  Headache, nausea, vomiting EXAM: CT HEAD WITHOUT CONTRAST TECHNIQUE: Contiguous axial images were obtained from the base of the skull through the vertex without intravenous contrast. COMPARISON:  None. FINDINGS: Brain: There is a large area of cytotoxic edema involving the posterior temporal cortex most in keeping with a posterior MCA territory infarct. There is extensive subarachnoid hemorrhage within the area of cortical infarction. Mass effect is noted with effacement of the a overlying sulci. Tiny subdural hematoma noted lateral to the frontal lobe in the region of the sylvian fissure.  No midline shift. Ventricular size is normal. Cerebellum is unremarkable. Vascular: No asymmetric hyperdense vasculature at the skull base. Skull: Intact Sinuses/Orbits: The visualized orbits are unremarkable. The visualized paranasal sinuses are clear. Other: The mastoid air cells and middle ear cavities are clear. IMPRESSION: Large posterior right MCA territory infarct involving the posterior right temporal cortex with superimposed moderate subarachnoid hemorrhage interdigitating within the sulci of the infarcted region and tiny subdural hematoma. Mild mass effect. No midline shift. These results were called by telephone at the time of interpretation on 07/25/2020 at 2:37 am to provider [facility] , who verbally acknowledged these results. Electronically Signed   By: Helyn Numbers MD   On: 07/31/2020 02:41   MR ANGIO HEAD WO CONTRAST  Result Date: 07/17/2020 CLINICAL DATA:  Stroke workup EXAM: MRA HEAD WITHOUT CONTRAST TECHNIQUE: Angiographic images of the Circle of Willis were acquired using MRA technique without intravenous contrast. COMPARISON:  No pertinent prior exam. FINDINGS: Limited  by pervasive motion Anterior circulation: No flow limiting stenosis suspected at the cavernous carotids. Early right MCA bifurcation with blunted flow at the M2 branches, likely accentuated by artifact. No gross aneurysm Posterior circulation: Vertebral and basilar arteries appear diffusely patent. No evidence of branch occlusion, gross aneurysm, or flow limiting stenosis. Anatomic variants: No significant area. IMPRESSION: 1. Significantly degraded by motion. 2. Narrowed/irregularity right M2 branches correlating with the acute infarct. No detectable arterial disease elsewhere. Electronically Signed   By: Marnee Spring M.D.   On: 07/18/2020 07:04   MR BRAIN WO CONTRAST  Result Date: 08/11/2020 CLINICAL DATA:  Stroke follow-up. EXAM: MRI HEAD WITHOUT CONTRAST TECHNIQUE: Multiplanar, multiecho pulse sequences of the brain and surrounding structures were obtained without intravenous contrast. COMPARISON:  CT head Aug 10, 2020. FINDINGS: Brain: Extensive acute infarction throughout the right cerebral convexity, including right MCA and ACA territories.There is exuberant associated cytotoxic edema with areas of hemorrhagic transformation. The extent of hemorrhage and edema may be mildly progressed from the prior CT from Aug 10, 2020, although comparison is difficult across modalities. Prior right craniectomy with brain herniating through the defect. Slight paradoxical rightward midline shift as result (2 mm at the foramen of [place]). Additional smaller infarcts in the left frontal parietal white matter and left occipital cortex. Left parietal acute hemorrhage is likely similar. No hydrocephalus. Mildly prominent retro cerebellar CSF, likely chronic. Vascular: Major arterial flow voids at the skull base are maintained. Please see prior CTA for further arterial evaluation. Additionally, please see recent CT of the and v for evaluation of the dural venous sinuses. Skull and upper cervical spine: Normal marrow signal.  Sinuses/Orbits: Mild mucosal thickening. No acute orbital abnormality. Other: Large right mastoid effusion. IMPRESSION: 1. Extensive acute infarction throughout the right cerebral convexity, including right MCA and ACA territories. Exuberant associated edema with areas of hemorrhagic transformation. The extent of hemorrhage and edema may be mildly progressed from the recent CT from Aug 10, 2020, although comparison is difficult across modalities. Repeat CT could along more direct comparison if clinically indicated. 2. Prior right craniectomy with brain herniating through the defect. Slight paradoxical rightward midline shift as result (2 mm at the foramen of [place]). 3. Additional smaller infarcts in the left frontal parietal white matter and left occipital cortex. 4. Left parietal acute hemorrhage is likely similar. 5. Large right mastoid effusion. 6. Please see recent CTA and CTV for vascular findings. Electronically Signed   By: Feliberto Harts MD   On: 08/11/2020 14:39   MR BRAIN WO  CONTRAST  Result Date: 08/30/20 CLINICAL DATA:  Acute stroke suspected EXAM: MRI HEAD WITHOUT CONTRAST TECHNIQUE: Multiplanar, multiecho pulse sequences of the brain and surrounding structures were attempted without intravenous contrast. COMPARISON:  Head CT from earlier today FINDINGS: Only diffusion imaging could be acquired before the patient removed the head coil and terminated the exam. Restricted diffusion in the inferior division right MCA territory with petechial hemorrhage and swelling causing 4 mm of midline shift to the left. No detected change from prior CT. IMPRESSION: 1. Acute infarct involving the inferior division right MCA territory with petechial hemorrhage and 4 mm midline shift. 2. Diffusion only study due to patient condition. Electronically Signed   By: Marnee Spring M.D.   On: August 30, 2020 06:13   NM Brain Img Min 4 Views  Result Date: 08/26/2020 CLINICAL DATA:  Hemorrhagic stroke, clinical brain  death EXAM: NM BRAIN SCAN - 4+ VIEW TECHNIQUE: Radionuclide angiogram and static images of the brain were obtained after intravenous injection of radiopharmaceutical. RADIOPHARMACEUTICALS:  21.8 millicuries Tc-15m HMPAO IV COMPARISON:  CT head 08/13/2020 FINDINGS: Blood flow images demonstrate no intracranial perfusion. Flow is identified to the scalp except for an area in the RIGHT parietal region corresponding to site of craniectomy and marked cerebral swelling. Delayed static images show no localization of tracer within brain parenchyma. Findings are consistent with brain death. IMPRESSION: No intracranial blood flow or brain uptake of tracer. Findings are consistent with brain death. Electronically Signed   By: Ulyses Southward M.D.   On: 08/30/2020 11:44   CARDIAC CATHETERIZATION  Addendum Date: 09/10/2020   Conclusions: 1. Non-obstructive coronary artery disease involving the LAD with 20-30% mid vessel stenosis (suspect some degree of myocardial bridging) and 50% ostial stenosis of small D2 branch. 2. No angiographically significant coronary artery disease involving the LCx and RCA. 3. Upper normal left and right heart filling pressures. 4. Normal cardiac output by thermodilution. 5. Elevated Fick cardiac output/index, likely artifactual due to high FiO2 and anemia. Recommendations: 1. Per primary team/donor services. Yvonne Kendall, MD [facility]   Result Date: 08/28/2020 Conclusions: 1. Non-obstructive coronary artery disease involving the LAD with 20-30% mid vessel stenosis (suspect some degree of myocardial bridging) and 50% ostial stenosis of small D1 branch. 2. No angiographically significant coronary artery disease involving the LCx and RCA. 3. Upper normal left and right heart filling pressures. 4. Normal cardiac output by thermodilution. 5. Elevated Fick cardiac output/index, likely artifactual due to high FiO2 and anemia. Recommendations: 1. Per primary team/donor services. Yvonne Kendall, MD  [address]   DG CHEST PORT 1 VIEW  Result Date: 09/07/2020 CLINICAL DATA:  Organ donation EXAM: PORTABLE CHEST 1 VIEW COMPARISON:  08/20/2020 FINDINGS: There is new right lower lobe atelectasis and or consolidation, possibly with a small pleural effusion. The upper right lung and left lung are normally aerated. Unchanged support apparatus including endotracheal tube, esophagogastric tube, and left subclavian vascular catheter. The heart and mediastinum are unremarkable. IMPRESSION: 1. There is new right lower lobe atelectasis and or consolidation, possibly with a small pleural effusion. This may be related to bronchial mucous plug. 2.  The upper right lung and left lung are normally aerated. 3.  Unchanged support apparatus. Electronically Signed   By: Lauralyn Primes M.D.   On: 08/13/2020 17:10   DG Chest Port 1 View  Result Date: 09/04/2020 CLINICAL DATA:  Hypoxia EXAM: PORTABLE CHEST 1 VIEW COMPARISON:  August 15, 2020 FINDINGS: Endotracheal tube tip is 3.8 cm above the carina. Central catheter  tip is in the superior vena cava near the cavoatrial junction. Feeding tube tip is below the diaphragm. No pneumothorax. Lungs are clear. Heart size and pulmonary vascular normal. No adenopathy. No bone lesions. IMPRESSION: Tube and catheter positions as described without pneumothorax. Lungs clear. Heart size normal. Electronically Signed   By: Bretta Bang III M.D.   On: 08/13/2020 08:07   DG CHEST PORT 1 VIEW  Result Date: 08/15/2020 CLINICAL DATA:  40 year old female with brain death. Donor assessment. EXAM: PORTABLE CHEST 1 VIEW COMPARISON:  CT Chest, Abdomen, and Pelvis 2020-08-17 and earlier. FINDINGS: Portable AP view at 0343 hours. Stable lines and tubes. Mildly improved lung volumes from yesterday. Mediastinal contours remain normal. The bilateral lower lobe dependent and peribronchial atelectasis or infection by CT is not apparent. No areas of worsening ventilation. No pulmonary edema, pneumothorax or  pleural effusion. No osseous abnormality identified. Negative visible bowel gas pattern. IMPRESSION: 1. Bilateral lower lobe opacity seen yesterday by CT is not apparent and might be improved or occult by portable x-ray. 2. No new cardiopulmonary abnormality. 3.  Stable lines and tubes. Electronically Signed   By: Odessa Fleming M.D.   On: 08/15/2020 07:20   DG CHEST PORT 1 VIEW  Result Date: 08-17-20 CLINICAL DATA:  Central line placement, history of hemorrhagic stroke EXAM: PORTABLE CHEST 1 VIEW COMPARISON:  08/10/2020 FINDINGS: Single frontal view of the chest demonstrates stable endotracheal tube and enteric catheter. Left subclavian central venous catheter tip overlies the atriocaval junction. The cardiac silhouette is unremarkable. No airspace disease, effusion, or pneumothorax. No acute bony abnormalities. IMPRESSION: 1. No complication after central venous catheter placement. No acute intrathoracic process. Electronically Signed   By: Sharlet Salina M.D.   On: 2020/08/17 19:03   DG CHEST PORT 1 VIEW  Result Date: 08/10/2020 CLINICAL DATA:  Intracranial hemorrhage. EXAM: PORTABLE CHEST 1 VIEW COMPARISON:  June 07, 2020 FINDINGS: Endotracheal tube in satisfactory position. Enteric catheter with tip overlying the expected location of the gastric antrum. Cardiomediastinal silhouette is normal. Mediastinal contours appear intact. There is no evidence of focal airspace consolidation, pleural effusion or pneumothorax. Osseous structures are without acute abnormality. Soft tissues are grossly normal. IMPRESSION: No active disease. Electronically Signed   By: Ted Mcalpine M.D.   On: 08/10/2020 11:41   DG Abd Portable 1V  Result Date: 08/13/2020 CLINICAL DATA:  Check feeding catheter placement EXAM: PORTABLE ABDOMEN - 1 VIEW COMPARISON:  08/11/2020 FINDINGS: Scattered large and small bowel gas is noted. Gastric catheter has been removed. New weighted feeding catheter is noted in the distal stomach  directed towards the duodenal bulb. Scattered large and small bowel gas is noted. No free air is seen. IMPRESSION: Feeding catheter in the distal stomach/duodenal bulb. Electronically Signed   By: Alcide Clever M.D.   On: 08/13/2020 16:03   DG Abd Portable 1V  Result Date: 08/11/2020 CLINICAL DATA:  40 year old female status post orogastric tube placement. EXAM: PORTABLE ABDOMEN - 1 VIEW COMPARISON:  Abdominal radiograph 08/10/2020. FINDINGS: Enteric tube noted with tip and side port in the body of the stomach. The bowel gas pattern is normal. No radio-opaque calculi or other significant radiographic abnormality are seen. IMPRESSION: 1. Enteric tube positioned with tip and side port in the body of the stomach. 2. Nonobstructive bowel gas pattern. Electronically Signed   By: Trudie Reed M.D.   On: 08/11/2020 12:53   DG Abd Portable 1V  Result Date: 08/10/2020 CLINICAL DATA:  40 year old female status post enteric tube placement.  EXAM: PORTABLE ABDOMEN - 1 VIEW COMPARISON:  Earlier radiograph dated 08/10/2020. FINDINGS: The previously seen feeding tube has been removed and not visualized on this radiograph. No bowel dilatation or evidence of obstruction. Cutaneous surgical clips noted over the right lower quadrant. The osseous structures are intact. IMPRESSION: Interval removal of the feeding tube. No bowel dilatation. Electronically Signed   By: Elgie Collard M.D.   On: 08/10/2020 17:51   DG Abd Portable 1V  Result Date: 08/10/2020 CLINICAL DATA:  40 year old female status post feeding tube placement. EXAM: PORTABLE ABDOMEN - 1 VIEW COMPARISON:  Radiograph dated 05/27 FINDINGS: Feeding tube with tip just distal to the GE junction. Recommend further advancing. No bowel dilatation. The osseous structures and soft tissues are unremarkable. IMPRESSION: Feeding tube with tip just distal to the GE junction. Recommend further advancing. Electronically Signed   By: Elgie Collard M.D.   On: 08/10/2020  15:28   DG Abd Portable 1V  Result Date: 08/08/2020 CLINICAL DATA:  40 year old female status post feeding tube placement. EXAM: PORTABLE ABDOMEN - 1 VIEW COMPARISON:  Earlier radiograph dated 08/08/2020 FINDINGS: Feeding tube with tip likely in the region of the duodenal bulb. No other interval change. IMPRESSION: Feeding tube with tip likely in the region of the duodenal bulb. Electronically Signed   By: Elgie Collard M.D.   On: 08/08/2020 16:30   DG Abd Portable 1V  Result Date: 08/08/2020 CLINICAL DATA:  Feeding tube placement EXAM: PORTABLE ABDOMEN - 1 VIEW COMPARISON:  CT 08/02/2020 FINDINGS: Tip of a transesophageal feeding tube terminates to the right of midline, near the gastric antrum/duodenal bulb. Lung bases are clear. Bowel gas pattern is unremarkable. No acute or worrisome osseous or other soft tissue abnormalities. Telemetry leads overlie the abdomen and chest. IMPRESSION: Feeding tube tip terminates near the gastric antrum/duodenal bulb. Electronically Signed   By: Kreg Shropshire M.D.   On: 08/08/2020 15:32   EEG adult  Result Date: 08/11/2020 Charlsie Quest, MD     08/11/2020  3:49 PM Patient Name: Regina Morris MRN: [id number] Epilepsy Attending: Charlsie Quest Referring Physician/Provider: Dr Marvel Plan Date: 08/11/2020 Duration: 24.17 mins   Patient history: 40 year old female with right MCA infarct with hemorrhagic transformation with worsening mental status.  EEG to evaluate for seizures.   Level of alertness:  Comatose   AEDs during EEG study: Keppra   Technical aspects: This EEG study was done with scalp electrodes positioned according to the 10-20 International system of electrode placement. Electrical activity was acquired at a sampling rate of 500Hz  and reviewed with a high frequency filter of 70Hz  and a low frequency filter of 1Hz . EEG data were recorded continuously and digitally stored.   Description: EEG showed continuous generalized 3 to 5 Hz theta-delta slowing.  Run of sharp waves was noted in right frontocentral region without definite evolution suggestive of brief ictal-interictal rhythmic discharges.  Hyperventilation and photic stimulation were not performed.     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  Additionally there is severe diffuse encephalopathy, nonspecific etiology. No definite seizures  were seen throughout the recording.   Charlsie Quest    EEG adult  Result Date: 08/08/2020 Charlsie Quest, MD     08/08/2020  1:23 PM Patient Name: Regina Morris MRN: [id number] Epilepsy Attending: Charlsie Quest Referring Physician/Provider: Thelma Comp -Sharol Harness  Date: 08/08/2020 Duration: 23.13 mins Patient history: 40 year old female with right MCA infarct with hemorrhagic transformation with worsening mental status.  EEG to evaluate for seizures. Level of alertness: Awake, asleep AEDs during EEG study: Keppra Technical aspects: This EEG study was done with scalp electrodes positioned according to the 10-20 International system of electrode placement. Electrical activity was acquired at a sampling rate of  and reviewed with a high frequency filter of  and a low frequency filter of . EEG data were recorded continuously and digitally stored. Description: The posterior dominant rhythm consists of 7.5-8 Hz activity of moderate voltage (25-35 uV) seen predominantly in posterior head regions, asymmetric ( right<left) and reactive to eye opening and eye closing. Sleep was characterized by vertex waves, sleep spindles (12 to 14 Hz), maximal frontocentral region. EEG showed continuous 3 to 5 Hz theta-delta slowing in right hemisphere, maximal right frontal region. Intermittent 3 to 5 Hz theta-delta slowing was also noted in left hemisphere.   Sharp waves were noted in right frontal region.  Hyperventilation and photic stimulation were not performed.   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  Additionally there is evidence of cortical dysfunction in right hemisphere, maximal right frontal region likely secondary to underlying stroke. There is also mild diffuse encephalopathy, nonspecific etiology. No seizures were seen throughout the recording. Priyanka O Yadav   VAS [place] TRANSCRANIAL DOPPLER W BUBBLES  Result Date: 08/15/2020  Transcranial Doppler with Bubble Patient Name:  Regina Morris TEXTA  Date of Exam:   08/08/2020 Medical Rec #: [id number]            Accession #:    [id number] Date of Birth: 19-Mar-1980           Patient Gender: F Patient Age:   39Y Exam Location:  [facility] Procedure:      VAS [place] TRANSCRANIAL Demetrios Isaacs Referring Phys: [id number] PRAMOD S SETHI --------------------------------------------------------------------------------  Indications: Subarachnoid hemorrhage. Limitations: Patient unable to cooperate Comparison Study: No prior study Performing Technologist: Gertie Fey MHA, RDMS, RVT, RDCS  Examination Guidelines: A complete evaluation includes B-mode imaging, spectral Doppler, color Doppler, and power Doppler as needed of all accessible portions of each vessel. Bilateral testing is considered an integral part of a complete examination. Limited examinations for reoccurring indications may be performed as noted.  Summary: No HITS at rest or during manual Valsalva. Negative transcranial Doppler Bubble study with no evidence of right to left intracardiac communication.  A vascular evaluation was performed. The right middle cerebral artery was studied. An IV was inserted into the patient's right forearm. Verbal informed consent  was obtained.  *See table(s) above for TCD measurements and observations.  Diagnosing physician: Marvel Plan MD Electronically signed by Marvel Plan MD on 08/15/2020 at 1:43:54 PM.    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 EXAM: CT VENOGRAM HEAD TECHNIQUE: Venous phase CT of the head was performed after bolus administration of iodinated contrast. CONTRAST:  75mL OMNIPAQUE IOHEXOL 350 MG/ML SOLN COMPARISON:  Head CT from earlier today. FINDINGS: No enhancement of the superior sagittal sinus along most of its length with no enhancement seen in the right transverse, sigmoid, and upper IJ on the right-the dominant side. Cortical veins are largely enhancing, as are the paired deep veins. These results were called by  telephone at the time of interpretation on 08/10/2020 at 5:53 am to [facility] , who verbally acknowledged these results. 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 Electronically Signed   By: Marnee SpringJonathon  Watts M.D.   On: 08/10/2020 05:54   ECHOCARDIOGRAM COMPLETE BUBBLE STUDY  Result Date: 07/27/2020    ECHOCARDIOGRAM REPORT   Patient Name:   Regina Morris TEXTA Date of Exam: 07/29/2020 Medical Rec #:  [id number]           Height:       66.0 in Accession #:    [id number]          Weight:       164.9 lb Date of Birth:  1980-12-16          BSA:          1.842 m Patient Age:    39 years            BP:           131/74 mmHg Patient Gender: F                   HR:           73 bpm. Exam Location:  Inpatient Procedure: 2D Echo, Cardiac Doppler and Color Doppler Indications:    Stroke  History:        Patient has no prior history of Echocardiogram examinations.                 Signs/Symptoms:Altered Mental Status.  Sonographer:    Roosvelt Maserachel Lane RDCS Referring Phys: [id number] [facility] IMPRESSIONS  1. Left ventricular ejection  fraction, by estimation, is 60 to 65%. The left ventricle has normal function. The left ventricle has no regional wall motion abnormalities. Left ventricular diastolic parameters were normal.  2. Right ventricular systolic function is normal. The right ventricular size is normal. Tricuspid regurgitation signal is inadequate for assessing PA pressure.  3. The mitral valve is grossly normal. No evidence of mitral valve regurgitation. No evidence of mitral stenosis.  4. The aortic valve is tricuspid. Aortic valve regurgitation is not visualized. No aortic stenosis is present.  5. The inferior vena cava is normal in size with greater than 50% respiratory variability, suggesting right atrial pressure of 3 mmHg.  6. Agitated saline contrast bubble study was negative, with no evidence of any interatrial shunt. Conclusion(s)/Recommendation(s): No intracardiac source of embolism detected on this transthoracic study. A transesophageal echocardiogram is recommended to exclude cardiac source of embolism if clinically indicated. FINDINGS  Left Ventricle: Left ventricular ejection fraction, by estimation, is 60 to 65%. The left ventricle has normal function. The left ventricle has no regional wall motion abnormalities. The left ventricular internal cavity size was normal in size. There is  no left ventricular hypertrophy. Left ventricular diastolic parameters were normal. Right Ventricle: The right ventricular size is normal. No increase in right ventricular wall thickness. Right ventricular systolic function is normal. Tricuspid regurgitation signal is inadequate for assessing PA pressure. Left Atrium: Left atrial size was normal in size. Right Atrium: Right atrial size was normal in size. Pericardium: Trivial pericardial effusion is present. Mitral Valve: The mitral valve is grossly normal. No evidence of mitral valve regurgitation. No evidence of mitral valve stenosis. Tricuspid Valve: The tricuspid valve is grossly normal.  Tricuspid valve regurgitation is not demonstrated. No evidence of tricuspid stenosis. Aortic Valve: The aortic valve is tricuspid. Aortic valve  regurgitation is not visualized. No aortic stenosis is present. Aortic valve mean gradient measures 4.0 mmHg. Aortic valve peak gradient measures 6.9 mmHg. Aortic valve area, by VTI measures 2.74 cm. Pulmonic Valve: The pulmonic valve was grossly normal. Pulmonic valve regurgitation is not visualized. No evidence of pulmonic stenosis. Aorta: The aortic root and ascending aorta are structurally normal, with no evidence of dilitation. Venous: The right upper pulmonary vein is normal. The inferior vena cava is normal in size with greater than 50% respiratory variability, suggesting right atrial pressure of 3 mmHg. IAS/Shunts: The atrial septum is grossly normal. Agitated saline contrast was given intravenously to evaluate for intracardiac shunting. Agitated saline contrast bubble study was negative, with no evidence of any interatrial shunt.  LEFT VENTRICLE PLAX 2D LVIDd:         5.30 cm  Diastology LVIDs:         3.30 cm  LV e' medial:    10.40 cm/s LV PW:         0.50 cm  LV E/e' medial:  7.2 LV IVS:        0.50 cm  LV e' lateral:   11.50 cm/s LVOT diam:     1.90 cm  LV E/e' lateral: 6.5 LV SV:         73 LV SV Index:   40 LVOT Area:     2.84 cm  RIGHT VENTRICLE             IVC RV S prime:     18.50 cm/s  IVC diam: 1.40 cm TAPSE (M-mode): 1.9 cm LEFT ATRIUM             Index       RIGHT ATRIUM          Index LA diam:        3.70 cm 2.01 cm/m  RA Area:     7.98 cm LA Vol (A2C):   37.5 ml 20.35 ml/m RA Volume:   13.30 ml 7.22 ml/m LA Vol (A4C):   23.9 ml 12.97 ml/m LA Biplane Vol: 30.0 ml 16.28 ml/m  AORTIC VALVE AV Area (Vmax):    2.58 cm AV Area (Vmean):   2.40 cm AV Area (VTI):     2.74 cm AV Vmax:           131.00 cm/s AV Vmean:          89.700 cm/s AV VTI:            0.268 m AV Peak Grad:      6.9 mmHg AV Mean Grad:      4.0 mmHg LVOT Vmax:         119.00 cm/s  LVOT Vmean:        75.900 cm/s LVOT VTI:          0.259 m LVOT/AV VTI ratio: 0.97  AORTA Ao Root diam: 2.80 cm Ao Asc diam:  2.60 cm MITRAL VALVE MV Area (PHT): 4.68 cm    SHUNTS MV Decel Time: 162 msec    Systemic VTI:  0.26 m MV E velocity: 75.00 cm/s  Systemic Diam: 1.90 cm MV A velocity: 45.00 cm/s MV E/A ratio:  1.67 Lennie Odor MD Electronically signed by Lennie Odor MD Signature Date/Time: 09-05-2020/3:24:30 PM    Final    VAS US CAROTID  Result Date: 08/17/2020 Carotid Arterial Duplex Study Patient Name:  Regina Morris TEXTA  Date of Exam:   09/09/2020 Medical Rec #: [id number]  Accession #:    [id number] Date of Birth: 06/13/1980           Patient Gender: F Patient Age:   52Y Exam Location:  [facility] Procedure:      VAS US CAROTID Referring Phys: [id number] [facility] END --------------------------------------------------------------------------------  Indications:       Brain death, donor evaluation. Risk Factors:      Prior CVA. Other Factors:     Right sided craniectomy. Comparison Study:  08/08/20 Performing Technologist: Marilynne Halsted RDMS, RVT  Examination Guidelines: A complete evaluation includes B-mode imaging, spectral Doppler, color Doppler, and power Doppler as needed of all accessible portions of each vessel. Bilateral testing is considered an integral part of a complete examination. Limited examinations for reoccurring indications may be performed as noted.  Right Carotid Findings: +----------+--------+--------+--------+------------------+--------+           PSV cm/sEDV cm/sStenosisPlaque DescriptionComments +----------+--------+--------+--------+------------------+--------+ CCA Prox  83                                                 +----------+--------+--------+--------+------------------+--------+ CCA Distal85                                                 +----------+--------+--------+--------+------------------+--------+ ICA Prox  88                                                  +----------+--------+--------+--------+------------------+--------+ ICA Mid   28                                                 +----------+--------+--------+--------+------------------+--------+ ICA Distal21                                                 +----------+--------+--------+--------+------------------+--------+ ECA       154     14                                         +----------+--------+--------+--------+------------------+--------+ +----------+--------+-------+--------+-------------------+           PSV cm/sEDV cmsDescribeArm Pressure (mmHG) +----------+--------+-------+--------+-------------------+ [id number]                                        +----------+--------+-------+--------+-------------------+ +---------+--------+--+--------+ VertebralPSV cm/s44EDV cm/s +---------+--------+--+--------+  Left Carotid Findings: +----------+--------+--------+--------+------------------+--------+           PSV cm/sEDV cm/sStenosisPlaque DescriptionComments +----------+--------+--------+--------+------------------+--------+ CCA Prox  93                                                 +----------+--------+--------+--------+------------------+--------+  CCA Distal77                                                 +----------+--------+--------+--------+------------------+--------+ ICA Prox  68                                                 +----------+--------+--------+--------+------------------+--------+ ICA Mid   40                                                 +----------+--------+--------+--------+------------------+--------+ ICA Distal31                                                 +----------+--------+--------+--------+------------------+--------+ ECA       105     15                                          +----------+--------+--------+--------+------------------+--------+ +----------+--------+--------+--------+-------------------+           PSV cm/sEDV cm/sDescribeArm Pressure (mmHG) +----------+--------+--------+--------+-------------------+ [id number]                                         +----------+--------+--------+--------+-------------------+ +---------+--------+--+--------+ VertebralPSV cm/s34EDV cm/s +---------+--------+--+--------+   Summary: Right Carotid: Patent right carotid artery system with abnormal waveforms. Left Carotid: Patent left carotid artery system with abnormal waveforms.  *See table(s) above for measurements and observations.  Electronically signed by Fabienne Bruns MD on 08/17/2020 at 5:10:48 PM.    Final    VAS US CAROTID  Result Date: 08/08/2020 Carotid Arterial Duplex Study Patient Name:  Regina Morris [place]  Date of Exam:   2020-09-03 Medical Rec #: [id number]            Accession #:    [id number] Date of Birth: 1980-07-25           Patient Gender: F Patient Age:   39Y Exam Location:  [facility] Procedure:      VAS US CAROTID Referring Phys: [id number] [facility] --------------------------------------------------------------------------------  Indications:       CVA. Other Factors:     History of gestational diabetes, no other significant medical                    history noted. Comparison Study:  No prior studies. Performing Technologist: Jean Rosenthal RDMS,RVT  Examination Guidelines: A complete evaluation includes B-mode imaging, spectral Doppler, color Doppler, and power Doppler as needed of all accessible portions of each vessel. Bilateral testing is considered an integral part of a complete examination. Limited examinations for reoccurring indications may be performed as noted.  Right Carotid Findings: +----------+--------+--------+--------+------------------+------------------+           PSV cm/sEDV cm/sStenosisPlaque DescriptionComments            +----------+--------+--------+--------+------------------+------------------+ CCA  Prox  129     17                                intimal thickening +----------+--------+--------+--------+------------------+------------------+ CCA Distal115     25                                                   +----------+--------+--------+--------+------------------+------------------+ ICA Prox  95      24                                                   +----------+--------+--------+--------+------------------+------------------+ ICA Distal93      33                                                   +----------+--------+--------+--------+------------------+------------------+ ECA       142     25                                                   +----------+--------+--------+--------+------------------+------------------+ +----------+--------+-------+----------------+-------------------+           PSV cm/sEDV cmsDescribe        Arm Pressure (mmHG) +----------+--------+-------+----------------+-------------------+ Subclavian240            Multiphasic, WNL                    +----------+--------+-------+----------------+-------------------+ +---------+--------+--+--------+--+---------+ VertebralPSV cm/s70EDV cm/s19Antegrade +---------+--------+--+--------+--+---------+  Left Carotid Findings: +----------+--------+--------+--------+------------------+------------------+           PSV cm/sEDV cm/sStenosisPlaque DescriptionComments           +----------+--------+--------+--------+------------------+------------------+ CCA Prox  107     22                                intimal thickening +----------+--------+--------+--------+------------------+------------------+ CCA Distal90      21                                                   +----------+--------+--------+--------+------------------+------------------+ ICA Prox  64      18                                                    +----------+--------+--------+--------+------------------+------------------+ ICA Distal75      29                                                   +----------+--------+--------+--------+------------------+------------------+  ECA       104     20                                                   +----------+--------+--------+--------+------------------+------------------+ +----------+--------+--------+----------------+-------------------+           PSV cm/sEDV cm/sDescribe        Arm Pressure (mmHG) +----------+--------+--------+----------------+-------------------+ [id number]             Multiphasic, WNL                    +----------+--------+--------+----------------+-------------------+ +---------+--------+--+--------+--+---------+ VertebralPSV cm/s68EDV cm/s17Antegrade +---------+--------+--+--------+--+---------+   Summary: Right Carotid: The extracranial vessels were near-normal with only minimal wall                thickening or plaque. Left Carotid: The extracranial vessels were near-normal with only minimal wall               thickening or plaque. Vertebrals:  Bilateral vertebral arteries demonstrate antegrade flow. Subclavians: Normal flow hemodynamics were seen in bilateral subclavian              arteries. *See table(s) above for measurements and observations.  Electronically signed by Delia Heady MD on 08/08/2020 at 1:55:36 PM.    Final    CT CHEST ABDOMEN PELVIS WO CONTRAST  Result Date: 08/25/2020 CLINICAL DATA:  Cardiac transplant donor assessment. Please include liver measurements. EXAM: CT CHEST, ABDOMEN AND PELVIS WITHOUT CONTRAST TECHNIQUE: Multidetector CT imaging of the chest, abdomen and pelvis was performed following the standard protocol without IV contrast. COMPARISON:  Chest radiograph earlier today. Abdominopelvic CT 2020-09-01 FINDINGS: CT CHEST FINDINGS Cardiovascular: Left subclavian central venous  catheter tip in the lower SVC. The heart is normal in size. No pericardial effusion. Thoracic aorta is normal in caliber. The aorta at the aortic root measures 2.6 cm, mid ascending aorta 2.8 cm, distal ascending aorta just before the brachiocephalic take of 2.5 cm. Transverse aorta measures 2.1 cm, descending aorta at 2.2 cm. Conventional branching pattern from the aortic arch. No significant atherosclerosis. No visualized coronary artery calcifications. Mediastinum/Nodes: Endotracheal tube tip above the carina. Enteric tube decompressing the esophagus. There is no mediastinal adenopathy. No visualized thyroid nodule. No axillary adenopathy. Lungs/Pleura: Dependent opacities within both lower lobes, slightly nodular in appearance. Trace pleural thickening without significant effusion. 3 mm right middle lobe pulmonary nodule. Trachea and central bronchi are patent. Musculoskeletal: There are no acute or suspicious osseous abnormalities. CT ABDOMEN PELVIS FINDINGS Hepatobiliary: Homogeneous density on this noncontrast exam. No steatosis. Right lobe spans 19 cm in cranial caudal dimension. Left lobe spans 7 cm cranial caudal. Transverse dimension of 18.2 cm. AP dimension just below the main portal vein of 15.9 cm. Unremarkable gallbladder. No biliary dilatation. Pancreas: Slight fatty atrophy of the head and proximal body. No ductal dilatation or inflammation. No evidence of focal lesion. Spleen: Normal in size without focal abnormality. Adrenals/Urinary Tract: Normal adrenal glands. No hydronephrosis or renal calculi. No evidence of focal renal lesion. The right kidney measures 11.6 cm in sagittal dimension. The left kidney measures 12 cm in sagittal dimension. Decompressed urinary bladder by Foley catheter. Stomach/Bowel: Enteric tube within the stomach. Fluid/ingested material within the gastric body. Occasional fluid-filled loops of small bowel and colon without colonic wall thickening or inflammation. Normal  appendix. No obstruction. Vascular/Lymphatic: Normal caliber abdominal aorta.  No portal venous or mesenteric gas. No enlarged lymph nodes in the abdomen or pelvis. Left femoral catheter tip in the external iliac vein. Reproductive: Uterus and bilateral adnexa are unremarkable. Other: Trace free fluid in the pelvis. No free air. Skull flap in the right abdominal subcutaneous tissues. Musculoskeletal: There are no acute or suspicious osseous abnormalities. Mild sclerosis about the iliac aspects of both sacroiliac joints. IMPRESSION: 1. Normal heart size.  Normal caliber thoracic aorta. 2. Dependent opacities within both lower lobes, slightly nodular in appearance, may be atelectasis or aspiration. 3. Liver measurements as described above. Electronically Signed   By: Narda Rutherford M.D.   On: 08/31/2020 23:07    Microbiology No results found for this or any previous visit (from the past 240 hour(s)).  Lab Basic Metabolic Panel: No results for input(s): NA, K, CL, CO2, GLUCOSE, BUN, CREATININE, CALCIUM, MG, PHOS in the last 168 hours. Liver Function Tests: No results for input(s): AST, ALT, ALKPHOS, BILITOT, PROT, ALBUMIN in the last 168 hours. No results for input(s): LIPASE, AMYLASE in the last 168 hours. No results for input(s): AMMONIA in the last 168 hours. CBC: No results for input(s): WBC, NEUTROABS, HGB, HCT, MCV, PLT in the last 168 hours. Cardiac Enzymes: No results for input(s): CKTOTAL, CKMB, CKMBINDEX, TROPONINI in the last 168 hours. Sepsis Labs: No results for input(s): PROCALCITON, WBC, LATICACIDVEN in the last 168 hours.  Procedures/Operations  Mechanical ventilation. Decompressive hemicraniectomy. Organ retrieval.   Lynnell Catalan 08/25/2020, 9:21 AM

## 2020-09-12 NOTE — Progress Notes (Addendum)
NAME:  Regina Morris, MRN:  [id number], DOB:  01-10-1981, LOS: 7 ADMISSION DATE:  07/29/2020, CONSULTATION DATE:  09-12-20 REFERRING MD:  Stroke, Md, MD, CHIEF COMPLAINT:  Respiratory failure, tsroke  History of Present Illness:  Bronnie Vasseur is a 40 y.o. woman who presented with headache nausea and vomiting to [facility] on 5/26. 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 Transferred to [facility]. Started on keppra. Mental status worsened 5/27 evening and the morning of 5/28 CT head showed worsening midline shift. She was taken to the OR for decompressive hemicraniotomy with a pupillar exam concerning for impending herniation. She had rsection of non-viable brain tissue during hemicraniotomy and a bone flap was placed when she was closed.   History is obtained from chart review as the patient is intubated and not able to provide response.   Pertinent  Medical History  Gestational diabetes Nephrolithiasis  Significant Hospital Events: Including procedures, antibiotic start and stop dates in addition to other pertinent events   . 5/26 Presented to [facility] with headache, nausea, vomiting . 5/26 transferred to [facility], started on keppra and HTS . 5/27 possible neurologic decline, repeat CT head shows stability of bleeding, but increase in edema . 5/27 EEG epileptogenecity in the right frontal region and cortical dysfunction of the right hemisphere, no seizures . 5/27 cortrak placed . 5/28 overnight worsening midline shift and change in neuro exam, plan for decompressive hemicraniotomy . 5/30 No neuro change.  MRI.  Anticoagulation restarted.  PMT consulted.  Sedation stopped. . 5/31, some abnormal flexion movements in upper extremities, remains on heparin, tmax 101.6, weaning well on PSV 10/5; PMT met with family, continues to be full code . 6/1 blown right pupil overnight, left  sluggish, IV heparin held temporarily, repeat CTH unchanged, IV heparin restarted, towards end of day, worsening CTH and neuro exam, minimal response to noxious stimuli, tachypneic-went for repeat CTH with worsening herniation, Na 167  Interim History / Subjective:   Went into suspected DI yesterday evening.  Lost all brainstem reflexes overnight and became hypotensive now on NE 28 mcg/min via PIV despite DDAVP and mannitol   Remains full code despite multiple family discussions   Na 163 tmax 102.5-> coming down now 2.3L UOP / 12hrs  Net +5.4L   Objective   Blood pressure 124/74, pulse (!) 121, temperature 100.2 F (37.9 C), temperature source Axillary, resp. rate 15, height 5\' 6"  (1.676 m), weight 76.5 kg, last menstrual period 08/05/2020, SpO2 100 %.    Vent Mode: PRVC FiO2 (%):  [40 %] 40 % Set Rate:  [15 bmp] 15 bmp Vt Set:  [350 mL-540 mL] 350 mL PEEP:  [5 cmH20] 5 cmH20 Pressure Support:  [8 cmH20] 8 cmH20 Plateau Pressure:  [12 cmH20-16 cmH20] 13 cmH20   Intake/Output Summary (Last 24 hours) at Sep 12, 2020 0726 Last data filed at 09/12/20 0600 Gross per 24 hour  Intake 4203.32 ml  Output 4400 ml  Net -196.68 ml   Filed Weights   08/11/20 0423 08/13/20 0500 12-Sep-2020 0407  Weight: 80.3 kg 80.6 kg 76.5 kg    Examination: General:  Critically ill young adult hispanic female unresponsive on MV HEENT: MM pink/moist, ETT/ OGT, pupils fixed/ dilated, tense flap at right hemi-crani site, staples intact, absent corneals Neuro: unresponsive to noxious stimuli CV: ST, no murmur PULM:  apenic on PSV, CTA, no secretions GI:  soft, +bs, foley, right crani flap site wnl Extremities: warm/dry, generalized edema Skin: no rashes    Labs/imaging that I havepersonally reviewed  (right click and "Reselect all SmartList Selections" daily)   5/30 MRI brain >>  1. Extensive acute infarction throughout the right cerebral convexity, including right MCA and ACA territories. Exuberant  associated edema with areas of hemorrhagic transformation. The extent of hemorrhage and edema may be mildly progressed from the recent CT from Aug 10, 2020, although comparison is difficult across modalities. Repeat CT could along more direct comparison if clinically indicated. 2. Prior right craniectomy with brain herniating through the defect.  Slight paradoxical rightward midline shift as result (2 mm at the foramen of [place]). 3. Additional smaller infarcts in the left frontal parietal white matter and left occipital cortex. 4. Left parietal acute hemorrhage is likely similar. 5. Large right mastoid effusion. 6. Please see recent CTA and CTV for vascular findings.  [facility] 5/31 >> 1. Right greater than left hemorrhagic infarcts are stable from the MRI yesterday, progressed since the 08/10/2020 CT. 2. A small volume of intraventricular hemorrhage is new. But mass effect on the ventricles is stable with no ventriculomegaly. 3. Intracranial mass effect stable from yesterday, with leftward midline shift at the anterior septum pellucidum up to 6 mm and effaced suprasellar cistern. 4. Superior sagittal sinus thrombosis.  Right hemi craniectomy.  CTH 6/1 >> 1. Stable head CT from yesterday, aside from slightly improved patency of the suprasellar cistern. Leftward midline shift is stable at 6 to 7 mm.   - right side craniectomy following large hemorrhagic infarcts of the right ACA and MCA territories. Stable blood products and edema. Smaller hemorrhagic left frontal lobe infarcts are stable without mass effect. - stable small volume IVH without ventriculomegaly. 2. No new intracranial abnormality.  [place] 6/1 PM >> Increased intracranial pressure has progressed in the interval.  Progressive transtentorial herniation. There is further effacement of the basilar cisterns. Increased edema in the left hemisphere and in the midbrain compared to earlier today.  Large hemorrhagic right hemispheric infarct  unchanged. 9 mm midline shift to the left slightly increased  CBC: WBC 16.5-> 13.8-> 14.4, Hgb 7.4-> 7.2-> 7.5, plt stable BMET: Na 167-> 171-> 170- 163, Cl > 130, sCr 0.67-> 1.21 Heparin level this am wnl->  0.4-> 0.71 Glucose trend back > 200  Resolved Hospital Problem list     Assessment & Plan:   Acute right MCA territory hemorrhage with edema, midline shift, hemorrhagic conversion Right decompressive hemicraniotomy with bone flap Acute hypoxemic respiratory failure Dural venous sinus thrombus Acute blood loss anemia Hypernatremia Hypokalemia  Hyperglycemia/ New dx diabetes, HA1c 12.9 Plan: -Appears to have progressed to brain death overnight despite all aggressive measures, however given multiple metabolic derangements, she will go for NM cerebral brain flow study for confirmation testing.   - LR 1L bolus now - continue NE for BP support for now, will defer CVL placement for now - continue full MV support and ongoing supportive care - discussed with Neurology/ primary  - hypercoagulable panel -> neg thus far (except antithrombin III which was high), MTHFR still pending  - family expected later today despite multiple telephone calls on poor prognosis, remains full code.   Best practice (right click and "Reselect all SmartList Selections" daily)  Diet:  Tube Feed  Pain/Anxiety/Delirium protocol (if indicated): Yes (RASS goal 0) VAP protocol (if indicated): Yes DVT prophylaxis: Systemic AC- held for now GI prophylaxis: H2B Glucose control:  SSI Yes Central venous  access:  N/A Arterial line:  N/A Foley:  N/A Mobility:  bed rest  PT consulted: N/A Last date of multidisciplinary goals of care discussion ongoing, per primary Neurology/ PMT Code Status:  full code Disposition: ICU   Labs   CBC: Recent Labs  Lab 08/10/20 0619 08/10/20 1632 08/11/20 0408 08/11/20 0425 08/11/20 9371 08/12/20 0405 08/12/20 0409 08/13/20 0327 09/11/2020 0058  WBC 21.3*  --  17.4*  --    --   --  16.5* 13.8* 14.4*  HGB 6.2*   < > 7.4*   < > 7.7* 7.5* 7.4* 7.2* 7.5*  HCT 23.8*   < > 26.8*   < > 27.3* 22.0* 26.4* 25.6* 29.0*  MCV 63.1*  --  67.0*  --   --   --  66.5* 66.5* 71.8*  PLT 400  --  286  --   --   --  291 257 326   < > = values in this interval not displayed.    Basic Metabolic Panel: Recent Labs  Lab 08/08/20 0253 08/08/20 0939 08/10/20 0619 08/10/20 1031 08/10/20 1632 08/11/20 0408 08/11/20 0425 08/11/20 0856 08/11/20 1418 08/12/20 0405 08/12/20 0409 08/12/20 1542 08/13/20 0327 08/13/20 1012 08/13/20 1531 08/13/20 1927 09/10/2020 0058  NA 143   < > 160*  --    < > 155* 158* 157*   < > 159* 155*  155*   < > 155* 158* 167* 171* 170*  K 3.6   < > 2.6*  --   --  3.0* 3.0*  --   --  3.4* 3.4*  --  3.3*  --   --   --  4.2  CL 113*   < > >130*  --   --  126*  --   --   --   --  123*  --  119*  --   --   --  >130*  CO2 20*   < > 23  --   --  21*  --   --   --   --  25  --  27  --   --   --  29  GLUCOSE 194*   < > 125*  --   --  175*  --   --   --   --  316*  --  288*  --   --   --  286*  BUN 10   < > 9  --   --  10  --   --   --   --  10  --  13  --   --   --  19  CREATININE 0.53   < > 0.70  --   --  0.73  --   --   --   --  0.66  --  0.67  --   --   --  1.21*  CALCIUM 8.2*   < > 8.0*  --   --  8.0*  --   --   --   --  7.9*  --  8.2*  --   --   --  8.4*  MG 2.0  --   --  1.8  --   --   --   --   --   --  2.3  --   --   --   --   --  2.7*  PHOS 1.9*  --   --   --   --   --   --  2.2*  --   --   --   --   --   --   --   --  7.0*   < > = values in this interval not displayed.   GFR: Estimated Creatinine Clearance: 65.9 mL/min (A) (by C-G formula based on SCr of 1.21 mg/dL (H)). Recent Labs  Lab 08/11/20 0408 08/12/20 0409 08/13/20 0327 2020/08/18 0058  WBC 17.4* 16.5* 13.8* 14.4*    Liver Function Tests: No results for input(s): AST, ALT, ALKPHOS, BILITOT, PROT, ALBUMIN in the last 168 hours. No results for input(s): LIPASE, AMYLASE in the last 168  hours. No results for input(s): AMMONIA in the last 168 hours.  ABG    Component Value Date/Time   PHART 7.491 (H) 08/12/2020 0405   PCO2ART 35.6 08/12/2020 0405   PO2ART 182 (H) 08/12/2020 0405   HCO3 26.9 08/12/2020 0405   TCO2 28 08/12/2020 0405   ACIDBASEDEF 1.0 08/11/2020 0425   O2SAT 100.0 08/12/2020 0405     Coagulation Profile: No results for input(s): INR, PROTIME in the last 168 hours.  Cardiac Enzymes: No results for input(s): CKTOTAL, CKMB, CKMBINDEX, TROPONINI in the last 168 hours.  HbA1C: Hgb A1c MFr Bld  Date/Time Value Ref Range Status  07/14/2020 07:55 AM 12.9 (H) 4.8 - 5.6 % Final    Comment:    (NOTE)         Prediabetes: 5.7 - 6.4         Diabetes: >6.4         Glycemic control for adults with diabetes: <7.0   12/12/2019 09:57 PM 11.7 (H) 4.8 - 5.6 % Final    Comment:    (NOTE) Pre diabetes:          5.7%-6.4%  Diabetes:              >6.4%  Glycemic control for   <7.0% adults with diabetes    CCT: 30 mins   Kennieth Rad, ACNP [facility] 08-18-20, 7:26 AM

## 2020-09-12 NOTE — Procedures (Signed)
Bronchoscopy Procedure Note  Regina Morris  [id number]  05/05/1980  Date:08-23-20  Time:7:17 PM   Provider Performing:Ruel Dimmick   Procedure(s):  Flexible bronchoscopy with bronchial alveolar lavage (48250)  Indication(s) Organ donation assessment  Consent Risks of the procedure as well as the alternatives and risks of each were explained to the patient and/or caregiver.  Consent for the procedure was obtained and is signed in the bedside chart  Anesthesia none   Time Out Verified patient identification, verified procedure, site/side was marked, verified correct patient position, special equipment/implants available, medications/allergies/relevant history reviewed, required imaging and test results available.   Sterile Technique Usual hand hygiene, masks, gowns, and gloves were used   Procedure Description Bronchoscope advanced through endotracheal tube and into airway.  Airways were examined down to subsegmental level with findings noted below.   Following diagnostic evaluation, BAL(s) performed in 30 with normal saline and return of 25 fluid  Findings: normal examination   Complications/Tolerance None; patient tolerated the procedure well. Chest X-ray is not needed post procedure.   EBL none   Specimen(s) BAL x 2  Kipp Brood, MD [facility] ICU Physician [place]  Pager: [phone] Or Epic Secure Chat After hours: [phone].  08-23-20, 7:18 PM

## 2020-09-12 NOTE — Progress Notes (Signed)
Date and time results received: 09/11/2020 0330  Test: Sodium Critical Value: 170  Test: Chloride Critical Value: >130   Name of Provider Notified: Lindzen   Orders Received? Or Actions Taken?: This RN notified Dr. Otelia Limes. No interventions needed at this time. Maintain fluids at current rate. No new orders. Will continue to monitor patient.   Harriett Sine, RN

## 2020-09-12 NOTE — Progress Notes (Signed)
Pt was taken to nuclear med and back to 4N27 without complication.

## 2020-09-12 NOTE — Procedures (Signed)
Central Venous Catheter Insertion Procedure Note  Regina Morris  [id number]  08/26/1980  Date:08/19/2020  Time:7:15 PM   Provider Performing:Odie Rauen   Procedure: Insertion of Non-tunneled Central Venous [id number]) with US guidance (93267)   Indication(s) Medication administration and Difficult access  Consent Risks of the procedure as well as the alternatives and risks of each were explained to the patient and/or caregiver.  Consent for the procedure was obtained and is signed in the bedside chart  Anesthesia Organ donor  Timeout Verified patient identification, verified procedure, site/side was marked, verified correct patient position, special equipment/implants available, medications/allergies/relevant history reviewed, required imaging and test results available.  Sterile Technique Maximal sterile technique including full sterile barrier drape, hand hygiene, sterile gown, sterile gloves, mask, hair covering, sterile ultrasound probe cover (if used).  Procedure Description Area of catheter insertion was cleaned with chlorhexidine and draped in sterile fashion.  With real-time ultrasound guidance a central venous catheter was placed into the left subclavian vein. Nonpulsatile blood flow and easy flushing noted in all ports.  The catheter was sutured in place and sterile dressing applied.      Complications/Tolerance None; patient tolerated the procedure well. Chest X-ray is ordered to verify placement for internal jugular or subclavian cannulation.   Chest x-ray is not ordered for femoral cannulation.  EBL Minimal  Specimen(s) None  Lynnell Catalan, MD [facility] ICU Physician [facility] [place] [facility]  Pager: [phone] Or Epic Secure Chat After hours: [phone].  08/19/2020, 7:16 PM

## 2020-09-12 NOTE — Progress Notes (Signed)
ANTICOAGULATION CONSULT NOTE  Pharmacy Consult:  Heparin Indication:  Cerebral venous sinus thrombosis  Labs: Recent Labs    08/11/20 0408 08/11/20 0425 08/12/20 0409 08/12/20 1219 08/13/20 0327 08/13/20 1755 08/21/2020 0058  HGB 7.4*   < > 7.4*  --  7.2*  --  7.5*  HCT 26.8*   < > 26.4*  --  25.6*  --  29.0*  PLT 286  --  291  --  257  --  326  HEPARINUNFRC  --    < > 0.12*   < > 0.40 0.39 0.71*  CREATININE 0.73  --  0.66  --  0.67  --   --    < > = values in this interval not displayed.    Assessment: 40 yr old female with dural venous sinus thrombosis for heparin   Goal of Therapy:  Heparin level 0.3-0.5 units/ml Monitor platelets by anticoagulation protocol: Yes   Plan:  Decrease Heparin 1200 units/hr Check heparin level in 8 hours.  Geannie Risen, PharmD, BCPS  09/03/2020 3:21 AM

## 2020-09-12 NOTE — Progress Notes (Signed)
Around 2345 patient became tachycardic, with heart rate in the 140s. Fentanyl IV of was given 2348. No change in response to medication. Patient's BP was cycled at 0000 and it dropped to 80/47 with MAP of 55. Patient suspected to be herniating. New neuro assessment showed pupils at 10mm and non reactive, no cough reflex, and no corneal reflexes.   CCM and neurology were both paged. CCM added levophed and a one time dose of mannitol. Dr. Warrick Parisian stated he would call and address code status with the patient's family.   Levophed has been started. Currently running at 28 and BP is 106/59 with MAP of 74. Will continue to monitor.   Harriett Sine, RN

## 2020-09-12 NOTE — Progress Notes (Signed)
eLink Physician-Brief Progress Note Patient Name: Regina Morris DOB: [birth year] MRN: [id number]   Date of Service  09/05/2020  HPI/Events of Note  Patient with imminent herniation secondary to intracranial hypertension, she is hypernatremic from prior treatment with hypertonic saline, and hypotensive.  eICU Interventions  Mannitol 75 gm iv bolus ordered, Norepinephrine started for hypotension, I will call next of kin to discuss possible DNR but neurologist talked to him earlier unsuccessfully. Prognosis is abysmal.        Thomasene Lot Herve Haug 08/29/2020, 12:18 AM

## 2020-09-12 NOTE — Progress Notes (Addendum)
Subjective: Patient remains intubated and unresponsive. Not on sedation. Overnight, the patient had an episode of tachycardia and hypotension with suspected herniation. Neuro exam revealed pupils at 34mm and non-reactive with no cough reflex, and no corneal reflexes  Objective: Vital signs in last 24 hours: Temp:  [100.2 F (37.9 C)-102.9 F (39.4 C)] 100.2 F (37.9 C) (06/02 0400) Pulse Rate:  [68-142] 122 (06/02 0700) Resp:  [5-37] 15 (06/02 0700) BP: (74-163)/(40-88) 115/71 (06/02 0744) SpO2:  [98 %-100 %] 99 % (06/02 0744) FiO2 (%):  [40 %] 40 % (06/02 0744) Weight:  [76.5 kg] 76.5 kg (06/02 0407)  Intake/Output from previous day: 06/01 0701 - 06/02 0700 In: 4203.3 [I.V.:1763.3; NG/GT:2065; IV Piggyback:375] Out: 4400 [Urine:4400] Intake/Output this shift: No intake/output data recorded.  Physical Exam: Patient is intubated and unresponsive, not currently on any sedation. She is on full support ventilation and is not breathing over the set ventilation rate. No eye opening to noxious stimuli. Not able to follow commands. B/L pupils 60mm and nonreactive, absent bilateral corneals, cough, and gag reflex. No movement to noxious stimuli in her BUE and BLE.   Lab Results: Recent Labs    08/13/20 0327 08/18/2020 0058  WBC 13.8* 14.4*  HGB 7.2* 7.5*  HCT 25.6* 29.0*  PLT 257 326   BMET Recent Labs    08/13/20 0327 08/13/20 1012 08/25/2020 0058 08/19/2020 0639  NA 155*   < > 170* 163*  K 3.3*  --  4.2  --   CL 119*  --  >130*  --   CO2 27  --  29  --   GLUCOSE 288*  --  286*  --   BUN 13  --  19  --   CREATININE 0.67  --  1.21*  --   CALCIUM 8.2*  --  8.4*  --    < > = values in this interval not displayed.    Studies/Results: CT HEAD WO CONTRAST  Addendum Date: 08/13/2020   ADDENDUM REPORT: 08/13/2020 17:52 ADDENDUM: These results were called by telephone at the time of interpretation on 08/13/2020 at 5:52 pm to provider XU, who verbally acknowledged these results.  Electronically Signed   By: Marlan Palau M.D.   On: 08/13/2020 17:52   Result Date: 08/13/2020 CLINICAL DATA:  Mental status change.  Stroke. EXAM: CT HEAD WITHOUT CONTRAST TECHNIQUE: Contiguous axial images were obtained from the base of the skull through the vertex without intravenous contrast. COMPARISON:  CT head 08/13/2020 FINDINGS: Brain: Increased intracranial pressure with further effacement of the basilar cisterns compared to the prior study. There is further compression of the lateral ventricles. There appears to be progressive edema in the left frontal and temporal lobe compared to the prior study. 9 mm midline shift to the left slightly increased. There is progressive edema in the midbrain and right thalamus. Large hemorrhagic right hemispheric infarct is similar to the prior study. There has been a right craniectomy with considerable brain swelling through the craniectomy defect. Small area of hypodense infarct in the left occipital lobe unchanged. Small hemorrhagic infarct in the left frontal parietal lobe is unchanged. Mild subarachnoid hemorrhage on the left over the convexity. Small amount of blood in the third ventricle is unchanged. Vascular: Negative for hyperdense vessel Skull: Large right-sided craniectomy unchanged. Sinuses/Orbits: Negative Other: None IMPRESSION: Increased intracranial pressure has progressed in the interval. Progressive transtentorial herniation. There is further effacement of the basilar cisterns. Increased edema in the left hemisphere and in the midbrain compared to  earlier today. Large hemorrhagic right hemispheric infarct unchanged. 9 mm midline shift to the left slightly increased. Electronically Signed: By: Marlan Palau M.D. On: 08/13/2020 17:38   CT HEAD WO CONTRAST  Result Date: 08/13/2020 CLINICAL DATA:  40 year old female with hemorrhagic right hemispheric infarcts, dural sinus thrombosis, craniectomy. EXAM: CT HEAD WITHOUT CONTRAST TECHNIQUE: Contiguous  axial images were obtained from the base of the skull through the vertex without intravenous contrast. COMPARISON:  08/12/2020 head CT and earlier. FINDINGS: Brain: Confluent right ACA and MCA infarcts with malignant hemorrhagic transformation and abundant cytotoxic edema. Stable extent of brain herniation through the craniectomy. Leftward midline shift at the anterior septum pellucidum remains stable at 6 to 7 mm (series 3, image 8). Improved patency of the suprasellar cistern since yesterday. Remaining basilar cisterns are stable. Incidental mega cisterna magna, normal variant. Stable mass effect on the right lateral ventricle. No trapped or enlarged ventricles. Small volume of IVH is stable since yesterday, including in the 3rd and 4th ventricles. Smaller hemorrhagic infarcts in the left frontal lobe are stable (series 3, image 14) with no significant mass effect. Stable gray-white matter differentiation elsewhere. Vascular: Stable. Skull: Stable.  Right craniectomy. Sinuses/Orbits: Stable right middle ear and mastoid opacification. Other Visualized paranasal sinuses and mastoids are stable and well aerated. Other: Stable postoperative changes to the scalp.  Negative orbits. IMPRESSION: 1. Stable head CT from yesterday, aside from slightly improved patency of the suprasellar cistern. Leftward midline shift is stable at 6 to 7 mm. - right side craniectomy following large hemorrhagic infarcts of the right ACA and MCA territories. Stable blood products and edema. Smaller hemorrhagic left frontal lobe infarcts are stable without mass effect. - stable small volume IVH without ventriculomegaly. 2. No new intracranial abnormality. Electronically Signed   By: Odessa Fleming M.D.   On: 08/13/2020 06:56   DG Abd Portable 1V  Result Date: 08/13/2020 CLINICAL DATA:  Check feeding catheter placement EXAM: PORTABLE ABDOMEN - 1 VIEW COMPARISON:  08/11/2020 FINDINGS: Scattered large and small bowel gas is noted. Gastric catheter has  been removed. New weighted feeding catheter is noted in the distal stomach directed towards the duodenal bulb. Scattered large and small bowel gas is noted. No free air is seen. IMPRESSION: Feeding catheter in the distal stomach/duodenal bulb. Electronically Signed   By: Alcide Clever M.D.   On: 08/13/2020 16:03    Assessment/Plan: 40 y.o. female with right posterior MCA infarct with hemorrhagic conversion who is post op day #5 s/p right decompressive hemicraniectomy. Patient with continued decline in her neurological status. Heparin gtt restarted per neurology. Overnight patient had a decline in her neuro examination and had episode of tachycardia and hypotension. CCM added levophed and a one-time dose of mannitol, and attempted to discuss code status with family. Repeat CTH revealed progressing transtentorial herniation. There is further effacement of the basilar cisterns. Increased edema in the left hemisphere and in the midbrain compared to earlier.  Large hemorrhagic right hemispheric infarct unchanged. 9 mm midline shift to the left slightly increased. Plan for further discussions with family. Patient to undergo NM cerebral brain flow study today. No new neurosurgical recommendations at this time. Continue supportive care.   LOS: 7 days     Council Mechanic, DNP, NP-C 09/06/2020, 8:14 AM  Patient is doing poorly.  End-of-life discussions and planning per family conferences.

## 2020-09-12 NOTE — Procedures (Signed)
Arterial Catheter Insertion Procedure Note  Regina Morris  [id number]  07/02/1980  Date:09/11/2020  Time:7:17 PM    Provider Performing: Lynnell Catalan    Procedure: Insertion of Arterial Line (54650) without US guidance  Indication(s) Blood pressure monitoring and/or need for frequent ABGs  Consent Risks of the procedure as well as the alternatives and risks of each were explained to the patient and/or caregiver.  Consent for the procedure was obtained and is signed in the bedside chart  Anesthesia None   Time Out Verified patient identification, verified procedure, site/side was marked, verified correct patient position, special equipment/implants available, medications/allergies/relevant history reviewed, required imaging and test results available.   Sterile Technique Maximal sterile technique including full sterile barrier drape, hand hygiene, sterile gown, sterile gloves, mask, hair covering, sterile ultrasound probe cover (if used).   Procedure Description Area of catheter insertion was cleaned with chlorhexidine and draped in sterile fashion. Without real-time ultrasound guidance an arterial catheter was placed into the left femoral artery.  Appropriate arterial tracings confirmed on monitor.     Complications/Tolerance None; patient tolerated the procedure well.   EBL Minimal   Specimen(s) None  Lynnell Catalan, MD [facility] ICU Physician [facility]  Pager: [phone] Or Epic Secure Chat After hours: [phone].  08/16/2020, 7:17 PM

## 2020-09-12 NOTE — Progress Notes (Signed)
STROKE TEAM PROGRESS NOTE   INTERVAL HISTORY RN is at the bedside. Pt has no more brainstem reflexes, no movement on pain, cold caloric no nystagmus seen, consistent with clinical brain death. Will do brain flow study. Husband will be here in the afternoon.   Vitals:   08/17/2020 0700 08/20/2020 0744 08/13/2020 0800 08/19/2020 1155  BP: 114/70 115/71 127/73   Pulse: (!) 122  (!) 120   Resp: 15  15   Temp:   99.1 F (37.3 C)   TempSrc:   Axillary   SpO2: 100% 99% 99% 100%  Weight:      Height:       CBC:  Recent Labs  Lab 08/13/20 0327 09/10/2020 0058  WBC 13.8* 14.4*  HGB 7.2* 7.5*  HCT 25.6* 29.0*  MCV 66.5* 71.8*  PLT 257 326   Basic Metabolic Panel:  Recent Labs  Lab 08/11/20 0856 08/11/20 1418 08/12/20 0409 08/12/20 1542 08/13/20 0327 08/13/20 1012 09/01/2020 0058 08/23/2020 0639  NA 157*   < > 155*  155*   < > 155*   < > 170* 163*  K  --    < > 3.4*  --  3.3*  --  4.2  --   CL  --   --  123*  --  119*  --  >130*  --   CO2  --   --  25  --  27  --  29  --   GLUCOSE  --   --  316*  --  288*  --  286*  --   BUN  --   --  10  --  13  --  19  --   CREATININE  --   --  0.66  --  0.67  --  1.21*  --   CALCIUM  --   --  7.9*  --  8.2*  --  8.4*  --   MG  --   --  2.3  --   --   --  2.7*  --   PHOS 2.2*  --   --   --   --   --  7.0*  --    < > = values in this interval not displayed.   Lipid Panel:  No results for input(s): CHOL, TRIG, HDL, CHOLHDL, VLDL, LDLCALC in the last 168 hours. HgbA1c:  No results for input(s): HGBA1C in the last 168 hours. Urine Drug Screen:  Recent Labs  Lab August 24, 2020 1258  LABOPIA NONE DETECTED  COCAINSCRNUR NONE DETECTED  LABBENZ NONE DETECTED  AMPHETMU NONE DETECTED  THCU NONE DETECTED  LABBARB NONE DETECTED    Alcohol Level  No results for input(s): ETH in the last 168 hours.  IMAGING  CT ABDOMEN PELVIS WO CONTRAST Result Date: 24-Aug-2020 IMPRESSION: No acute intra-abdominal pathology identified.   DG Chest 2 View Result Date:  24-Aug-2020 MPRESSION: No active cardiopulmonary disease.   CT Head Wo Contrast Result Date: 08/24/20 IMPRESSION: Large posterior right MCA territory infarct involving the posterior right temporal cortex with superimposed moderate subarachnoid hemorrhage interdigitating within the sulci of the infarcted region and tiny subdural hematoma. Mild mass effect. No midline shift.   MR ANGIO HEAD WO CONTRAST Result Date: 24-Aug-2020 IMPRESSION:  1. Significantly degraded by motion.  2. Narrowed/irregularity right M2 branches correlating with the acute infarct. No detectable arterial disease elsewhere.   MR BRAIN WO CONTRAST Result Date: 08-24-2020 CT. IMPRESSION:  1. Acute infarct involving the inferior division right MCA territory  with petechial hemorrhage and 4 mm midline shift.  2. Diffusion only study due to patient condition.   CT HEAD WO CONTRAST  Addendum Date: 08/13/2020   ADDENDUM REPORT: 08/13/2020 17:52 ADDENDUM: These results were called by telephone at the time of interpretation on 08/13/2020 at 5:52 pm to provider Durga Saldarriaga, who verbally acknowledged these results. Electronically Signed   By: Marlan Palauharles  Clark M.D.   On: 08/13/2020 17:52   Result Date: 08/13/2020 CLINICAL DATA:  Mental status change.  Stroke. EXAM: CT HEAD WITHOUT CONTRAST TECHNIQUE: Contiguous axial images were obtained from the base of the skull through the vertex without intravenous contrast. COMPARISON:  CT head 08/13/2020 FINDINGS: Brain: Increased intracranial pressure with further effacement of the basilar cisterns compared to the prior study. There is further compression of the lateral ventricles. There appears to be progressive edema in the left frontal and temporal lobe compared to the prior study. 9 mm midline shift to the left slightly increased. There is progressive edema in the midbrain and right thalamus. Large hemorrhagic right hemispheric infarct is similar to the prior study. There has been a right craniectomy with  considerable brain swelling through the craniectomy defect. Small area of hypodense infarct in the left occipital lobe unchanged. Small hemorrhagic infarct in the left frontal parietal lobe is unchanged. Mild subarachnoid hemorrhage on the left over the convexity. Small amount of blood in the third ventricle is unchanged. Vascular: Negative for hyperdense vessel Skull: Large right-sided craniectomy unchanged. Sinuses/Orbits: Negative Other: None IMPRESSION: Increased intracranial pressure has progressed in the interval. Progressive transtentorial herniation. There is further effacement of the basilar cisterns. Increased edema in the left hemisphere and in the midbrain compared to earlier today. Large hemorrhagic right hemispheric infarct unchanged. 9 mm midline shift to the left slightly increased. Electronically Signed: By: Marlan Palauharles  Clark M.D. On: 08/13/2020 17:38   CT HEAD WO CONTRAST  Result Date: 08/13/2020 CLINICAL DATA:  40 year old female with hemorrhagic right hemispheric infarcts, dural sinus thrombosis, craniectomy. EXAM: CT HEAD WITHOUT CONTRAST TECHNIQUE: Contiguous axial images were obtained from the base of the skull through the vertex without intravenous contrast. COMPARISON:  08/12/2020 head CT and earlier. FINDINGS: Brain: Confluent right ACA and MCA infarcts with malignant hemorrhagic transformation and abundant cytotoxic edema. Stable extent of brain herniation through the craniectomy. Leftward midline shift at the anterior septum pellucidum remains stable at 6 to 7 mm (series 3, image 8). Improved patency of the suprasellar cistern since yesterday. Remaining basilar cisterns are stable. Incidental mega cisterna magna, normal variant. Stable mass effect on the right lateral ventricle. No trapped or enlarged ventricles. Small volume of IVH is stable since yesterday, including in the 3rd and 4th ventricles. Smaller hemorrhagic infarcts in the left frontal lobe are stable (series 3, image 14) with  no significant mass effect. Stable gray-white matter differentiation elsewhere. Vascular: Stable. Skull: Stable.  Right craniectomy. Sinuses/Orbits: Stable right middle ear and mastoid opacification. Other Visualized paranasal sinuses and mastoids are stable and well aerated. Other: Stable postoperative changes to the scalp.  Negative orbits. IMPRESSION: 1. Stable head CT from yesterday, aside from slightly improved patency of the suprasellar cistern. Leftward midline shift is stable at 6 to 7 mm. - right side craniectomy following large hemorrhagic infarcts of the right ACA and MCA territories. Stable blood products and edema. Smaller hemorrhagic left frontal lobe infarcts are stable without mass effect. - stable small volume IVH without ventriculomegaly. 2. No new intracranial abnormality. Electronically Signed   By: Althea GrimmerH  Hall M.D.  On: 08/13/2020 06:56   NM Brain Img Min 4 Views  Result Date: 08/28/2020 CLINICAL DATA:  Hemorrhagic stroke, clinical brain death EXAM: NM BRAIN SCAN - 4+ VIEW TECHNIQUE: Radionuclide angiogram and static images of the brain were obtained after intravenous injection of radiopharmaceutical. RADIOPHARMACEUTICALS:  21.8 millicuries Tc-58m HMPAO IV COMPARISON:  CT head 08/13/2020 FINDINGS: Blood flow images demonstrate no intracranial perfusion. Flow is identified to the scalp except for an area in the RIGHT parietal region corresponding to site of craniectomy and marked cerebral swelling. Delayed static images show no localization of tracer within brain parenchyma. Findings are consistent with brain death. IMPRESSION: No intracranial blood flow or brain uptake of tracer. Findings are consistent with brain death. Electronically Signed   By: Ulyses Southward M.D.   On: 08/13/2020 11:44   DG Abd Portable 1V  Result Date: 08/13/2020 CLINICAL DATA:  Check feeding catheter placement EXAM: PORTABLE ABDOMEN - 1 VIEW COMPARISON:  08/11/2020 FINDINGS: Scattered large and small bowel gas is noted.  Gastric catheter has been removed. New weighted feeding catheter is noted in the distal stomach directed towards the duodenal bulb. Scattered large and small bowel gas is noted. No free air is seen. IMPRESSION: Feeding catheter in the distal stomach/duodenal bulb. Electronically Signed   By: Alcide Clever M.D.   On: 08/13/2020 16:03      PHYSICAL EXAM  Temp:  [99.1 F (37.3 C)-102.9 F (39.4 C)] 99.1 F (37.3 C) (06/02 0800) Pulse Rate:  [74-142] 120 (06/02 0800) Resp:  [5-37] 15 (06/02 0800) BP: (74-156)/(40-88) 127/73 (06/02 0800) SpO2:  [98 %-100 %] 100 % (06/02 1155) FiO2 (%):  [40 %] 40 % (06/02 1155) Weight:  [76.5 kg] 76.5 kg (06/02 0407)  General - Well nourished, well developed, intubated off sedation.  Ophthalmologic - fundi not visualized due to noncooperation.  Cardiovascular - Regular rhythm and rate.    Neuro - intubated off sedation, eyes closed, not open eyes on stimulation, not following commands. With forced eye opening, eyes in mid position, not blinking to visual threat, doll's eyes sluggish, not tracking, pupil equal size 9mm->4mm, tonic pupil, sluggish to light. Corneal reflex weakly present bilaterally, gag and cough strongly present. Breathing over the vent.  Facial symmetry not able to test due to ET tube.  Tongue protrusion not cooperative. On pain stimulation, BUEs extension posturing, BLEs triple reflexes. DTR diminished and no babinski. Sensation, coordination and gait not tested.   ASSESSMENT/PLAN Ms. Markesha Hannig is a 40 y.o. female with history of gestational diabetes, kidney stone  presenting with nausea, vomiting and R temple headache.  CT head demonstrated a right posterior MCA stroke with moderate subarachnoid hemorrhage interdigitating within the sulci of the infarcted region and tiny SDH with mild mass effect.   Stroke:  right posterior MCA infarct with hemorrhagic conversion likely due to large vessel source given uncontrolled risk  factors  MRI  Acute infarct involving the inferior division right MCA territory with petechial hemorrhage and 4 mm midline shift.   MRA Narrowed/irregularity right M2 branches correlating with the acute infarct.  CTA head and neck 5/29 - Very attenuated right MCA branches similar to prior MRA with high-grade right M1 segment narrowing and no enhancing vessels at the level of initially visualized infarct.  MRI 5/30 showed Extensive acute infarction throughout the right cerebral convexity, including right MCA and ACA territories. Exuberant associated edema with areas of hemorrhagic transformation. The extent of hemorrhage and edema may be mildly progressed  CT head 5/31  Right greater than left hemorrhagic infarcts are stable from the MRI yesterday. Intracranial mass effect stable from yesterday, with leftward midline shift at the anterior septum pellucidum up to 6 mm and effaced suprasellar cistern. Superior sagittal sinus thrombosis.  CT 6/1 no new bleeding or infarct but increased cerebral edema and MLS.   Carotid Doppler unremarkable  2D Echo EF 60-65%   SARs coronavirus negative  Hypercoag work up so far negative  TCD bubble study no PFO  LDL 197  HgbA1c 12.9  VTE prophylaxis - SCDs  No antithrombotic/anticoagulant prior to admission, now on heparin IV per stroke protocol.   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 Valeta Harms - OK for anticoagulation  Discussed with Dr. Sherlon Handing - I feel pt is poor candidate for further intervention given current medical condition  on heparin IV    Cerebral Edema Uncal herniation s/p [facility] DI  CT repeat 5/28 showed MLS worsened to 8-68mm  Exam concerning for early  uncal herniation on the right  S/p [facility] with Dr. Venetia Maxon (NSG)  CT repeat 5/29 - resolved MLS  On HTS at 75cc/hr -> 1/2NS @ 50 -> NS @ 30->FW  Na 154->156->160->155->158->167->170->163  CT head 5/31 Right greater than left hemorrhagic infarcts are stable from the MRI yesterday. Intracranial mass effect stable from yesterday, with leftward midline shift at the anterior septum pellucidum up to 6 mm and effaced suprasellar cistern.   CT head 6/1 no new bleeding or infarct but increased cerebral edema.  CT head repeat 6/1 worsening cerebral edema with disappearing basal cisterns  DI with urine specific gravity 1.004 -> put on DDAVP and free water replenish   Discussed with husband yesterday and he wants to continue current therapy. He will be here this pm  Clinical brain death  08/24/22 lost all brainstem reflexes, no movement on pain  Clinically brain dead  Will do brain flow study to confirm  Probable seizure  EEG 5/27 potential seizure focus on the right  EEG 5/30 epileptogenicity arising from right frontocentral region with high potential for seizures.    On Keppra 500mg  bid  Seizure precautions  Diabetes, uncontrolled   Home meds:  Metformin 500mg  daily  HgbA1c 12.9, goal < 7.0  CBGs  Hyperglycemia with TF  Put on levemir 10U ->20U bid   SSI Q4h  Diabetes coordinator consult  Hypertension  Home meds:  none  Stable . SBP goal < 160 due to hemorrhagic conversion . Long-term BP goal normotensive  Hyperlipidemia  Home meds:  none  LDL 197 goal < 70  On lipitor 80  Continue statin on discharge  Leukocytosis Fever, ? central   WBC 20->17.5->16.4->21.3->17.4->16.5->13.8->14.4  Tmax 99.9->100->101.2->100.6->101.9->100  UA neg  CXR neg  Blood culture NGTD  Severe anemia with acute blood loss  Baseline anemia   This admission, Hb 8.5->7.5->6.8->6.2->PRBC->7.7->7.4->7.2->7.5  S/p PRBC x 1  Continue heparin IV for now  Other Stroke Risk  Factors    Other Active Problems  Thrombocytosis platelet 715->524->400->286->291->257->326  Hospital day #7  This patient is critically ill due to brain herniation, cerebral edema, seizure, severe anemia, hyperglycemia and at significant risk of neurological worsening, death form brain death. This patient's care requires constant monitoring of vital signs, hemodynamics, respiratory and cardiac monitoring, review  of multiple databases, neurological assessment, discussion with family, other specialists and medical decision making of high complexity. I spent 35 minutes of neurocritical care time in the care of this patient. I discussed with Dr. Rodman Key, MD PhD Stroke Neurology 09/04/2020 12:22 PM   To contact Stroke Continuity provider, please refer to [email or web address]. After hours, contact General Neurology

## 2020-09-12 NOTE — Significant Event (Signed)
Adult Brain Death Determination  Time of Examination: 2020/08/17 2:42 PM  A. No Evidence of /Cause of Reversible CNS Depression  1. Core temperature must be greater >36 degrees. Last temp: Temp: 99.1 F (37.3 C) (Note: If unable to achieve normothermia after 12 hours of temperature management, may consider proceeding with Brain Death Evaluation.):    yes  2. Evidence of severe metabolic perturbations that could potentate CNS depression. Consider glucose, Na, creatinine, PaCO2, SaO2.:    Present  Hypernatremia 166  3. Evidence of drugs, by history or measurement, that could potentiate central nervous system depression: narcotics, ethanol, benzodiazepines, barbiturates, neuromuscular blockade.:     Absent  B. Absence of Cortical Function  1. GCS = 3:    yes  C. Absence of Brain Stem Reflexes and Responses  1. Pupils light-fixed    yes  2. Corneal reflexes:    Absent  3. Response to upper and lower airway stimulation, such as pharyngeal and endotracheal suctioning.:    Absent  4. Ocular response to head turning (eye movement).    Absent  D. Absence of Spontaneous Respirations  (Apnea test performed per Brain Death Policy. If not met due to hemodynamic/ventilatory instability, then perform EEG, TCD, or cerebral blow flow studies.)  1.   Spontaneous Respirations   Absent  2.  Formal apnea testing forgone given confounder and need to proceed to confirmatory testing.  E. Document Confirmatory Test Utilized: (Optional) Nuclear cerebral flow, cerebral angiography (CT/MR angio), transcranial Doppler ultrasound, EEG, SSEP (record results).  1. Test results (if available):  Cerebral blood flow is absent consistent with brain death.  Patient pronounced dead by neurological criteria at 11:44 AM on August 17, 2020.  Kipp Brood, MD 2020/08/17 2:42 PM

## 2020-09-12 NOTE — Progress Notes (Signed)
Inpatient Diabetes Program Recommendations  AACE/ADA: New Consensus Statement on Inpatient Glycemic Control   Target Ranges:  Prepandial:   less than 140 mg/dL      Peak postprandial:   less than 180 mg/dL (1-2 hours)      Critically ill patients:  140 - 180 mg/dL   Results for Regina Morris, Regina Morris (MRN [id number]) as of 08/19/2020 08:20  Ref. Range 08/13/2020 08:11 08/13/2020 11:57 08/13/2020 15:36 08/13/2020 20:11 08/13/2020 23:40 09/07/2020 03:50 08/26/2020 08:02  Glucose-Capillary Latest Ref Range: 70 - 99 mg/dL 299 (H) 371 (H) 696 (H) 149 (H) 212 (H) 279 (H) 373 (H)   Review of Glycemic Control  Current orders for Inpatient glycemic control: Leveimr 20 units BID, Novolog 6 units Q4H, Novolog 0-15 units Q4H; Jevity @ 55 ml/hr  Inpatient Diabetes Program Recommendations:    Insulin: If appropriate given patient situation, may want to consider increasing Levemir to 25 units BID.  NOTE: Noted documentation of worsening decline and possible brain death overnight. Glucose up to 373 mg/dl this morning.   Thanks, Orlando Penner, RN, MSN, CDE Diabetes Coordinator Inpatient Diabetes Program [phone] (Team Pager from 8am to 5pm)

## 2020-09-12 NOTE — Progress Notes (Signed)
Post recruitment ABG showed critical pCO2 levals. Rate changed from 15 to 20 per doner services. Repeat abg scheduled for one hour.

## 2020-09-12 DEATH — deceased
